# Patient Record
Sex: Male | Born: 1950 | Race: White | Hispanic: No | Marital: Married | Smoking: Never smoker
Health system: Southern US, Community
[De-identification: ages and names within clinical notes are randomized; demographics above are authoritative.]

## PROBLEM LIST (undated history)

## (undated) DIAGNOSIS — C439 Malignant melanoma of skin, unspecified: Secondary | ICD-10-CM

## (undated) DIAGNOSIS — Z9889 Other specified postprocedural states: Secondary | ICD-10-CM

## (undated) DIAGNOSIS — I219 Acute myocardial infarction, unspecified: Secondary | ICD-10-CM

## (undated) DIAGNOSIS — T4145XA Adverse effect of unspecified anesthetic, initial encounter: Secondary | ICD-10-CM

## (undated) DIAGNOSIS — K5730 Diverticulosis of large intestine without perforation or abscess without bleeding: Secondary | ICD-10-CM

## (undated) DIAGNOSIS — E785 Hyperlipidemia, unspecified: Secondary | ICD-10-CM

## (undated) DIAGNOSIS — I2510 Atherosclerotic heart disease of native coronary artery without angina pectoris: Secondary | ICD-10-CM

## (undated) DIAGNOSIS — K219 Gastro-esophageal reflux disease without esophagitis: Secondary | ICD-10-CM

## (undated) DIAGNOSIS — I10 Essential (primary) hypertension: Secondary | ICD-10-CM

## (undated) DIAGNOSIS — R112 Nausea with vomiting, unspecified: Secondary | ICD-10-CM

## (undated) DIAGNOSIS — T8859XA Other complications of anesthesia, initial encounter: Secondary | ICD-10-CM

## (undated) DIAGNOSIS — IMO0001 Reserved for inherently not codable concepts without codable children: Secondary | ICD-10-CM

## (undated) HISTORY — PX: MELANOMA EXCISION: SHX5266

## (undated) HISTORY — DX: Essential (primary) hypertension: I10

## (undated) HISTORY — DX: Atherosclerotic heart disease of native coronary artery without angina pectoris: I25.10

## (undated) HISTORY — DX: Hyperlipidemia, unspecified: E78.5

## (undated) HISTORY — DX: Malignant melanoma of skin, unspecified: C43.9

## (undated) HISTORY — PX: OTHER SURGICAL HISTORY: SHX169

## (undated) HISTORY — DX: Diverticulosis of large intestine without perforation or abscess without bleeding: K57.30

## (undated) HISTORY — DX: Gastro-esophageal reflux disease without esophagitis: K21.9

## (undated) HISTORY — DX: Acute myocardial infarction, unspecified: I21.9

## (undated) HISTORY — PX: COLONOSCOPY: SHX174

---

## 2000-11-22 ENCOUNTER — Emergency Department (HOSPITAL_COMMUNITY): Admission: EM | Admit: 2000-11-22 | Discharge: 2000-11-22 | Payer: Self-pay

## 2002-12-03 DIAGNOSIS — I219 Acute myocardial infarction, unspecified: Secondary | ICD-10-CM

## 2002-12-03 HISTORY — PX: PTCA: SHX146

## 2002-12-03 HISTORY — DX: Acute myocardial infarction, unspecified: I21.9

## 2003-06-21 ENCOUNTER — Encounter (HOSPITAL_COMMUNITY): Admission: RE | Admit: 2003-06-21 | Discharge: 2003-09-19 | Payer: Self-pay | Admitting: Cardiology

## 2004-10-20 ENCOUNTER — Ambulatory Visit: Payer: Self-pay

## 2004-10-25 ENCOUNTER — Ambulatory Visit: Payer: Self-pay | Admitting: Cardiology

## 2005-04-17 ENCOUNTER — Ambulatory Visit: Payer: Self-pay | Admitting: Cardiology

## 2005-04-23 ENCOUNTER — Ambulatory Visit: Payer: Self-pay | Admitting: Cardiology

## 2005-07-18 ENCOUNTER — Ambulatory Visit: Payer: Self-pay

## 2005-07-23 ENCOUNTER — Ambulatory Visit: Payer: Self-pay | Admitting: Cardiology

## 2005-08-03 ENCOUNTER — Ambulatory Visit: Payer: Self-pay | Admitting: Cardiology

## 2006-01-23 ENCOUNTER — Ambulatory Visit: Payer: Self-pay

## 2006-02-14 ENCOUNTER — Ambulatory Visit: Payer: Self-pay | Admitting: Cardiology

## 2006-04-25 ENCOUNTER — Ambulatory Visit: Payer: Self-pay | Admitting: Pulmonary Disease

## 2006-05-14 ENCOUNTER — Ambulatory Visit: Payer: Self-pay | Admitting: Gastroenterology

## 2006-08-22 ENCOUNTER — Ambulatory Visit: Payer: Self-pay | Admitting: Cardiology

## 2006-08-28 ENCOUNTER — Ambulatory Visit: Payer: Self-pay | Admitting: Cardiology

## 2007-08-08 ENCOUNTER — Ambulatory Visit: Payer: Self-pay | Admitting: Cardiology

## 2007-08-08 LAB — CONVERTED CEMR LAB
AST: 21 units/L (ref 0–37)
Bilirubin, Direct: 0.1 mg/dL (ref 0.0–0.3)
Chloride: 104 meq/L (ref 96–112)
Cholesterol: 113 mg/dL (ref 0–200)
Creatinine, Ser: 1 mg/dL (ref 0.4–1.5)
GFR calc non Af Amer: 82 mL/min
Glucose, Bld: 98 mg/dL (ref 70–99)
HDL: 40.5 mg/dL (ref >39.0)
LDL Cholesterol: 61 mg/dL (ref 0–99)
Sodium: 139 meq/L (ref 135–145)
Total Bilirubin: 1 mg/dL (ref 0.3–1.2)
Total Protein: 6.9 g/dL (ref 6.0–8.3)

## 2007-08-12 ENCOUNTER — Ambulatory Visit: Payer: Self-pay | Admitting: Cardiology

## 2007-10-15 ENCOUNTER — Ambulatory Visit: Payer: Self-pay | Admitting: Pulmonary Disease

## 2007-10-15 LAB — CONVERTED CEMR LAB
AST: 25 units/L (ref 0–37)
Basophils Relative: 0.6 % (ref 0.0–1.0)
Bilirubin, Direct: 0.1 mg/dL (ref 0.0–0.3)
CO2: 32 meq/L (ref 19–32)
Chloride: 106 meq/L (ref 96–112)
Cholesterol: 190 mg/dL (ref 0–200)
Eosinophils Absolute: 0.2 10*3/uL (ref 0.0–0.6)
Eosinophils Relative: 3.2 % (ref 0.0–5.0)
GFR calc Af Amer: 111 mL/min
GFR calc non Af Amer: 92 mL/min
Glucose, Bld: 107 mg/dL — ABNORMAL HIGH (ref 70–99)
HCT: 46.3 % (ref 39.0–52.0)
Leukocytes, UA: NEGATIVE
Lymphocytes Relative: 27.1 % (ref 12.0–46.0)
MCV: 85.6 fL (ref 78.0–100.0)
Neutro Abs: 4.2 10*3/uL (ref 1.4–7.7)
Neutrophils Relative %: 60.4 % (ref 43.0–77.0)
Nitrite: NEGATIVE
PSA: 1.28 ng/mL (ref 0.10–4.00)
Sodium: 143 meq/L (ref 135–145)
Total Protein: 7.5 g/dL (ref 6.0–8.3)
Urobilinogen, UA: 0.2 (ref 0.0–1.0)
WBC: 6.8 10*3/uL (ref 4.5–10.5)

## 2008-04-12 ENCOUNTER — Telehealth: Payer: Self-pay | Admitting: Pulmonary Disease

## 2008-04-29 ENCOUNTER — Ambulatory Visit: Payer: Self-pay | Admitting: Cardiology

## 2008-04-29 LAB — CONVERTED CEMR LAB
ALT: 18 units/L (ref 0–53)
AST: 22 units/L (ref 0–37)
Alkaline Phosphatase: 60 units/L (ref 39–117)
Bilirubin, Direct: 0.1 mg/dL (ref 0.0–0.3)
CO2: 30 meq/L (ref 19–32)
Chloride: 104 meq/L (ref 96–112)
Creatinine, Ser: 1.1 mg/dL (ref 0.4–1.5)
GFR calc non Af Amer: 73 mL/min
LDL Cholesterol: 67 mg/dL (ref 0–99)
Potassium: 4.2 meq/L (ref 3.5–5.1)
Sodium: 141 meq/L (ref 135–145)
Total Bilirubin: 1 mg/dL (ref 0.3–1.2)
Total CHOL/HDL Ratio: 3.1
Triglycerides: 50 mg/dL (ref 0–149)

## 2008-05-04 ENCOUNTER — Ambulatory Visit: Payer: Self-pay | Admitting: Cardiology

## 2008-07-13 ENCOUNTER — Ambulatory Visit: Payer: Self-pay | Admitting: Cardiology

## 2008-07-13 LAB — CONVERTED CEMR LAB
ALT: 21 units/L (ref 0–53)
Bilirubin, Direct: 0.1 mg/dL (ref 0.0–0.3)
HDL: 45.6 mg/dL (ref >39.0)
LDL Cholesterol: 68 mg/dL (ref 0–99)
Total Bilirubin: 0.9 mg/dL (ref 0.3–1.2)
Total CHOL/HDL Ratio: 2.7
VLDL: 9 mg/dL (ref 0–40)

## 2009-05-05 ENCOUNTER — Ambulatory Visit: Payer: Self-pay | Admitting: Cardiology

## 2009-05-05 LAB — CONVERTED CEMR LAB
ALT: 18 units/L (ref 0–53)
Alkaline Phosphatase: 66 units/L (ref 39–117)
Bilirubin, Direct: 0.2 mg/dL (ref 0.0–0.3)
LDL Cholesterol: 47 mg/dL (ref 0–99)
Total Bilirubin: 1.2 mg/dL (ref 0.3–1.2)
Total CHOL/HDL Ratio: 2
Total Protein: 7.3 g/dL (ref 6.0–8.3)
Triglycerides: 50 mg/dL (ref 0.0–149.0)

## 2009-05-07 DIAGNOSIS — Z85820 Personal history of malignant melanoma of skin: Secondary | ICD-10-CM | POA: Insufficient documentation

## 2009-05-07 DIAGNOSIS — I10 Essential (primary) hypertension: Secondary | ICD-10-CM | POA: Insufficient documentation

## 2009-05-07 DIAGNOSIS — I2510 Atherosclerotic heart disease of native coronary artery without angina pectoris: Secondary | ICD-10-CM | POA: Insufficient documentation

## 2009-05-09 ENCOUNTER — Ambulatory Visit: Payer: Self-pay | Admitting: Cardiology

## 2010-07-26 ENCOUNTER — Ambulatory Visit: Payer: Self-pay | Admitting: Cardiology

## 2010-07-27 LAB — CONVERTED CEMR LAB
AST: 27 units/L (ref 0–37)
Albumin: 3.7 g/dL (ref 3.5–5.2)
Alkaline Phosphatase: 67 units/L (ref 39–117)
CO2: 29 meq/L (ref 19–32)
Calcium: 9.2 mg/dL (ref 8.4–10.5)
Chloride: 107 meq/L (ref 96–112)
Cholesterol: 144 mg/dL (ref 0–200)
Creatinine, Ser: 0.9 mg/dL (ref 0.4–1.5)
HDL: 48.2 mg/dL (ref >39.00)
LDL Cholesterol: 86 mg/dL (ref 0–99)
Sodium: 143 meq/L (ref 135–145)
Total CHOL/HDL Ratio: 3
Total Protein: 6.8 g/dL (ref 6.0–8.3)
Triglycerides: 47 mg/dL (ref 0.0–149.0)

## 2010-08-01 LAB — CONVERTED CEMR LAB: PSA: 1.43 ng/mL (ref 0.10–4.00)

## 2010-08-02 ENCOUNTER — Ambulatory Visit: Payer: Self-pay | Admitting: Cardiology

## 2010-08-21 ENCOUNTER — Telehealth (INDEPENDENT_AMBULATORY_CARE_PROVIDER_SITE_OTHER): Payer: Self-pay | Admitting: *Deleted

## 2010-08-21 DIAGNOSIS — Z87448 Personal history of other diseases of urinary system: Secondary | ICD-10-CM | POA: Insufficient documentation

## 2010-08-22 ENCOUNTER — Telehealth (INDEPENDENT_AMBULATORY_CARE_PROVIDER_SITE_OTHER): Payer: Self-pay | Admitting: *Deleted

## 2010-08-23 ENCOUNTER — Encounter: Payer: Self-pay | Admitting: Pulmonary Disease

## 2010-08-31 ENCOUNTER — Ambulatory Visit: Payer: Self-pay | Admitting: Cardiology

## 2010-08-31 ENCOUNTER — Ambulatory Visit: Payer: Self-pay

## 2010-10-31 ENCOUNTER — Encounter: Payer: Self-pay | Admitting: Gastroenterology

## 2011-01-02 NOTE — Assessment & Plan Note (Signed)
Summary: f1y per pt call/lg      Allergies Added: NKDA  Visit Type:  Follow-up Primary Provider:  Dr. Kriste Basque  CC:  CAD.  History of Present Illness: The patient presents for one year follow up.  Since I last saw him he has had no new cardiovascular complaints.  He is not exercising as much as I would like.  However, with his usual activity he denies chest pain, neck or arm discomfort.  He has no SOB, PND or orthopnea.  He has had no swelling or weight gain.    Current Medications (verified): 1)  Lipitor 20 Mg Tabs (Atorvastatin Calcium) .... 1/2 By Mouth Daily 2)  Aspirin 325 Mg Tabs (Aspirin) .... Take One Tablet  By Mouth Once Daily 3)  Ramipril 2.5 Mg Caps (Ramipril) .... Take One Tablet By Mouth Once Daily 4)  Niaspan 1000 Mg Cr-Tabs (Niacin (Antihyperlipidemic)) .Marland Kitchen.. 1 1/2 Daily  Allergies (verified): No Known Drug Allergies  Past History:  Past Medical History: Reviewed history from 05/07/2009 and no changes required. DIVERTICULOSIS, COLON, HX OF (ICD-V12.79) MYOCARDIAL INFARCTION, HX OF (ICD-412) HYPERTENSION (ICD-401.9) MELANOMA, HX OF (ICD-V10.82) DYSLIPIDEMIA (ICD-272.4) CORONARY ARTERY DISEASE (ICD-414.00) (occluded right coronary artery at     [facility] in 2004 treated with stenting of the right     coronary artery with a downstream 75% stenosis as well.  He had two     drug-eluting stents placed.  Circumflex had 25% stenosis).     Past Surgical History: Reviewed history from 05/07/2009 and no changes required. PERCUTANEOUS TRANSLUMINAL CORONARY ANGIOPLASTY, HX OF (ICD-V45.82)  Review of Systems       As stated in the HPI and negative for all other systems.   Vital Signs:  Patient profile:   60 year old male Height:      71 inches Weight:      190 pounds BMI:     26.60 Pulse rate:   69 / minute Resp:     16 per minute BP sitting:   100 / 66  (right arm)  Vitals Entered By: Marrion Coy, CNA (August 02, 2010 12:12 PM)  Physical  Exam  General:  Well developed, well nourished, in no acute distress. Head:  normocephalic and atraumatic Mouth:  Teeth, gums and palate normal. Oral mucosa normal. Neck:  Neck supple, no JVD. No masses, thyromegaly or abnormal cervical nodes. Chest Wall:  no deformities or breast masses noted Lungs:  Clear bilaterally to auscultation and percussion. Abdomen:  Bowel sounds positive; abdomen soft and non-tender without masses, organomegaly, or hernias noted. No hepatosplenomegaly. Msk:  Back normal, normal gait. Muscle strength and tone normal. Extremities:  No clubbing or cyanosis. Neurologic:  Alert and oriented x 3. Skin:  Intact without lesions or rashes. Cervical Nodes:  no significant adenopathy Axillary Nodes:  no significant adenopathy Inguinal Nodes:  no significant adenopathy Psych:  Normal affect.   Detailed Cardiovascular Exam  Neck    Carotids: Carotids full and equal bilaterally without bruits.      Neck Veins: Normal, no JVD.  Normal, no JVD.    Heart    Inspection: no deformities or lifts noted.  no deformities or lifts noted.      Palpation: normal PMI with no thrills palpable.  normal PMI with no thrills palpable.      Auscultation: regular rate and rhythm, S1, S2 without murmurs, rubs, gallops, or clicks.  regular rate and rhythm, S1, S2 without murmurs, rubs, gallops, or clicks.    Vascular  Abdominal Aorta: no palpable masses, pulsations, or audible bruits.  no palpable masses, pulsations, or audible bruits.      Femoral Pulses: normal femoral pulses bilaterally.  normal femoral pulses bilaterally.      Pedal Pulses: normal pedal pulses bilaterally.  normal pedal pulses bilaterally.      Radial Pulses: normal radial pulses bilaterally.  normal radial pulses bilaterally.      Peripheral Circulation: no clubbing, cyanosis, or edema noted with normal capillary refill.  no clubbing, cyanosis, or edema noted with normal capillary refill.     EKG  Procedure  date:  08/02/2010  Findings:      Normal sinus rhythm, No acute ST T wave changes.  Impression & Recommendations:  Problem # 1:  MYOCARDIAL INFARCTION, HX OF (ICD-412) It has been several years since his MI.  He is not exercising as  much as I would like.  I would like to screen him with an exercise treadmill test.  He will continue with risk reduction. Orders: EKG w/ Interpretation (93000) Treadmill (Treadmill)  Problem # 2:  HYPERTENSION (ICD-401.9) His blood pressure is controlled.  He will continue with the meds as listed.  Problem # 3:  DYSLIPIDEMIA (ICD-272.4) He had an excellent lipid profile.  He will continue with the meds as listed.  Patient Instructions: 1)  Your physician recommends that you schedule a follow-up appointment at the time of your treadmill 2)  Your physician recommends that you continue on your current medications as directed. Please refer to the Current Medication list given to you today. 3)  Your physician has requested that you have an exercise tolerance test.  For further information please visit [email or web address]  Please also follow instruction sheet, as given.

## 2011-01-02 NOTE — Letter (Signed)
Summary: Colonoscopy Letter  [facility]  [address]   [place], [place] [address]   Phone: [phone]  Fax: [phone]      October 31, 2010 MRN: [id number]   Juan [name] [address] [place], [place]  [address]   Dear [name],   According to your medical record, it is time for you to schedule a Colonoscopy. The American Cancer Society recommends this procedure as a method to detect early colon cancer. Patients with a family history of colon cancer, or a personal history of colon polyps or inflammatory bowel disease are at increased risk.  This letter has been generated based on the recommendations made at the time of your procedure. If you feel that in your particular situation this may no longer apply, please contact our office.  Please call our office at [phone] to schedule this appointment or to update your records at your earliest convenience.  Thank you for cooperating with [place] to provide you with the very best care possible.   Sincerely,   Barbette Hair. Arlyce Dice, M.D.  [facility] [phone]

## 2011-01-02 NOTE — Miscellaneous (Signed)
Summary: Orders Update  Clinical Lists Changes  Problems: Added new problem of SCREENING OTHER&UNSPEC GENITOURINARY CONDITION (ICD-V81.6) Orders: Added new Test order of TLB-PSA (Prostate Specific Antigen) (84153-PSA) - Signed

## 2011-01-02 NOTE — Letter (Signed)
Summary: [facility] - Lip[facility] Main [facility]  [address]   [place], [place] [address]   Phone: [phone]  Fax: [phone]         August 02, 2010 MRN: [id number]     Juan [name] [address] [place], [place]  [address]     Dear Juan [name],  We have reviewed your cholesterol results.  They are as follows:     Total Cholesterol:    144 (Desirable: less than 200)       HDL  Cholesterol:     48.20  (Desirable: greater than 40 for men and 50 for women)       LDL Cholesterol:       86  (Desirable: less than 100 for low risk and less than 70 for moderate to high risk)       Triglycerides:       47.0  (Desirable: less than 150)  Our recommendations include: No changes, copntinue current medications   Call our office at the number listed above if you have any questions.  Lowering your LDL cholesterol is important, but it is only one of a large number of "risk factors" that may indicate that you are at risk for heart disease, stroke or other complications of hardening of the arteries.  Other risk factors include:   A.  Cigarette Smoking* B.  High Blood Pressure* C.  Obesity* D.   Low HDL Cholesterol (see yours above)* E.   Diabetes Mellitus (higher risk if your is uncontrolled) F.  Family history of premature heart disease G.  Previous history of stroke or cardiovascular disease          *These are risk factors YOU HAVE CONTROL OVER.  For more information, visit .  There is now evidence that lowering the TOTAL CHOLESTEROL AND LDL CHOLESTEROL can reduce the risk of heart disease.  The American Heart Association recommends the following guidelines for the treatment of elevated cholesterol:  1.  If there is now current heart disease and less than two risk factors, TOTAL CHOLESTEROL should be less than 200 and LDL CHOLESTEROL should be less than 100. 2.  If there is current heart disease or two or more risk factors, TOTAL CHOLESTEROL should be  less than 200 and LDL CHOLESTEROL should be less than 70.  A diet low in cholesterol, saturated fat, and calories is the cornerstone of treatment for elevated cholesterol.  Cessation of smoking and exercise are also important in the management of elevated cholesterol and preventing vascular disease.  Studies have shown that 30 to 60 minutes of physical activity most days can help lower blood pressure, lower cholesterol, and keep your weight at a healthy level.  Drug therapy is used when cholesterol levels do not respond to therapeutic lifestyle changes (smoking cessation, diet, and exercise) and remains unacceptably high.  If medication is started, it is important to have you levels checked periodically to evaluate the need for further treatment options.      Thank you,     Sander Nephew, RN for Dr Rollene Rotunda [facility] Team

## 2011-01-02 NOTE — Letter (Signed)
Summary: [facility]  [facility]   Imported By: Lennie Odor 08/30/2010 14:28:19  _____________________________________________________________________  External Attachment:    Type:   Image     Comment:   External Document

## 2011-01-02 NOTE — Progress Notes (Signed)
Summary: urolgy referral/ change request  Phone Note Call from Patient   Caller: Patient Call For: Juan [name] Summary of Call: pt request to change dr's re: recent referral to dr Vonita Moss at [facility] urology. since he hasn't been seen yet (appt is this fri at 11:00am) he would like to see dr humphries since his wife has seen him. pt # [id number] Initial call taken by: Tivis Ringer, CNA,  August 22, 2010 9:33 AM  Follow-up for Phone Call        called and spoke with pt.  order was sent yesterday to [facility] for Urology referral.  Pt requesting a different physician than the one he currently has an appt to see (Dr. Vonita Moss) .  Will forward message to [facility] to see if pt can be scheduled with Dr. Wanda Plump instead.  Aundra Millet Reynolds LPN  August 22, 2010 9:40 AM   Additional Follow-up for Phone Call Additional follow up Details #1::        dr humphries no longer in practice so pt chose to stay with his appt to see dr Vonita Moss 08/23/10@11 :00am  Additional Follow-up by: Oneita Jolly,  August 22, 2010 9:59 AM

## 2011-01-02 NOTE — Progress Notes (Signed)
Summary: urology referral  Phone Note Call from Patient Call back at [phone]   Caller: Patient Call For: nadel Summary of Call: pt would like referral to see urologist Initial call taken by: Rickard Patience,  August 21, 2010 9:54 AM  Follow-up for Phone Call        Pt last seen SN 11.2008  He is requesting urology referral d/t having blood in urine over the weekend.  States since Sunday morning he has not had anymore blood.  Blood was accompanied with slight burning but no pain, f/c/s.  Dr. Kriste Basque, pls advise.  Thanks!  Follow-up by: Gweneth Dimitri RN,  August 21, 2010 10:38 AM  Additional Follow-up for Phone Call Additional follow up Details #1::        per SN---ok to refer to urology....order has been placed in emr for this. Randell Loop CMA  August 21, 2010 3:11 PM   New Problems: HEMATURIA, HX OF (ICD-V13.09)   Additional Follow-up for Phone Call Additional follow up Details #2::    called and spoke with pt.  pt aware SN sent order to [facility] for Urology referall and that someone from our office will be contacting him about an appt date and time with a Urologist. Arman Filter LPN  August 21, 2010 3:20 PM   New Problems: HEMATURIA, HX OF (ICD-V13.09)

## 2011-01-15 ENCOUNTER — Telehealth: Payer: Self-pay | Admitting: Cardiology

## 2011-01-16 ENCOUNTER — Encounter: Payer: Self-pay | Admitting: Physician Assistant

## 2011-01-16 ENCOUNTER — Ambulatory Visit (INDEPENDENT_AMBULATORY_CARE_PROVIDER_SITE_OTHER): Payer: BC Managed Care – PPO | Admitting: Physician Assistant

## 2011-01-16 ENCOUNTER — Observation Stay (HOSPITAL_COMMUNITY)
Admission: AD | Admit: 2011-01-16 | Discharge: 2011-01-17 | DRG: 854 | Disposition: A | Discharge status: Home / Self Care | Payer: BC Managed Care – PPO | Source: Ambulatory Visit | Attending: Cardiovascular Disease | Admitting: Cardiovascular Disease

## 2011-01-16 DIAGNOSIS — I252 Old myocardial infarction: Secondary | ICD-10-CM

## 2011-01-16 DIAGNOSIS — I2510 Atherosclerotic heart disease of native coronary artery without angina pectoris: Secondary | ICD-10-CM

## 2011-01-16 DIAGNOSIS — I200 Unstable angina: Secondary | ICD-10-CM | POA: Diagnosis present

## 2011-01-16 DIAGNOSIS — K5730 Diverticulosis of large intestine without perforation or abscess without bleeding: Secondary | ICD-10-CM | POA: Diagnosis present

## 2011-01-16 DIAGNOSIS — I10 Essential (primary) hypertension: Secondary | ICD-10-CM | POA: Diagnosis present

## 2011-01-16 DIAGNOSIS — Z9861 Coronary angioplasty status: Secondary | ICD-10-CM

## 2011-01-16 DIAGNOSIS — Z01810 Encounter for preprocedural cardiovascular examination: Secondary | ICD-10-CM | POA: Insufficient documentation

## 2011-01-16 DIAGNOSIS — M79609 Pain in unspecified limb: Secondary | ICD-10-CM

## 2011-01-16 DIAGNOSIS — E785 Hyperlipidemia, unspecified: Secondary | ICD-10-CM | POA: Diagnosis present

## 2011-01-16 DIAGNOSIS — Z01812 Encounter for preprocedural laboratory examination: Secondary | ICD-10-CM | POA: Insufficient documentation

## 2011-01-16 DIAGNOSIS — Z7982 Long term (current) use of aspirin: Secondary | ICD-10-CM

## 2011-01-16 LAB — COMPREHENSIVE METABOLIC PANEL
ALT: 19 U/L (ref 0–53)
BUN: 11 mg/dL (ref 6–23)
BUN: 10 mg/dL (ref 6–23)
CO2: 26 mEq/L (ref 19–32)
CO2: 28 mEq/L (ref 19–32)
Calcium: 8.1 mg/dL — ABNORMAL LOW (ref 8.4–10.5)
Chloride: 106 mEq/L (ref 96–112)
Creatinine, Ser: 0.99 mg/dL (ref 0.4–1.5)
GFR calc non Af Amer: >60 mL/min (ref >60)
GFR calc non Af Amer: >60 mL/min (ref >60)
Glucose, Bld: 98 mg/dL (ref 70–99)
Sodium: 135 mEq/L (ref 135–145)
Total Bilirubin: 0.6 mg/dL (ref 0.3–1.2)

## 2011-01-16 LAB — PROTIME-INR
INR: 7.07 (ref 0.00–1.49)
INR: 1.23 (ref 0.00–1.49)
Prothrombin Time: 60.3 seconds — ABNORMAL HIGH (ref 11.6–15.2)

## 2011-01-16 LAB — CBC
HCT: 38.9 % — ABNORMAL LOW (ref 39.0–52.0)
HCT: 40.1 % (ref 39.0–52.0)
Hemoglobin: 12.9 g/dL — ABNORMAL LOW (ref 13.0–17.0)
MCHC: 33.2 g/dL (ref 30.0–36.0)
Platelets: 245 10*3/uL (ref 150–400)
RBC: 4.76 MIL/uL (ref 4.22–5.81)
RDW: 13.6 % (ref 11.5–15.5)
RDW: 13.6 % (ref 11.5–15.5)
WBC: 8.5 10*3/uL (ref 4.0–10.5)
WBC: 10.1 10*3/uL (ref 4.0–10.5)

## 2011-01-16 LAB — CARDIAC PANEL(CRET KIN+CKTOT+MB+TROPI)
CK, MB: 1.3 ng/mL (ref 0.3–4.0)
Relative Index: INVALID (ref 0.0–2.5)
Troponin I: 0.01 ng/mL (ref 0.00–0.06)
Troponin I: 0.01 ng/mL (ref 0.00–0.06)

## 2011-01-16 LAB — POCT ACTIVATED CLOTTING TIME: Activated Clotting Time: 476 seconds

## 2011-01-16 LAB — MRSA PCR SCREENING: MRSA by PCR: NEGATIVE

## 2011-01-17 ENCOUNTER — Inpatient Hospital Stay (HOSPITAL_COMMUNITY): Payer: BC Managed Care – PPO

## 2011-01-17 DIAGNOSIS — I200 Unstable angina: Secondary | ICD-10-CM

## 2011-01-17 LAB — BASIC METABOLIC PANEL
BUN: 11 mg/dL (ref 6–23)
GFR calc Af Amer: >60 mL/min (ref >60)
GFR calc non Af Amer: >60 mL/min (ref >60)
Potassium: 3.9 mEq/L (ref 3.5–5.1)
Sodium: 143 mEq/L (ref 135–145)

## 2011-01-17 LAB — CARDIAC PANEL(CRET KIN+CKTOT+MB+TROPI)
CK, MB: 1.6 ng/mL (ref 0.3–4.0)
Total CK: 87 U/L (ref 7–232)
Troponin I: 0.05 ng/mL (ref 0.00–0.06)

## 2011-01-17 LAB — CBC
Platelets: 230 10*3/uL (ref 150–400)
RDW: 13.7 % (ref 11.5–15.5)
WBC: 9.3 10*3/uL (ref 4.0–10.5)

## 2011-01-17 LAB — LIPID PANEL: HDL: 34 mg/dL — ABNORMAL LOW (ref >39)

## 2011-01-18 NOTE — Procedures (Signed)
NAMEXZAVIER, SWINGER                 ACCOUNT NO.:  [id number]  MEDICAL RECORD NO.:  [id number]           PATIENT TYPE:  I  LOCATION:  2920                         FACILITY:  [facility]  PHYSICIAN:  Verne Carrow, MDDATE OF BIRTH:  04-12-1951  DATE OF PROCEDURE:  01/16/2011 DATE OF DISCHARGE:                           CARDIAC CATHETERIZATION   PRIMARY CARDIOLOGIST:  Rollene Rotunda, MD, [facility].  PRIMARY CARE PHYSICIAN:  Lonzo Cloud. Kriste Basque, MD  PROCEDURE PERFORMED: 1. Left heart catheterization. 2. Selective coronary angiography. 3. Left ventricular angiogram. 4. PTCA with placement of a single drug-eluting stent in the mid right     coronary artery.  OPERATOR:  Verne Carrow, MD.  ARTERIAL ACCESS SITE:  Right radial artery.  INDICATIONS:  This is a 60 year old Caucasian male with a history of coronary artery disease, who underwent placement of overlapping drug- eluting stents in the proximal and mid right coronary artery in 2004 at the time of an inferior myocardial infarction.  The patient also has a history of hypertension and dyslipidemia.  He presented to our office today with complaints of burning chest discomfort.  His EKG showed subtle ST-segment elevation in the inferior leads.  Based on the abnormal EKG, he was brought to the cardiac catheterization laboratory for an urgent cardiac catheterization.  DETAILS OF PROCEDURE:  The patient was brought to the main cardiac catheterization laboratory after signing informed consent for the procedure.  An Freida Busman test was performed on the right wrist and was positive.  The right wrist was prepped and draped in sterile fashion.  A 1% lidocaine was used for local anesthesia.  A 5-French sheath was inserted into the right radial artery without difficulty.  A 3 mg of verapamil was given through the sheath, 4000 units of intravenous heparin was given after sheath insertion.  Standard diagnostic catheters were used to perform  selective coronary angiography.  A pigtail catheter was used across the aortic valve into the left ventricle.  A left ventricular angiogram was performed in the RAO projection.  The patient tolerated the diagnostic portion of the procedure well.  We elected to proceed intervention of the severe stenosis in the mid right coronary artery.  The sheath was upsized to a 6-French sheath in the right radial artery.  The patient was given a bolus of Angiomax and a drip was started.  The patient was given 60 mg of Effient x1.  When the ACT was greater than 200, we engage the native right coronary artery with a 6- [place] JR-4 guiding catheter.  A Cougar intracoronary wire was passed down the length of the vessel into the distal vessel.  A 2.5 x 12-mm balloon was inflated in the area of tightest stenosis.  A 3.0 x 15-mm Endeavor drug-eluting stent was carefully positioned in the mid vessel and was deployed without difficulty.  A 3.25 x 12-mm noncompliant balloon was positioned inside the stent and was inflated to 14 atmospheres.  The stenosis was taken from 90% down to 0%.  There was excellent flow into the distal vessel at the conclusion of the case. There was no evidence of dissection of the vessel.  There were no immediate complications.  The patient had no chest pain at the conclusion of the case.  He was taken to the step-down ICU in stable condition.  Of note, the sheath was removed from the right radial artery and a Terumo hemostasis band was applied over the arteriotomy site.  HEMODYNAMIC FINDINGS:  Central aortic pressure, 105/60.  Left ventricular pressure, 102/7/12.  ANGIOGRAPHIC FINDINGS: 1. The left main coronary artery had no evidence of disease. 2. The left anterior descending was a large-caliber vessel that     coursed to the apex and gave off two moderate-sized diagonal     branches.  There were minor luminal irregularities in this vessel,     but no obstructive lesions. 3. The  circumflex artery gave off an early obtuse marginal branch that     had no disease.  There was a moderate-sized second obtuse marginal     branch with a tubular 50% stenosis. 4. The right coronary artery is a large dominant vessel with stents     present in the proximal mid vessel that are patent with no evidence     of restenosis.  Just beyond the stented segment and the mid vessel,     there is a discrete 90% hazy stenosis.  The distal vessel has no     obstructive disease. 5. Left ventricular angiogram was performed in the RAO projection     showed normal left ventricular systolic function with ejection     fraction of 60% to 65%.  IMPRESSION: 1. Single-vessel coronary artery disease. 2. Successful percutaneous transluminal coronary angioplasty with     placement of a drug-eluting stent in the mid right coronary artery. 3. Normal left ventricular systolic function.  RECOMMENDATIONS:  The patient should be continued on aspirin 81 mg once daily, Effient 10 mg once daily, beta-blocker, and a statin.  He will be watched closely in the step-down ICU tonight.  If he is stable, then we could consider discharge to home tomorrow.     Verne Carrow, MD     CM/MEDQ  D:  01/16/2011  T:  01/17/2011  Job:  [id number]  cc:   Rollene Rotunda, MD, [facility]. Kriste Basque, MD  Electronically Signed by Verne Carrow MD on 01/18/2011 11:08:35 AM

## 2011-01-22 ENCOUNTER — Telehealth: Payer: Self-pay | Admitting: Cardiology

## 2011-01-23 ENCOUNTER — Encounter: Payer: Self-pay | Admitting: Cardiology

## 2011-01-24 ENCOUNTER — Encounter: Payer: Self-pay | Admitting: Internal Medicine

## 2011-01-24 ENCOUNTER — Encounter: Payer: Self-pay | Admitting: Cardiology

## 2011-01-24 ENCOUNTER — Encounter (INDEPENDENT_AMBULATORY_CARE_PROVIDER_SITE_OTHER): Payer: BC Managed Care – PPO | Admitting: Cardiology

## 2011-01-24 DIAGNOSIS — I2510 Atherosclerotic heart disease of native coronary artery without angina pectoris: Secondary | ICD-10-CM

## 2011-01-24 DIAGNOSIS — E785 Hyperlipidemia, unspecified: Secondary | ICD-10-CM

## 2011-01-24 DIAGNOSIS — I10 Essential (primary) hypertension: Secondary | ICD-10-CM

## 2011-01-24 NOTE — Assessment & Plan Note (Signed)
Summary: per triage note/lwb      Allergies Added: NKDA  Visit Type:  Follow-up Primary Provider:  Dr. Kriste Basque  CC:  shoulder pain/ numbness in left arm.  History of Present Illness: Primary Cardiologist:  Dr. Rollene Rotunda  Juan [name] is a 60 yo male with a h/o CAD, s/p MI in 2004.  He had DEX x 2 to the RCA.  Residual stenosis included a 75% dRCA and 25% CFX.  His last ETT was 08/31/2010 and his EKGs demonstrated no ischemic changes.  He called in with arm pain and was added on to my schedule today.  Last week, he started to note some burning in his throat.  This would come on at any time.  It is often associated with meals.  He also had a strange sensation in his left arm.  He cannot really describe it.  He states it just felt like he was more aware that he had a left arm.  He woke in the middle of the night recently with some left arm numbness.  He felt like he had slept on his arm.  With changes in positioning this got better.  However, given his history, he was concerned and stayed awake for a couple of hours.  He denies exertional chest pain or shortness of breath.  He denies exertional throat symptoms.  He is actually having some throat burning in the office right now.  When he had his heart attack in 2004, he had throat burning-type symptoms.  They were much worse.  He denies syncope.  He does have significant dysphagia.  He has to induce vomiting at times.  He was told at one point that he should have an endoscopy.  However, he deferred  Preventive Screening-Counseling & Management  Alcohol-Tobacco     Smoking Status: never  Current Medications (verified): 1)  Lipitor 20 Mg Tabs (Atorvastatin Calcium) .... 1/2 By Mouth Daily 2)  Aspirin 325 Mg Tabs (Aspirin) .... Take One Tablet  By Mouth Once Daily 3)  Ramipril 2.5 Mg Caps (Ramipril) .... Take One Tablet By Mouth Once Daily 4)  Niaspan 1000 Mg Cr-Tabs (Niacin (Antihyperlipidemic)) .Marland Kitchen.. 1 1/2 Daily  Allergies (verified): No  Known Drug Allergies  Past History:  Past Medical History: Last updated: 05/07/2009 DIVERTICULOSIS, COLON, HX OF (ICD-V12.79) MYOCARDIAL INFARCTION, HX OF (ICD-412) HYPERTENSION (ICD-401.9) MELANOMA, HX OF (ICD-V10.82) DYSLIPIDEMIA (ICD-272.4) CORONARY ARTERY DISEASE (ICD-414.00) (occluded right coronary artery at     [facility] in 2004 treated with stenting of the right     coronary artery with a downstream 75% stenosis as well.  He had two     drug-eluting stents placed.  Circumflex had 25% stenosis).     Family History: Reviewed history from 05/07/2009 and no changes required. Father:Died at age 10 of MI Mother:mother hix of cva, hypertension Siblings: 1 brother has hypertension  Social History: Full Time Married  1 daughter Tobacco Use - No.  Smoking Status:  never  Review of Systems       As per  the HPI.  All other systems reviewed and negative.   Vital Signs:  Patient profile:   60 year old male Height:      71 inches Weight:      195 pounds BMI:     27.30 Pulse rate:   75 / minute BP sitting:   104 / 60  (left arm) Cuff size:   regular  Vitals Entered By: Burnett Kanaris, CNA (January 16, 2011 3:31 PM)  Physical Exam  General:  Well nourished, well developed in no acute distress HEENT: Normal Neck: No JVD Cardiac:  Normal S1, S2; RRR; no murmur Lungs:  Clear to auscultation bilaterally, no wheezing, rhonchi or rales Abd: Soft, nontender, no hepatomegaly Ext: No  edema Vascular: No carotid  bruits Skin: Warm and dry MSK:  No deformities Lymph: No adenopathy Endocrine:  No thyromegaly Neuro: CNs 2-12 intact; nonfocal    EKG  Procedure date:  01/16/2011  Findings:      normal sinus rhythm Heart rate 75 Less than or equal to 1 mm of ST elevation in leads 2, 3, aVF  Impression & Recommendations:  Problem # 1:  CORONARY ARTERY DISEASE (ICD-414.00) He is having atypical symptoms with throat burning and arm discomfort.  But,  his EKG is abnormal.  He has some slight ST elevation in his inferior leads (less than a millimeter).  I discussed his case with Dr. Excell Seltzer who also saw the patient.  His symptoms are more consistent with a GI etiology.  However given his prior intervention to his RCA and where his EKG changes are, we plan to admit him for further evaluation.  We will plan on sending him to the Cath Lab today for cardiac catheterization to further evaluate.  If his anatomy looks stable, we will likely need to refer him to gastroenterology.  Problem # 2:  UNSTABLE ANGINA (ICD-411.1)  As above, he will be admitted today for cardiac catheterization.  His updated medication list for this problem includes:    Aspirin 325 Mg Tabs (Aspirin) .Marland Kitchen... Take one tablet  by mouth once daily    Ramipril 2.5 Mg Caps (Ramipril) .Marland Kitchen... Take one tablet by mouth once daily  Problem # 3:  HYPERTENSION (ICD-401.9)  His updated medication list for this problem includes:    Aspirin 325 Mg Tabs (Aspirin) .Marland Kitchen... Take one tablet  by mouth once daily    Ramipril 2.5 Mg Caps (Ramipril) .Marland Kitchen... Take one tablet by mouth once daily  His updated medication list for this problem includes:    Aspirin 325 Mg Tabs (Aspirin) .Marland Kitchen... Take one tablet  by mouth once daily    Ramipril 2.5 Mg Caps (Ramipril) .Marland Kitchen... Take one tablet by mouth once daily  Problem # 4:  DYSLIPIDEMIA (ICD-272.4)  His updated medication list for this problem includes:    Lipitor 20 Mg Tabs (Atorvastatin calcium) .Marland Kitchen... 1/2 by mouth daily    Niaspan 1000 Mg Cr-tabs (Niacin (antihyperlipidemic)) .Marland Kitchen... 1 1/2 daily  His updated medication list for this problem includes:    Lipitor 20 Mg Tabs (Atorvastatin calcium) .Marland Kitchen... 1/2 by mouth daily    Niaspan 1000 Mg Cr-tabs (Niacin (antihyperlipidemic)) .Marland Kitchen... 1 1/2 daily

## 2011-01-24 NOTE — Discharge Summary (Addendum)
NAMEDONALDSON, RICHTER                 ACCOUNT NO.:  [id number]  MEDICAL RECORD NO.:  [id number]           PATIENT TYPE:  I  LOCATION:  2920                         FACILITY:  [facility]  PHYSICIAN:  Veverly Fells. Excell Seltzer, MD  DATE OF BIRTH:  June 05, 1951  DATE OF ADMISSION:  01/16/2011 DATE OF DISCHARGE:  01/17/2011                              DISCHARGE SUMMARY   DISCHARGE DIAGNOSES: 1. Unstable angina in the form of arm discomfort/throat burning with     cardiac catheterization this admission, January 16, 2011,     demonstrating patent prior right coronary artery stent with 90%     stenosis just beyond the mid vessel, status post successful     percutaneous transluminal coronary angioplasty/drug-eluting stent     placement to the stenosis. 2. Prior history of coronary artery disease status post myocardial     infarction in 2004 with stenting x2 to the right coronary artery     with drug-eluting stents at that time. 3. Hypertension, controlled. 4. Dyslipidemia.  HOSPITAL COURSE:  Mr. Saenz is a 60 year old gentleman with prior known history of coronary artery disease who presented to the office as an add- on for complaints of left arm discomfort as well as throat burning.  He had had this approximately 2 days prior to admission and just overall felt that something was not right.  He denied any exertional chest pain or shortness of breath.  However, when he had his heart attack in 2004, he had throat burning type symptoms as well.  EKG showed slight ST elevation in his inferior leads less than a millimeter.  Dr. Excell Seltzer presented the case to Tereso Newcomer, PA-C and it was felt that the patient should undergo cardiac catheterization to define his coronary anatomy.  Risks, benefits, and alternatives were discussed with the patient who agreed to proceed.  Cardiac enzymes that were cycled on admission to the hospital were negative and remained negative throughout the course of his hospital stay.   He went directly to the cath lab where he was found to have the above findings with 90% stenosis of the RCA just beyond the stented segment in mid vessel.  His prior RCA stents were patent.  Otherwise, he had 50% tubular stenosis in the OM-2, but otherwise no obstructive disease.  His LV function was normal with an EF of 60-65%.  The patient did quite well and ambulated with Cardiac Rehab post procedurally.  Dr. Excell Seltzer was seen and examined him today and feels he is stable for discharge.  DISCHARGE LABORATORY DATA:  WBC 9.3, hemoglobin 12.4, hematocrit 38, and platelet count 230.  Sodium 143, potassium 3.9, chloride 109, CO2 31, glucose 100, BUN 11, and creatinine 0.94.  LFTs were within normal limits on January 16, 2011 with the exception of slightly decreased albumin of 3.2.  Cardiac enzymes were negative x3.  Total cholesterol 115, triglycerides 184, HDL 34, and LDL 44.  Please note, the patient had abnormal coags done last night with an INR of 7.07 and PT of 60.3. This was felt to be a lab error and was promptly redrawn showing a PT of  15.7 and an INR appropriately at 1.23.  DISCHARGE STUDIES: 1. Chest x-ray, January 17, 2011 showed no active lung disease. 2. Cardiac catheterization, January 16, 2011, please see above for     summary as well as full report for details.  DISCHARGE MEDICATIONS: 1. Metoprolol tartrate 25 mg 1/2 tablet b.i.d. 2. Nitroglycerin sublingual 0.4 mg every 5 minutes as needed up to 3     doses. 3. Effient 10 mg daily. 4. Aspirin 81 mg daily. 5. Lipitor 10 mg at bedtime. 6. Niaspan 1500 mg at bedtime. 7. Ramipril 2.5 mg at bedtime.  DISPOSITION:  Mr. Cross will be discharged in stable condition to home. He is not to return to work until Monday, January 22, 2011.  He is not to lift anything for 1 week, drive for 2 days, and not to participate in sexual activity for 1 week.  He is to follow a heart-healthy diet.  If he notices any pain, swelling,  bleeding or pus at the cath site, he is to call or return.  He will follow up with Dr. Antoine Poche on February 06, 2011 at 2:15 p.m. and was also given a referral for outpatient cardiac rehab.  DURATION OF DISCHARGE ENCOUNTER:  Greater than 30 minutes including physician and PA time.     Ronie Spies, P.A.C.   ______________________________ Veverly Fells. Excell Seltzer, MD    DD/MEDQ  D:  01/17/2011  T:  01/18/2011  Job:  [id number]  cc:   Rollene Rotunda, MD, [facility] Scott M. Kriste Basque, MD  Electronically Signed by Ronie Spies  on 01/24/2011 09:12:35 AM Electronically Signed by Tonny Bollman MD on 02/06/2011 08:55:40 PM

## 2011-01-24 NOTE — Progress Notes (Signed)
Summary:  left arm   Phone Note Call from Patient Call back at [phone]   Caller: Patient Reason for Call: Talk to Nurse Summary of Call: pt having a weird feeling in his left arm. pt wants to talk to a nurse. Initial call taken by: Roe Coombs,  January 15, 2011 9:05 AM  Follow-up for Phone Call        I spoke with the pt and for the past week he has had indigestion and a burning in throat every evening.  Yesterday the pt noticed a  "funny feeling" in his left arm which he cannot explain what arm feels like.  The pt denies CP and SOB.  The pt would like to see Dr Antoine Poche but he is out of the office this week.  I arranged an appt for the pt to see Scott PA-C on 01/16/11 at 3:00.  The pt was instructed to call the office if he had any change or worsening in his symptoms.  Pt agreed with plan.  Pt had normal GXT in September 2011.  Follow-up by: Julieta Gutting, RN, BSN,  January 15, 2011 11:01 AM

## 2011-01-26 ENCOUNTER — Encounter: Payer: Self-pay | Admitting: Cardiology

## 2011-01-30 NOTE — Miscellaneous (Signed)
Clinical Lists Changes  Observations: Added new observation of CARDCATHFIND: ANGIOGRAPHIC FINDINGS: 1. The left main coronary artery had no evidence of disease. 2. The left anterior descending was a large-caliber vessel that     coursed to the apex and gave off two moderate-sized diagonal     branches.  There were minor luminal irregularities in this vessel,     but no obstructive lesions. 3. The circumflex artery gave off an early obtuse marginal branch that     had no disease.  There was a moderate-sized second obtuse marginal     branch with a tubular 50% stenosis. 4. The right coronary artery is a large dominant vessel with stents     present in the proximal mid vessel that are patent with no evidence     of restenosis.  Just beyond the stented segment and the mid vessel,     there is a discrete 90% hazy stenosis.  The distal vessel has no     obstructive disease. 5. Left ventricular angiogram was performed in the RAO projection     showed normal left ventricular systolic function with ejection     fraction of 60% to 65%.   IMPRESSION: 1. Single-vessel coronary artery disease. 2. Successful percutaneous transluminal coronary angioplasty with     placement of a drug-eluting stent in the mid right coronary artery. 3. Normal left ventricular systolic function.   RECOMMENDATIONS:  The patient should be continued on aspirin 81 mg once daily, Effient 10 mg once daily, beta-blocker, and a statin.  He will be watched closely in the step-down ICU tonight.  If he is stable, then we could consider discharge to home tomorrow.       (01/17/2011 13:51)      Cardiac Cath  Procedure date:  01/17/2011  Findings:      ANGIOGRAPHIC FINDINGS: 1. The left main coronary artery had no evidence of disease. 2. The left anterior descending was a large-caliber vessel that     coursed to the apex and gave off two moderate-sized diagonal     branches.  There were minor luminal irregularities  in this vessel,     but no obstructive lesions. 3. The circumflex artery gave off an early obtuse marginal branch that     had no disease.  There was a moderate-sized second obtuse marginal     branch with a tubular 50% stenosis. 4. The right coronary artery is a large dominant vessel with stents     present in the proximal mid vessel that are patent with no evidence     of restenosis.  Just beyond the stented segment and the mid vessel,     there is a discrete 90% hazy stenosis.  The distal vessel has no     obstructive disease. 5. Left ventricular angiogram was performed in the RAO projection     showed normal left ventricular systolic function with ejection     fraction of 60% to 65%.   IMPRESSION: 1. Single-vessel coronary artery disease. 2. Successful percutaneous transluminal coronary angioplasty with     placement of a drug-eluting stent in the mid right coronary artery. 3. Normal left ventricular systolic function.   RECOMMENDATIONS:  The patient should be continued on aspirin 81 mg once daily, Effient 10 mg once daily, beta-blocker, and a statin.  He will be watched closely in the step-down ICU tonight.  If he is stable, then we could consider discharge to home tomorrow.

## 2011-01-30 NOTE — Progress Notes (Signed)
Summary: question re procedure done last week     recurrent S/S   Phone Note Call from Patient Call back at [phone]   Caller: Patient Reason for Call: Talk to Nurse Summary of Call: pt had cardiac catheterization done last week. pt has some question re procedure. Initial call taken by: Roe Coombs,  January 22, 2011 2:33 PM  Follow-up for Phone Call        per pt call - states he is still feeling some of the same things that he was before having stent placed 01/17/2011.  States some burning in his throat that started back last night and a slight "funny feeling" in his left arm that comes and goes.  Pt states the s/s are nothing like the intensity he was having but concerning because he does still have them somewhat.  He also c/o alot of fatigue and states he was taking his Metoprolol 25 mg two times a day instead of 1/2 tablet two times a day.  He has decreased this now and will hopefully feel an improvement in his fatigue.  Pt was given an appointment for f/u on Wed with Dr Antoine Poche.  He is instructed to call back if his s/s change or increase in intesity and/or frequency.  He states understanding Follow-up by: Charolotte Capuchin, RN,  January 22, 2011 3:05 PM

## 2011-01-30 NOTE — Assessment & Plan Note (Signed)
Summary: having some of the same s/s as before stent:  coming at 10am ...  Medications Added ASPIRIN 81 MG  TABS (ASPIRIN) 1 by mouth daily EFFIENT 10 MG TABS (PRASUGREL HCL) 1 by mouth daily METOPROLOL TARTRATE 25 MG TABS (METOPROLOL TARTRATE) 1/2 by mouth two times a day      Allergies Added: NKDA  Visit Type:  Follow-up Primary Provider:  Dr. Kriste [name]  CC:  CAD.  History of Present Illness: The patient presents for followup after recent hospitalization. He developed new onset unstable angina and was taken from this office urgently for catheterization. He was found to have a 90% RCA stenosis with patent prior stents in the same vessel. He underwent additional drug-eluting stent placement to the right coronary. He did not rule in for myocardial infarction.  Following discharge he was somewhat fatigued although in the last day he has felt better. He has had however a none of the throat discomfort or chest discomfort that was his recent angina. He denies any shortness of breath, PND or orthopnea. He has had no weight gain or edema. He has done a little activity around his yard.  Current Medications (verified): 1)  Lipitor 20 Mg Tabs (Atorvastatin Calcium) .... 1/2 By Mouth Daily 2)  Aspirin 81 Mg  Tabs (Aspirin) .Marland Kitchen.. 1 By Mouth Daily 3)  Ramipril 2.5 Mg Caps (Ramipril) .... Take One Tablet By Mouth Once Daily 4)  Niaspan 1000 Mg Cr-Tabs (Niacin (Antihyperlipidemic)) .Marland Kitchen.. 1 1/2 Daily 5)  Effient 10 Mg Tabs (Prasugrel Hcl) .Marland Kitchen.. 1 By Mouth Daily 6)  Metoprolol Tartrate 25 Mg Tabs (Metoprolol Tartrate) .... 1/2 By Mouth Two Times A Day  Allergies (verified): No Known Drug Allergies  Past History:  Past Medical History: DIVERTICULOSIS, COLON, HX OF (ICD-V12.79) MYOCARDIAL INFARCTION, HX OF (ICD-412) HYPERTENSION (ICD-401.9) MELANOMA, HX OF (ICD-V10.82) DYSLIPIDEMIA (ICD-272.4) CORONARY ARTERY DISEASE (ICD-414.00) (occluded right coronary artery at     [facility] in 2004  treated with stenting of the right     coronary artery with a downstream 75% stenosis as well.  He had two     drug-eluting stents placed.  Circumflex had 25% stenosis. Catheterization      January 16, 2011, demonstrating patent prior right coronary artery stent with 90%     stenosis just beyond the mid vessel, status post successful     percutaneous transluminal coronary angioplasty/drug-eluting stent     placement to the stenosis.).     Past Surgical History: Reviewed history from 05/07/2009 and no changes required. PERCUTANEOUS TRANSLUMINAL CORONARY ANGIOPLASTY, HX OF (ICD-V45.82)  Review of Systems       As stated in the HPI and negative for all other systems.   Vital Signs:  Patient profile:   60 year old male Height:      71 inches Weight:      195 pounds BMI:     27.30 Pulse rate:   74 / minute Resp:     16 per minute BP sitting:   108 / 70  (right arm)  Vitals Entered By: Juan Coy, CNA (January 24, 2011 9:53 AM)  Physical Exam  General:  Well developed, well nourished, in no acute distress. Head:  normocephalic and atraumatic Eyes:  PERRLA/EOM intact; conjunctiva and lids normal. Mouth:  Teeth, gums and palate normal. Oral mucosa normal. Neck:  Neck supple, no JVD. No masses, thyromegaly or abnormal cervical nodes. Chest Wall:  no deformities or breast masses noted Lungs:  Clear bilaterally to auscultation and percussion.  Heart:  Non-displaced PMI, chest non-tender; regular rate and rhythm, S1, S2 without murmurs, rubs or gallops. Carotid upstroke normal, no bruit. Normal abdominal aortic size, no bruits. Femorals normal pulses, no bruits. Pedals normal pulses. No edema, no varicosities. Abdomen:  Bowel sounds positive; abdomen soft and non-tender without masses, organomegaly, or hernias noted. No hepatosplenomegaly. Msk:  Back normal, normal gait. Muscle strength and tone normal. Extremities:  Right radial catheterization site well healed Neurologic:  Alert  and oriented x 3.   EKG  Procedure date:  01/24/2011  Findings:      Sinus rhythm, rate 72, axis within normal limits, intervals within normal limits, no acute ST-T wave changes.  Impression & Recommendations:  Problem # 1:  PERCUTANEOUS TRANSLUMINAL CORONARY ANGIOPLASTY, HX OF (ICD-V45.82) The patient is doing well and for now we will continue the meds as listed. No change in therapy is indicated. He understands not to discontinue Effient unless he consults with [place].  Problem # 2:  HYPERTENSION (ICD-401.9) His blood pressure is  controlled and he will continue the meds as listed.  Problem # 3:  DYSLIPIDEMIA (ICD-272.4) His HDL was 34 and LDL 44 in the hospital.  He will continue the meds as listed although we might switch for cost reasons (Niaspan) in the future.  Other Orders: EKG w/ Interpretation (93000)  Patient Instructions: 1)  Your physician recommends that you schedule a follow-up appointment in: 2 MONTHS WITH DR [facility] 2)  Your physician recommends that you continue on your current medications as directed. Please refer to the Current Medication list given to you today.

## 2011-02-06 ENCOUNTER — Encounter: Payer: BC Managed Care – PPO | Admitting: Cardiology

## 2011-02-08 NOTE — Cardiovascular Report (Signed)
Summary: [facility]   [facility]   Imported By: Erle Crocker 01/31/2011 16:24:57  _____________________________________________________________________  External Attachment:    Type:   Image     Comment:   External Document

## 2011-02-19 ENCOUNTER — Encounter (HOSPITAL_COMMUNITY): Payer: BC Managed Care – PPO | Attending: Cardiology

## 2011-02-19 ENCOUNTER — Encounter (HOSPITAL_COMMUNITY): Payer: BC Managed Care – PPO

## 2011-02-19 DIAGNOSIS — Z9861 Coronary angioplasty status: Secondary | ICD-10-CM | POA: Insufficient documentation

## 2011-02-19 DIAGNOSIS — I200 Unstable angina: Secondary | ICD-10-CM | POA: Insufficient documentation

## 2011-02-19 DIAGNOSIS — E785 Hyperlipidemia, unspecified: Secondary | ICD-10-CM | POA: Insufficient documentation

## 2011-02-19 DIAGNOSIS — I252 Old myocardial infarction: Secondary | ICD-10-CM | POA: Insufficient documentation

## 2011-02-19 DIAGNOSIS — I2510 Atherosclerotic heart disease of native coronary artery without angina pectoris: Secondary | ICD-10-CM | POA: Insufficient documentation

## 2011-02-19 DIAGNOSIS — Z7982 Long term (current) use of aspirin: Secondary | ICD-10-CM | POA: Insufficient documentation

## 2011-02-19 DIAGNOSIS — Z5189 Encounter for other specified aftercare: Secondary | ICD-10-CM | POA: Insufficient documentation

## 2011-02-19 DIAGNOSIS — I10 Essential (primary) hypertension: Secondary | ICD-10-CM | POA: Insufficient documentation

## 2011-02-21 ENCOUNTER — Encounter (HOSPITAL_COMMUNITY): Payer: BC Managed Care – PPO

## 2011-02-23 ENCOUNTER — Encounter (HOSPITAL_COMMUNITY): Payer: BC Managed Care – PPO

## 2011-02-26 ENCOUNTER — Encounter (HOSPITAL_COMMUNITY): Payer: BC Managed Care – PPO

## 2011-02-28 ENCOUNTER — Encounter (HOSPITAL_COMMUNITY): Payer: BC Managed Care – PPO

## 2011-03-02 ENCOUNTER — Encounter (HOSPITAL_COMMUNITY): Payer: BC Managed Care – PPO

## 2011-03-05 ENCOUNTER — Encounter (HOSPITAL_COMMUNITY): Payer: BC Managed Care – PPO | Attending: Cardiology

## 2011-03-05 ENCOUNTER — Encounter (HOSPITAL_COMMUNITY): Payer: BC Managed Care – PPO

## 2011-03-05 DIAGNOSIS — I200 Unstable angina: Secondary | ICD-10-CM | POA: Insufficient documentation

## 2011-03-05 DIAGNOSIS — Z7982 Long term (current) use of aspirin: Secondary | ICD-10-CM | POA: Insufficient documentation

## 2011-03-05 DIAGNOSIS — Z9861 Coronary angioplasty status: Secondary | ICD-10-CM | POA: Insufficient documentation

## 2011-03-05 DIAGNOSIS — E785 Hyperlipidemia, unspecified: Secondary | ICD-10-CM | POA: Insufficient documentation

## 2011-03-05 DIAGNOSIS — I10 Essential (primary) hypertension: Secondary | ICD-10-CM | POA: Insufficient documentation

## 2011-03-05 DIAGNOSIS — I252 Old myocardial infarction: Secondary | ICD-10-CM | POA: Insufficient documentation

## 2011-03-05 DIAGNOSIS — Z5189 Encounter for other specified aftercare: Secondary | ICD-10-CM | POA: Insufficient documentation

## 2011-03-05 DIAGNOSIS — I2510 Atherosclerotic heart disease of native coronary artery without angina pectoris: Secondary | ICD-10-CM | POA: Insufficient documentation

## 2011-03-07 ENCOUNTER — Encounter (HOSPITAL_COMMUNITY): Payer: BC Managed Care – PPO

## 2011-03-07 ENCOUNTER — Encounter: Payer: Self-pay | Admitting: Cardiology

## 2011-03-09 ENCOUNTER — Encounter (HOSPITAL_COMMUNITY): Payer: BC Managed Care – PPO

## 2011-03-12 ENCOUNTER — Encounter (HOSPITAL_COMMUNITY): Payer: BC Managed Care – PPO

## 2011-03-14 ENCOUNTER — Encounter (HOSPITAL_COMMUNITY): Payer: BC Managed Care – PPO

## 2011-03-16 ENCOUNTER — Encounter (HOSPITAL_COMMUNITY): Payer: BC Managed Care – PPO

## 2011-03-19 ENCOUNTER — Encounter (HOSPITAL_COMMUNITY): Payer: BC Managed Care – PPO

## 2011-03-21 ENCOUNTER — Encounter (HOSPITAL_COMMUNITY): Payer: BC Managed Care – PPO

## 2011-03-22 ENCOUNTER — Other Ambulatory Visit (INDEPENDENT_AMBULATORY_CARE_PROVIDER_SITE_OTHER): Payer: BC Managed Care – PPO | Admitting: *Deleted

## 2011-03-22 DIAGNOSIS — E785 Hyperlipidemia, unspecified: Secondary | ICD-10-CM

## 2011-03-22 DIAGNOSIS — Z79899 Other long term (current) drug therapy: Secondary | ICD-10-CM

## 2011-03-22 LAB — HEPATIC FUNCTION PANEL
Albumin: 3.8 g/dL (ref 3.5–5.2)
Alkaline Phosphatase: 70 U/L (ref 39–117)
Total Protein: 6.8 g/dL (ref 6.0–8.3)

## 2011-03-22 LAB — LIPID PANEL
Cholesterol: 119 mg/dL (ref 0–200)
LDL Cholesterol: 58 mg/dL (ref 0–99)
Triglycerides: 72 mg/dL (ref 0.0–149.0)

## 2011-03-23 ENCOUNTER — Encounter (HOSPITAL_COMMUNITY): Payer: BC Managed Care – PPO

## 2011-03-24 ENCOUNTER — Encounter: Payer: Self-pay | Admitting: Cardiology

## 2011-03-26 ENCOUNTER — Encounter (HOSPITAL_COMMUNITY): Payer: BC Managed Care – PPO

## 2011-03-27 ENCOUNTER — Ambulatory Visit (INDEPENDENT_AMBULATORY_CARE_PROVIDER_SITE_OTHER): Payer: BC Managed Care – PPO | Admitting: Cardiology

## 2011-03-27 ENCOUNTER — Encounter: Payer: Self-pay | Admitting: Cardiology

## 2011-03-27 VITALS — BP 122/64 | HR 89 | Ht 71.0 in | Wt 197.0 lb

## 2011-03-27 DIAGNOSIS — I2510 Atherosclerotic heart disease of native coronary artery without angina pectoris: Secondary | ICD-10-CM

## 2011-03-27 DIAGNOSIS — I10 Essential (primary) hypertension: Secondary | ICD-10-CM

## 2011-03-27 DIAGNOSIS — E785 Hyperlipidemia, unspecified: Secondary | ICD-10-CM

## 2011-03-27 MED ORDER — METOPROLOL SUCCINATE ER 25 MG PO TB24: 25.0000 mg | ORAL_TABLET | Freq: Every day | ORAL | Status: DC

## 2011-03-27 NOTE — Assessment & Plan Note (Signed)
The blood pressure is at target. No change in medications is indicated. We will continue with therapeutic lifestyle changes (TLC).  

## 2011-03-27 NOTE — Progress Notes (Signed)
HPI The patient presents for followup of his known coronary disease. Since I last saw him he has had no new cardiovascular complaints. He has participated in cardiac rehabilitation. He denies any chest pressure, neck or arm discomfort. He has had no palpitations, presyncope or syncope. He has had no weight gain or edema. He denies any shortness of breath, PND or orthopnea.  No Known Allergies  Current Outpatient Prescriptions  Medication Sig Dispense Refill  . aspirin 81 MG tablet Take 81 mg by mouth daily.        Marland Kitchen atorvastatin (LIPITOR) 20 MG tablet Take 10 mg by mouth daily.        . metoprolol tartrate (LOPRESSOR) 25 MG tablet Take 1/2 tablet twice a day       . niacin (NIASPAN) 1000 MG CR tablet Take 1 1/2 tablets daily       . prasugrel (EFFIENT) 10 MG TABS Take by mouth.        . ramipril (ALTACE) 2.5 MG capsule Take 2.5 mg by mouth daily.          Past Medical History  Diagnosis Date  . Diverticulosis of colon   . Myocardial infarction     RCA occlusion with 2 drug-eluting stents 2004, 90% stenosis distal to the stents in the same artery treated with drug-eluting stenting February 2012  . Melanoma   . Dyslipidemia   . Coronary artery disease     No past surgical history on file.  ROS:  As stated in the HPI and negative for all other systems.  PHYSICAL EXAM BP 122/64  Pulse 89  Ht 5\' 11"  (1.803 m)  Wt 197 lb (89.359 kg)  BMI 27.48 kg/m2 GENERAL:  Well appearing HEENT:  Pupils equal round and reactive, fundi not visualized, oral mucosa unremarkable NECK:  No jugular venous distention, waveform within normal limits, carotid upstroke brisk and symmetric, no bruits, no thyromegaly LYMPHATICS:  No cervical, inguinal adenopathy LUNGS:  Clear to auscultation bilaterally BACK:  No CVA tenderness CHEST:  Unremarkable HEART:  PMI not displaced or sustained,S1 and S2 within normal limits, no S3, no S4, no clicks, no rubs, no murmurs ABD:  Flat, positive bowel sounds normal in  frequency in pitch, no bruits, no rebound, no guarding, no midline pulsatile mass, no hepatomegaly, no splenomegaly EXT:  2 plus pulses throughout, no edema, no cyanosis no clubbing SKIN:  No rashes no nodules NEURO:  Cranial nerves II through XII grossly intact, motor grossly intact throughout PSYCH:  Cognitively intact, oriented to person place and time   ASSESSMENT AND PLAN

## 2011-03-27 NOTE — Assessment & Plan Note (Signed)
I reviewed his lipids. He has an excellent profile and he will continue meds as listed.

## 2011-03-27 NOTE — Patient Instructions (Signed)
Change Metoprolol tartrate to Metoprolol succinate 25 mg daily Follow up in 6 months with Dr Lyn Hollingshead

## 2011-03-27 NOTE — Assessment & Plan Note (Signed)
He has no new symptoms. He will participate in risk reduction. I will see him in 6 months. After that provided he does well he will be 12 months until his next evaluation at which point I would probably screen him with a treadmill test.

## 2011-03-28 ENCOUNTER — Encounter: Payer: Self-pay | Admitting: *Deleted

## 2011-03-28 ENCOUNTER — Encounter (HOSPITAL_COMMUNITY): Payer: BC Managed Care – PPO

## 2011-03-28 NOTE — Progress Notes (Signed)
Letter mailed of results and to continue medications as listed

## 2011-03-30 ENCOUNTER — Encounter (HOSPITAL_COMMUNITY): Payer: BC Managed Care – PPO

## 2011-04-02 ENCOUNTER — Encounter (HOSPITAL_COMMUNITY): Payer: BC Managed Care – PPO

## 2011-04-04 ENCOUNTER — Encounter (HOSPITAL_COMMUNITY): Payer: BC Managed Care – PPO | Attending: Cardiology

## 2011-04-04 ENCOUNTER — Encounter (HOSPITAL_COMMUNITY): Payer: BC Managed Care – PPO

## 2011-04-04 DIAGNOSIS — Z5189 Encounter for other specified aftercare: Secondary | ICD-10-CM | POA: Insufficient documentation

## 2011-04-04 DIAGNOSIS — I200 Unstable angina: Secondary | ICD-10-CM | POA: Insufficient documentation

## 2011-04-04 DIAGNOSIS — I10 Essential (primary) hypertension: Secondary | ICD-10-CM | POA: Insufficient documentation

## 2011-04-04 DIAGNOSIS — I252 Old myocardial infarction: Secondary | ICD-10-CM | POA: Insufficient documentation

## 2011-04-04 DIAGNOSIS — E785 Hyperlipidemia, unspecified: Secondary | ICD-10-CM | POA: Insufficient documentation

## 2011-04-04 DIAGNOSIS — Z9861 Coronary angioplasty status: Secondary | ICD-10-CM | POA: Insufficient documentation

## 2011-04-04 DIAGNOSIS — Z7982 Long term (current) use of aspirin: Secondary | ICD-10-CM | POA: Insufficient documentation

## 2011-04-04 DIAGNOSIS — I2510 Atherosclerotic heart disease of native coronary artery without angina pectoris: Secondary | ICD-10-CM | POA: Insufficient documentation

## 2011-04-06 ENCOUNTER — Encounter (HOSPITAL_COMMUNITY): Payer: BC Managed Care – PPO

## 2011-04-09 ENCOUNTER — Encounter (HOSPITAL_COMMUNITY): Payer: BC Managed Care – PPO

## 2011-04-11 ENCOUNTER — Encounter (HOSPITAL_COMMUNITY): Payer: BC Managed Care – PPO

## 2011-04-13 ENCOUNTER — Encounter (HOSPITAL_COMMUNITY): Payer: BC Managed Care – PPO

## 2011-04-16 ENCOUNTER — Encounter (HOSPITAL_COMMUNITY): Payer: BC Managed Care – PPO

## 2011-04-17 NOTE — Assessment & Plan Note (Signed)
[facility]                            CARDIOLOGY OFFICE NOTE   NAME:Juan [name], Juan [name]                        MRN:          [id number]  DATE:08/12/2007                            DOB:          1951/10/07    PRIMARY CARE PHYSICIAN:  Lonzo Cloud. Kriste Basque, MD   REASON FOR PRESENTATION:  Evaluate patient with coronary artery disease.   HISTORY OF PRESENT ILLNESS:  The patient is a 60 year old gentleman who  returns for yearly followup.  He has had some complaints of soreness in  his muscles when he exercises.  He says this was mild, but it has  somewhat limited him in what he wants to do.  He has not been exercising  for about 3 weeks.  He was prior to that jogging and walking about 3  miles a day.  With this, he was not getting any chest pressure, neck or  arm discomfort.  He is not getting any palpitations, presyncope, or  syncope.  He has not been having any PND or orthopnea.  He has been  denying any shortness of breath.   PAST MEDICAL HISTORY:  1. Coronary artery disease (occluded right coronary artery at the      [employer], treated with stenting of the right      coronary lesion with a downstream 75% stenosis as well.  He had 2      drug-eluting stents placed.  He had a 25% circumflex stenosis.)  2. Melanoma, resected.  3. Dyslipidemia.   ALLERGIES:  None.   MEDICATIONS:  1. Aspirin 162 mg daily.  2. Lipitor 10 mg daily.  3. Niaspan 100 mg daily.  4. Altace 2.5 mg daily.   REVIEW OF SYSTEMS:  As stated in the HPI, otherwise negative for other  systems.   PHYSICAL EXAMINATION:  GENERAL:  The patient is in no distress.  VITAL SIGNS:  Blood pressure 104/62, heart rate 85 and regular.  Weight  184 pounds.  HEENT:  Eyelids unremarkable. Pupils equal, round, and reactive to  light.  Fundi not visualized.  Oral mucosa unremarkable.  NECK:  No jugular venous distention a 45 degrees.  Carotid upstrokes  brisk and symmetric.  No bruits, no  thyromegaly.  LYMPHATICS:  No cervical, axillary, or inguinal adenopathy.  LUNGS:  Clear to auscultation bilaterally.  BACK:  No costovertebral angle tenderness.  CHEST:  Unremarkable.  CARDIAC:  PMI not displaced or sustained.  S1 and S2 within normal  limits.  No S3, S4, click, rubs, murmurs.  ABDOMEN:  Flat. Positive bowel sounds, normal in frequency and pitch. No  bruits, rebound, guarding.  No midline pulsatile mass.  No organomegaly.  SKIN:  No rashes or nodules.  EXTREMITIES:  2+ pulses, no edema.   EKG:  Sinus rhythm, rate 82, axis within normal limits, intervals within  normal limits.  No acute ST-T wave changes.   ASSESSMENT AND PLAN:  1. Coronary disease:  The patient is having no further symptoms      consistent with coronary disease.  No further cardiovascular      testing  is suggested.  Will continue aggressive risk reduction.  2. Muscle aches:  The patient has some muscle aches with exercise.  At      this point, I am going to have him stop Lipitor for about 4 weeks.      He will let me know if these symptoms go away.  Most likely I would      rechallenge him with the Lipitor versus picking another statin.      If his symptoms do not get better in that time frame, he will go      back on the Lipitor, and we will consider other causes of the      muscle aches.  3. Dyslipidemia:  As above.  He had an excellent lipid profile.  I      would like to continue combination therapy with a statin  and      Niaspan.  4. Hypertension:  Blood pressure is well controlled.  He will      continued medications as listed.  5. Followup:  Will see him back in 6 months or sooner if needed.     Rollene Rotunda, MD, [facility]  Electronically Signed    JH/MedQ  DD: 08/12/2007  DT: 08/13/2007  Job #: [id number]   cc:   Lonzo Cloud. Kriste Basque, MD

## 2011-04-17 NOTE — Assessment & Plan Note (Signed)
[facility]                            CARDIOLOGY OFFICE NOTE   NAME:Stonerock, ELIN SEATS                        MRN:          [id number]  DATE:05/04/2008                            DOB:          [birth year]    PRIMARY CARE PHYSICIAN:  Lonzo Cloud. Kriste Basque, MD.   REASON FOR PRESENTATION:  Evaluate patient with coronary disease.   HISTORY OF PRESENT ILLNESS:  The patient presents for 45-month followup.  I wanted to see him back a little sooner than usual because at the last  visit he was complaining of muscle aches.  We tried him off Lipitor.  His muscle aches actually went away, but when he restarted the Lipitor,  they did not come back.  Therefore, he has been back on that drug.  His  last lipid profile done recently demonstrated his cholesterol to be 114  total, triglycerides 50, HDL 37.1 and LDL 67.  He has been active.  He  exercises routinely.  He denies any chest pressure, neck or arm  discomfort.  He has had no palpitations, presyncope, or syncope. He has  had no PND or orthopnea.   PAST MEDICAL HISTORY:  1. Coronary artery disease (occluded right coronary artery at      [facility] in 2004 treated with stenting of the right      coronary artery with a downstream 75% stenosis as well.  He had two      drug-eluting stents placed.  Circumflex had 25% stenosis).  2. Melanoma, resected.  3. Dyslipidemia.   ALLERGIES:  None.   MEDICATIONS:  1. Lipitor 10 mg daily.  2. Niaspan 1000 mg daily.  3. Aspirin 325 mg daily.  4. Ramipril 2.5 mg daily.   REVIEW OF SYSTEMS:  As stated in HPI, otherwise negative for other  systems.   PHYSICAL EXAMINATION:  GENERAL:  The patient is in no distress.  VITAL SIGNS:  Blood pressure 122/88, heart rate 82 and regular.  Weight  186 pounds.  HEENT:  Eyelids unremarkable.  Pupils equal, round, and reactive to  light.  Fundi not visualized.  NECK:  No jugular venous distention at 45 degrees. Carotid upstroke  brisk and symmetrical.  No bruits, no thyromegaly.  LYMPHATICS:  No adenopathy.  LUNGS:  Clear to auscultation bilaterally.  BACK:  No costovertebral angle tenderness.  CHEST:  Unremarkable.  HEART:  PMI not displaced or sustained.  S1-S2 within normal limits.  No  S3, no S4, no clicks, rubs, murmurs.  ABDOMEN:  Flat, positive bowel sounds normal in frequency and pitch. No  bruits, rebound, guarding or midline pulsatile mass.  No organomegaly.  SKIN:  No rash, no nodules.  EXTREMITIES:  2+ pulses, no edema.   EKG:  Sinus rhythm, rate 84, axis within normal limits, intervals within  normal limits, no acute ST wave changes.   ASSESSMENT AND PLAN:  1. Coronary disease.  The patient is having no ongoing symptoms.  No      further cardiovascular testing is suggested.  He will continue with      secondary risk reduction.  2. Dyslipidemia.  I will go ahead and increase his Niaspan to 1500 mg      daily, trying to raise his HDL slightly.  He will come back and get      a fasting lipid profile and liver enzymes in 10 weeks.  3. Followup.  We will see the patient again in 1 year or sooner if      needed.     Rollene Rotunda, MD, [facility]  Electronically Signed    JH/MedQ  DD: 05/04/2008  DT: 05/04/2008  Job #: [id number]   cc:   Lonzo Cloud. Kriste Basque, MD

## 2011-04-18 ENCOUNTER — Telehealth: Payer: Self-pay | Admitting: Pulmonary Disease

## 2011-04-18 ENCOUNTER — Encounter (HOSPITAL_COMMUNITY): Payer: BC Managed Care – PPO

## 2011-04-20 ENCOUNTER — Encounter (HOSPITAL_COMMUNITY): Payer: BC Managed Care – PPO

## 2011-04-20 NOTE — Assessment & Plan Note (Signed)
[facility]                              CARDIOLOGY OFFICE NOTE   NAME:[name], [name]                        MRN:          [id number]  DATE:08/28/2006                            DOB:          1950/12/25    PRIMARY:  Dr. Alroy Dust   REASON FOR PRESENTATION:  Evaluate patient with coronary disease.   HISTORY OF PRESENT ILLNESS:  The patient returns for yearly followup.  He  has done quite well.  He has been active, though he is not exercising as  much as I would like.  With his levels of activity, including yard work, he  denies any chest discomfort, neck discomfort, arm discomfort, activity-  induced nausea or vomiting, or excessive diaphoresis.  He has got no  palpitation, presyncope or syncope.  He denies any PND or orthopnea.  He has  had an excellent lipid profile (HDL 42.9, LDL 55, triglycerides 60, total  110).   PAST MEDICAL HISTORY:  Coronary artery disease (Occluded right coronary  artery at the [facility] treated with stenting of the right  coronary lesion with a down-stream 75% stenosis.  He had two drug-eluting  stents placed.  He had 25% circumflex noticed.), melanoma resected,  dyslipidemia.   ALLERGIES:  None.   MEDICATIONS:  1. Aspirin 162 mg a day.  2. Lipitor 10 mg a day.  3. Niaspan 1000 mg a day.  4. Altace 2.5 mg daily.   REVIEW OF SYSTEMS:  As stated in the HPI and otherwise negative for other  systems.   PHYSICAL EXAMINATION:  GENERAL:  The patient is in no distress.  VITAL SIGNS:  Blood pressure 110/72, heart rate 66 and regular, weight 184  pounds.  NECK:  No jugular venous distention, waveform within normal limits, carotid  upstroke brisk and symmetric.  No bruits, no thyromegaly.  LYMPHATICS:  No adenopathy.  LUNGS:  Clear to auscultation bilaterally.  BACK:  No costovertebral angle tenderness.  CHEST:  Unremarkable.  HEART:  PMI not displaced or sustained.  S1 and S2 within normal limits.   No  S3, no S4, no murmurs.  ABDOMEN:  Flat, positive bowel sounds normal in frequency and pitch.  No  bruits, rebound, guarding.  No midline pulsatile mass, no organomegaly.  SKIN:  No rashes, no nodules.  EXTREMITIES:  Show 2+ pulses, no edema.   ASSESSMENT AND PLAN:  1. Coronary disease.  The patient is having no symptoms.  He is      participating in risk reduction to some degree, though I want him to      exercise more.  No further cardiovascular testing is suggested.  2. Dyslipidemia.  This is an excellent lipid profile with normal liver      enzymes.  He will continue on this regimen.  He knows I would like him      to get his lipids and liver checked twice a year on this combination.  3. Followup.  I will see him back in 12-18 months.            ______________________________  Rollene Rotunda, MD, [facility]     JH/MedQ  DD:  08/28/2006  DT:  08/30/2006  Job #:  [id number]   cc:   Lonzo Cloud. Kriste Basque, MD

## 2011-04-23 ENCOUNTER — Encounter (HOSPITAL_COMMUNITY): Payer: BC Managed Care – PPO

## 2011-04-25 ENCOUNTER — Encounter (HOSPITAL_COMMUNITY): Payer: BC Managed Care – PPO

## 2011-04-27 ENCOUNTER — Encounter (HOSPITAL_COMMUNITY): Payer: BC Managed Care – PPO

## 2011-04-30 ENCOUNTER — Encounter (HOSPITAL_COMMUNITY): Payer: BC Managed Care – PPO

## 2011-05-01 NOTE — Telephone Encounter (Signed)
Pt calling back

## 2011-05-02 ENCOUNTER — Encounter (HOSPITAL_COMMUNITY): Payer: BC Managed Care – PPO

## 2011-05-04 ENCOUNTER — Encounter (HOSPITAL_COMMUNITY): Payer: BC Managed Care – PPO | Attending: Cardiology

## 2011-05-04 ENCOUNTER — Encounter (HOSPITAL_COMMUNITY): Payer: BC Managed Care – PPO

## 2011-05-04 DIAGNOSIS — I2510 Atherosclerotic heart disease of native coronary artery without angina pectoris: Secondary | ICD-10-CM | POA: Insufficient documentation

## 2011-05-04 DIAGNOSIS — Z9861 Coronary angioplasty status: Secondary | ICD-10-CM | POA: Insufficient documentation

## 2011-05-04 DIAGNOSIS — Z5189 Encounter for other specified aftercare: Secondary | ICD-10-CM | POA: Insufficient documentation

## 2011-05-04 DIAGNOSIS — Z7982 Long term (current) use of aspirin: Secondary | ICD-10-CM | POA: Insufficient documentation

## 2011-05-04 DIAGNOSIS — I200 Unstable angina: Secondary | ICD-10-CM | POA: Insufficient documentation

## 2011-05-04 DIAGNOSIS — I10 Essential (primary) hypertension: Secondary | ICD-10-CM | POA: Insufficient documentation

## 2011-05-04 DIAGNOSIS — I252 Old myocardial infarction: Secondary | ICD-10-CM | POA: Insufficient documentation

## 2011-05-04 DIAGNOSIS — E785 Hyperlipidemia, unspecified: Secondary | ICD-10-CM | POA: Insufficient documentation

## 2011-05-04 NOTE — Telephone Encounter (Signed)
lmomtcb x 1. Just needs to schedule for July CPX first available.

## 2011-05-04 NOTE — Telephone Encounter (Signed)
Ok to schedule appt for pt for next aval in July.  thanks

## 2011-05-07 ENCOUNTER — Encounter (HOSPITAL_COMMUNITY): Payer: BC Managed Care – PPO

## 2011-05-07 NOTE — Telephone Encounter (Signed)
Called spoke with patient.  cpx appt scheduled w/ SN 7.3.12 @ 1400.  Pt to call the week before for fasting labs.  Will sign off on msg.

## 2011-05-09 ENCOUNTER — Encounter (HOSPITAL_COMMUNITY): Payer: BC Managed Care – PPO

## 2011-05-11 ENCOUNTER — Encounter (HOSPITAL_COMMUNITY): Payer: BC Managed Care – PPO

## 2011-05-14 ENCOUNTER — Encounter (HOSPITAL_COMMUNITY): Payer: BC Managed Care – PPO

## 2011-05-16 ENCOUNTER — Encounter (HOSPITAL_COMMUNITY): Payer: BC Managed Care – PPO

## 2011-05-18 ENCOUNTER — Encounter (HOSPITAL_COMMUNITY): Payer: BC Managed Care – PPO

## 2011-05-21 ENCOUNTER — Encounter (HOSPITAL_COMMUNITY): Payer: BC Managed Care – PPO

## 2011-05-23 ENCOUNTER — Encounter (HOSPITAL_COMMUNITY): Payer: BC Managed Care – PPO

## 2011-05-25 ENCOUNTER — Encounter (HOSPITAL_COMMUNITY): Payer: BC Managed Care – PPO

## 2011-05-25 ENCOUNTER — Telehealth: Payer: Self-pay | Admitting: Cardiology

## 2011-05-25 NOTE — Telephone Encounter (Signed)
Pt at cardiac rehab now. Pt states that toprol suppose to be changed to new dosage. When he went to pick up at pharmacy pt still on the same medication. Pt would like new medication called in.

## 2011-05-25 NOTE — Telephone Encounter (Signed)
Per Byrd Hesselbach - pt is supposed to be taking Toprol XL 25 mg once a day.  Pt states it is still being filled for the BID dosing.  RX was sent into pharmacy 03/27/2011 for Toprol XL 25 mg daily.  Asked Byrd Hesselbach to double check with pt if he is asking for refills on old RX.  She will call the pt.

## 2011-05-28 ENCOUNTER — Telehealth: Payer: Self-pay | Admitting: Pulmonary Disease

## 2011-05-28 DIAGNOSIS — E785 Hyperlipidemia, unspecified: Secondary | ICD-10-CM

## 2011-05-28 DIAGNOSIS — Z8719 Personal history of other diseases of the digestive system: Secondary | ICD-10-CM

## 2011-05-28 DIAGNOSIS — Z0000 Encounter for general adult medical examination without abnormal findings: Secondary | ICD-10-CM

## 2011-05-28 DIAGNOSIS — I10 Essential (primary) hypertension: Secondary | ICD-10-CM

## 2011-05-28 NOTE — Telephone Encounter (Signed)
Please ask sn to mark labs for 7/3 appt

## 2011-05-28 NOTE — Telephone Encounter (Signed)
Pt last seen 2008, was okay'd to reestablish.  Per TP: okay for lipid, bmet, psa,cbcd, tsh, ua, hepatic.    Orders placed in epic and patient is aware.

## 2011-05-31 ENCOUNTER — Other Ambulatory Visit (INDEPENDENT_AMBULATORY_CARE_PROVIDER_SITE_OTHER): Payer: BC Managed Care – PPO

## 2011-05-31 DIAGNOSIS — E785 Hyperlipidemia, unspecified: Secondary | ICD-10-CM

## 2011-05-31 DIAGNOSIS — Z0000 Encounter for general adult medical examination without abnormal findings: Secondary | ICD-10-CM

## 2011-05-31 DIAGNOSIS — Z8719 Personal history of other diseases of the digestive system: Secondary | ICD-10-CM

## 2011-05-31 DIAGNOSIS — I10 Essential (primary) hypertension: Secondary | ICD-10-CM

## 2011-05-31 LAB — CBC WITH DIFFERENTIAL/PLATELET
Basophils Relative: 0.5 % (ref 0.0–3.0)
Eosinophils Absolute: 0.2 10*3/uL (ref 0.0–0.7)
HCT: 42.8 % (ref 39.0–52.0)
Hemoglobin: 14 g/dL (ref 13.0–17.0)
Lymphs Abs: 2.2 10*3/uL (ref 0.7–4.0)
MCHC: 32.8 g/dL (ref 30.0–36.0)
MCV: 85.3 fl (ref 78.0–100.0)
Monocytes Absolute: 0.7 10*3/uL (ref 0.1–1.0)
Neutro Abs: 4 10*3/uL (ref 1.4–7.7)
RBC: 5.02 Mil/uL (ref 4.22–5.81)

## 2011-05-31 LAB — URINALYSIS
Ketones, ur: NEGATIVE
Leukocytes, UA: NEGATIVE
Nitrite: NEGATIVE
Specific Gravity, Urine: 1.025 (ref 1.000–1.030)
Urobilinogen, UA: 0.2 (ref 0.0–1.0)
pH: 5.5 (ref 5.0–8.0)

## 2011-05-31 LAB — BASIC METABOLIC PANEL
CO2: 30 mEq/L (ref 19–32)
Calcium: 9 mg/dL (ref 8.4–10.5)
Glucose, Bld: 96 mg/dL (ref 70–99)
Potassium: 4.2 mEq/L (ref 3.5–5.1)
Sodium: 138 mEq/L (ref 135–145)

## 2011-05-31 LAB — LIPID PANEL: HDL: 51.6 mg/dL (ref >39.00)

## 2011-05-31 LAB — TSH: TSH: 3.1 u[IU]/mL (ref 0.35–5.50)

## 2011-06-05 ENCOUNTER — Encounter: Payer: Self-pay | Admitting: Gastroenterology

## 2011-06-05 ENCOUNTER — Encounter: Payer: Self-pay | Admitting: Pulmonary Disease

## 2011-06-05 ENCOUNTER — Ambulatory Visit (INDEPENDENT_AMBULATORY_CARE_PROVIDER_SITE_OTHER): Payer: BC Managed Care – PPO | Admitting: Pulmonary Disease

## 2011-06-05 DIAGNOSIS — Z23 Encounter for immunization: Secondary | ICD-10-CM

## 2011-06-05 DIAGNOSIS — Z0000 Encounter for general adult medical examination without abnormal findings: Secondary | ICD-10-CM | POA: Insufficient documentation

## 2011-06-05 DIAGNOSIS — M1990 Unspecified osteoarthritis, unspecified site: Secondary | ICD-10-CM

## 2011-06-05 DIAGNOSIS — M159 Polyosteoarthritis, unspecified: Secondary | ICD-10-CM | POA: Insufficient documentation

## 2011-06-05 DIAGNOSIS — N400 Enlarged prostate without lower urinary tract symptoms: Secondary | ICD-10-CM

## 2011-06-05 DIAGNOSIS — M79641 Pain in right hand: Secondary | ICD-10-CM

## 2011-06-05 DIAGNOSIS — I252 Old myocardial infarction: Secondary | ICD-10-CM

## 2011-06-05 DIAGNOSIS — I10 Essential (primary) hypertension: Secondary | ICD-10-CM

## 2011-06-05 DIAGNOSIS — E785 Hyperlipidemia, unspecified: Secondary | ICD-10-CM

## 2011-06-05 DIAGNOSIS — K5730 Diverticulosis of large intestine without perforation or abscess without bleeding: Secondary | ICD-10-CM | POA: Insufficient documentation

## 2011-06-05 DIAGNOSIS — I2510 Atherosclerotic heart disease of native coronary artery without angina pectoris: Secondary | ICD-10-CM

## 2011-06-05 MED ORDER — TETANUS-DIPHTH-ACELL PERTUSSIS 5-2.5-18.5 LF-MCG/0.5 IM SUSP
0.5000 mL | Freq: Once | INTRAMUSCULAR | Status: AC
  Administered 2011-06-05: 0.5 mL via INTRAMUSCULAR

## 2011-06-05 NOTE — Patient Instructions (Signed)
Today we updated your med list in EPIC>    Continue your current meds the same...  We will arrange for a follow up colonoscopy w/ DrKaplan (they should call you soone to sched at your convenience).  We will also set up an Orthopedic eval w/ a Hand specialist regarding your right hand symptoms...  Call for any questions...  Let's plan a routine follow up eval in 1 year, sooner if needed for problems.Marland KitchenMarland Kitchen

## 2011-06-05 NOTE — Progress Notes (Signed)
Subjective:    Patient ID: Juan [name], male    DOB: [birth year], 60 y.o.   MRN: [id number]  HPI 60 y/o WM (goes by Juan [name]") here to re-establish after a 71yr hiatus>  He has HBP, CAD, s/pMI, & Hypercholesterolemia followed regularly by DrHohrein; and Hx Melanomas x3 in the past w/ regular f/u by DrHTurner [facility]...  ~  June 05, 2011:  Approx 46yr ROV & re-establish> SEE UPDATED PROB LIST BELOW>>  His chief complaint is a one month hx of right hand weakness & discomfort in the hand and wrist that started when he pulled himself up onto a truck (?etiology- ?CTS, ?tendonitis, ?other) & we discussed need to refer to Hand specialist...   Problem List:  HBP> Controlled on TOPROL XL 25mg /d, and ALTACE 2.5mg /d...  ~  7/12: BP=120/70, tolerating meds well> denies HA, visual symptoms, CP,palpit, dizziness, syncope, dyspnea, edema, etc...  CAD, s/p MI & PTCA/ stents> On above meds + EFFIENT 10mg /d, and ASA 81mg /d... ~  2004:  Hx IWMI w/ cath & RCA occlusion; s/p PTCA & 2 stents placed; this occurred [place]... ~  9/11:  Standard Treadmill Stress Test by DrHochrein was neg> no EKG changes noted, good exercise tolerance... ~  2/12:  Hosp w/ unstable angina; cath showed 90% midRCA stenosis; s/p PTCA/ DES & placed on EFFIENT; also had 50% OM-2 lesion, otherw neg w/ EF=60-65% ~  4/12:  F/u by DrHochrein> stable, no new complaints, no changes made...  HYPERCHOLESTEROLEMIA> Controlled on LIPITOR 20mg - taking 1/2 tab daily x yrs, and NIASPAN 1000mg - taking 1.5 tabs Qhs... ~  FLP 2/12 on Lip10+Niasp1500 showed TChol 115, TG 184, HDL 34, LDL 44 ~  FLP 6/12 on Lip10+Niasp1500 showed TChol 124, TG 44, HDL 52, LDL 64  DIVERTICULOSIS> I reviewed old paper chart & last colonoscopy was 1/01 by Dorris Singh revealing only diverticulosis & rec for f/u colon in 10 years... ~  7/12:  We will refer back to GI to set up f/u colonoscopy at his convenience...  BPH & Hx HEMATURIA> He was evaluated by DrPeterson 9/11  after an episode of hematuria> note reviewed, neg eval... 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 Hx of Left Shoulder Pain> He notes prev hx left shoulder discomfort w/ shot given by Alinda Sierras New onset Right Wrist/Hand Pain>  C/o some right hand weakness & discomfort x 56mo ever since he reached & pulled himself up on a truck; we discussed referral to [facility]Gramig at [facility] for further eval...  Hx MELANOMAS> He reports hx 3 separate Melanomas: 1st on left upper chest wall at age 12;  2nd on mid back in early 2000s;  3rd on medial left thigh above the knee w/ wide excision 12/11 & he reports that "they got it all... We don't have records (nothing in [employer], or [employer])... He continues to follow up w/ [facility] DrHTurner Antony Haste...  HEALTH MAINTENANCE: ~  GI:  Followed by DrKaplan> last colon 2001 & f/u due now, we will set up referral... ~  GU:  Followed by DrPeterson; neg hematuria eval 2011 & PSAs are normal... ~  Immunizations:  He refuses Flu vaccines ever since a shots yrs ago gave him the Flu;  Pneumovax will be due at 63;  TDAP given 7/12;  Discussed Shingles vaccine & he will check his insurance coverage...   Past Medical History  Diagnosis Date  . Diverticulosis of colon   .  Myocardial infarction     RCA occlusion with 2 drug-eluting stents 2004, 90% stenosis distal to the stents in the same artery treated with drug-eluting stenting February 2012  . Melanoma   . Dyslipidemia   . Coronary artery disease   . Hypertension     Past Surgical History  Procedure Date  . Ptca     Outpatient Encounter Prescriptions as of 06/05/2011  Medication Sig Dispense Refill  . aspirin 81 MG tablet Take 81 mg by mouth daily.        Marland Kitchen atorvastatin (LIPITOR) 20 MG tablet Take 10 mg by mouth daily.        . metoprolol succinate (TOPROL XL) 25 MG 24 hr tablet Take 1 tablet (25 mg total) by mouth daily.  30 tablet  11    . niacin (NIASPAN) 1000 MG CR tablet Take 1 1/2 tablets daily       . prasugrel (EFFIENT) 10 MG TABS Take by mouth.        . ramipril (ALTACE) 2.5 MG capsule Take 2.5 mg by mouth daily.         Facility-Administered Encounter Medications as of 06/05/2011  Medication Dose Route Frequency Provider Last Rate Last Dose  . TDaP (BOOSTRIX) injection 0.5 mL  0.5 mL Intramuscular Once Michele Mcalpine, MD   0.5 mL at 06/05/11 1503    No Known Allergies   Current Medications, Allergies, Past Medical History, Past Surgical History, Family History, and Social History were reviewed in [employer] electronic medical record.   Review of Systems    Constitutional:  Denies F/C/S, anorexia, unexpected weight change. HEENT:  No HA, visual changes, earache, nasal symptoms, sore throat, hoarseness. Resp:  No cough, sputum, hemoptysis; no SOB, tightness, wheezing. Cardio:  No CP, palpit, DOE, orthopnea, edema. GI:  Denies N/V/D/C or blood in stool; no reflux, abd pain, distention, or gas. GU:  No dysuria, freq, urgency, hematuria, or flank pain. MS:  He notes some discomfort involving his right wrist/ hand; no neck pain, back pain, etc. Neuro:  No tremors, seizures, dizziness, syncope, weakness, numbness, gait abn. Skin:  No suspicious lesions or skin rash. Heme:  No adenopathy, bruising, bleeding. Psyche: Denies confusion, sleep disturbance, hallucinations, anxiety, depression.   Objective:   Physical Exam    WD, WN, 60 y/o WM in NAD... Vital Signs:  Reviewed...  General:  Alert & oriented; pleasant & cooperative... HEENT:  [place]/AT, EOM-wnl, PERRLA, Fundi-benign, EACs-clear, TMs-wnl, NOSE-clear, THROAT-clear & wnl. Neck:  Supple w/ full ROM; no JVD; normal carotid impulses w/o bruits; no thyromegaly or nodules palpated; no lymphadenopathy. Chest:  Clear to P & A; without wheezes/ rales/ or rhonchi heard... Heart:  Regular Rhythm; norm S1 & S2 without murmurs/ rubs/ or gallops detected... Abdomen:   Soft & nontender; normal bowel sounds; no organomegaly or masses palpated... Ext:  Normal ROM; without deformities or arthritic changes; no varicose veins, venous insuffic, or edema;  Pulses intact w/o bruits... Neuro:  CNs II-XII intact; motor testing normal; sensory testing normal; gait normal & balance OK... Derm:  No lesions noted; no rash etc... Lymph:  No cervical, supraclavicular, axillary, or inguinal adenopathy palpated...    Assessment & Plan:   HBP>  Controlled on meds; continue same...  CAD>  Followed by DrHochrein & doing well on current regimen; he has participated in cardiac rehab...  CHOL>  Well controlled on his Lip10 & Niaspan (+diet & exercise)...  Divertics>  He is due for his screening colon & we will  refer...  DJD>  He is c/o some right hand & wrist discomfort; the etiology is not apparent on exam; we will [facility]...  Hx Melanomas>  He is followed regularly by Derm as he has had 3 separate melanomas.Marland KitchenMarland Kitchen

## 2011-07-04 ENCOUNTER — Other Ambulatory Visit: Payer: BC Managed Care – PPO | Admitting: Gastroenterology

## 2011-07-09 ENCOUNTER — Telehealth: Payer: Self-pay | Admitting: Cardiology

## 2011-07-09 ENCOUNTER — Telehealth: Payer: Self-pay | Admitting: *Deleted

## 2011-07-09 ENCOUNTER — Ambulatory Visit (AMBULATORY_SURGERY_CENTER): Payer: BC Managed Care – PPO | Admitting: *Deleted

## 2011-07-09 VITALS — Ht 71.0 in | Wt 188.0 lb

## 2011-07-09 DIAGNOSIS — Z1211 Encounter for screening for malignant neoplasm of colon: Secondary | ICD-10-CM

## 2011-07-09 MED ORDER — PEG-KCL-NACL-NASULF-NA ASC-C 100 G PO SOLR: ORAL | Status: DC

## 2011-07-09 NOTE — Telephone Encounter (Signed)
Pt will having colonoscopy done on Aug 16th and needs to come off his blood thinner please contact Selinda Michaels with instructions

## 2011-07-09 NOTE — Telephone Encounter (Signed)
Letter sent to Dr Antoine Poche for pt to come off Effient  Called and left message for [employer] nurse

## 2011-07-09 NOTE — Progress Notes (Signed)
Juan [name] had a stent put in his heart in February 2012 and is on Effient.  I told him we had to clear it with his cardiologist for him to come off it for his colon.  He said the cardiologist wants him on it for a year.  I went ahead and gave him his prep instructions in case he can come off the effient.  Wyona Almas

## 2011-07-12 ENCOUNTER — Telehealth: Payer: Self-pay | Admitting: *Deleted

## 2011-07-12 ENCOUNTER — Telehealth: Payer: Self-pay

## 2011-07-12 NOTE — Telephone Encounter (Signed)
Debbie RN called back from [facility]. Per Dr. Excell Seltzer the pt must stay on the effient. Pt just had drug eluding stent placed in Feb. If questions Dr. Excell Seltzer stated he would be happy to discuss this with Dr. Arlyce Dice.

## 2011-07-12 NOTE — Telephone Encounter (Signed)
Spoke with Juan [name] at [facility] cardiology to see if pt may hold effient. Dr. Kirtland Bouchard is out of town so she will check with the doc of the day and call us back.

## 2011-07-12 NOTE — Telephone Encounter (Signed)
DOD Dr. Excell Seltzer reviewed and Bonita Quin is aware pt needs to be on Effient x1 year.  Dr. Excell Seltzer is available to talk with Dr. Arlyce Dice if need be. Mylo Red RN

## 2011-07-12 NOTE — Telephone Encounter (Signed)
Message copied by Michele Mcalpine on Thu Jul 12, 2011  8:37 AM ------      Message from: Marlowe Kays      Created: Mon Jul 09, 2011  3:45 PM      Regarding: Effient blood thinner       Bonita Quin, I may be out of the office when they call back. Just in case I want you to be aware, that this pts procedure is next Thursday, Previsit sent me a message that we need to find out if he can hold medication. Just In case I am not here the man needs to know by Friday.Cherrie Gauze sent Dr Antoine Poche a letter and called there office

## 2011-07-12 NOTE — Telephone Encounter (Signed)
Linda from Dr. Marzetta Board office calls today b/c pt is scheduled for a  Colonoscopy on 07/19/11.  Pt had [employer] drug-eluding stent placed in February 2012.  I will forward to DOD for review and return call. Mylo Red RN

## 2011-07-14 NOTE — Telephone Encounter (Signed)
Ok to do on [employer]

## 2011-07-16 NOTE — Telephone Encounter (Signed)
Spoke with pt. He stated he does not want to continue with his procedure at this time. He does not feel comfortable staying on Effient and continuing with procedure. Will call back and reschedule at a later date

## 2011-07-16 NOTE — Telephone Encounter (Signed)
PT CANCELLED PROCEDURE, DOES NOT WANT TO CONTINUE WITH PROCEDURE WHILE ON HIS BLOOD THINNER

## 2011-07-16 NOTE — Telephone Encounter (Signed)
Tell pt to call us when he is off effient

## 2011-07-16 NOTE — Telephone Encounter (Signed)
PT KNOWS TO CONTACT THE OFFICE AND RESCHEDULE WHEN HE IS OFF HIS MEDICATION

## 2011-07-17 ENCOUNTER — Telehealth: Payer: Self-pay | Admitting: Pulmonary Disease

## 2011-07-17 NOTE — Telephone Encounter (Signed)
Received copies from [facility] on 07/12/11. Forwarded  6pages to Dr. Kriste Basque for review.

## 2011-07-19 ENCOUNTER — Other Ambulatory Visit: Payer: BC Managed Care – PPO | Admitting: Gastroenterology

## 2011-07-23 ENCOUNTER — Other Ambulatory Visit: Payer: Self-pay | Admitting: *Deleted

## 2011-07-23 MED ORDER — PRASUGREL HCL 10 MG PO TABS: 10.0000 mg | ORAL_TABLET | Freq: Every day | ORAL | Status: DC

## 2011-08-29 NOTE — Progress Notes (Signed)
Addended by: Maple Hudson on: 08/29/2011 02:52 PM   Modules accepted: Level of Service

## 2011-09-13 ENCOUNTER — Telehealth: Payer: Self-pay | Admitting: Cardiology

## 2011-09-13 NOTE — Telephone Encounter (Signed)
Based on pts cardiac history - he does not need pre-medication.

## 2011-09-13 NOTE — Telephone Encounter (Signed)
Does he need pre meds before cleaning/  Please call her back

## 2011-09-20 ENCOUNTER — Telehealth: Payer: Self-pay | Admitting: Cardiology

## 2011-09-20 NOTE — Telephone Encounter (Signed)
Last lab work was 6/29 and included lipid/liver/BMP/CBC/PSA.  Everything was normal and lipid were excellent.  Will review with Dr Antoine Poche to see if there are any labs he would like.

## 2011-09-20 NOTE — Telephone Encounter (Signed)
Pt wants to know if he should get blood work before appt on 1023

## 2011-09-21 NOTE — Telephone Encounter (Signed)
No other labs needed at this time.

## 2011-09-24 ENCOUNTER — Other Ambulatory Visit: Payer: Self-pay | Admitting: Cardiology

## 2011-09-25 ENCOUNTER — Encounter: Payer: Self-pay | Admitting: Cardiology

## 2011-09-25 ENCOUNTER — Ambulatory Visit (INDEPENDENT_AMBULATORY_CARE_PROVIDER_SITE_OTHER): Payer: BC Managed Care – PPO | Admitting: Cardiology

## 2011-09-25 VITALS — BP 108/67 | HR 69 | Resp 18 | Ht 71.0 in | Wt 188.4 lb

## 2011-09-25 DIAGNOSIS — I10 Essential (primary) hypertension: Secondary | ICD-10-CM

## 2011-09-25 DIAGNOSIS — I2510 Atherosclerotic heart disease of native coronary artery without angina pectoris: Secondary | ICD-10-CM

## 2011-09-25 DIAGNOSIS — E785 Hyperlipidemia, unspecified: Secondary | ICD-10-CM

## 2011-09-25 NOTE — Assessment & Plan Note (Signed)
The blood pressure is at target. No change in medications is indicated. We will continue with therapeutic lifestyle changes (TLC).  

## 2011-09-25 NOTE — Assessment & Plan Note (Signed)
The patient has no new sypmtoms.  No further cardiovascular testing is indicated.  We will continue with aggressive risk reduction and meds as listed.  

## 2011-09-25 NOTE — Assessment & Plan Note (Signed)
His HDL was 51.6 and LDL 64 recently.  No change in therapy is indicated.  Lab Results  Component Value Date   CHOL 124 05/31/2011   HDL 51.60 05/31/2011   LDLCALC 64 05/31/2011   TRIG 44.0 05/31/2011   CHOLHDL 2 05/31/2011

## 2011-09-25 NOTE — Patient Instructions (Signed)
The current medical regimen is effective;  continue present plan and medications.  Follow up in 1 year with Dr Hochrein.  You will receive a letter in the mail 2 months before you are due.  Please call us when you receive this letter to schedule your follow up appointment.  

## 2011-09-25 NOTE — Progress Notes (Signed)
HPI The patient presents for follow up s/p stenting earlier this year.  Since I last saw him he has done well. The patient denies any new symptoms such as chest discomfort, neck or arm discomfort. There has been no new shortness of breath, PND or orthopnea. There have been no reported palpitations, presyncope or syncope. He isn't walking as much as he should.  However, he is having no symptoms with the activity that he does.  No Known Allergies  Current Outpatient Prescriptions  Medication Sig Dispense Refill  . aspirin 81 MG tablet Take 81 mg by mouth daily.        Marland Kitchen atorvastatin (LIPITOR) 20 MG tablet Take 10 mg by mouth daily.        . metoprolol succinate (TOPROL XL) 25 MG 24 hr tablet Take 1 tablet (25 mg total) by mouth daily.  30 tablet  11  . NIASPAN 1000 MG CR tablet TAKE ONE AND A HALF (1 & 1/2) TABLET(S) ONCE DAILY  45 tablet  0  . prasugrel (EFFIENT) 10 MG TABS Take 1 tablet (10 mg total) by mouth daily.  30 tablet  6  . ramipril (ALTACE) 2.5 MG capsule Take 2.5 mg by mouth daily.          Past Medical History  Diagnosis Date  . Diverticulosis of colon   . Myocardial infarction     RCA occlusion with 2 drug-eluting stents 2004, 90% stenosis distal to the stents in the same artery treated with drug-eluting stenting February 2012  . Melanoma   . Dyslipidemia   . Coronary artery disease   . Hypertension   . Hyperlipidemia     Past Surgical History  Procedure Date  . Ptca   . Colonoscopy     ROS:  As stated in the HPI and negative for all other systems.  PHYSICAL EXAM BP 108/67  Pulse 69  Resp 18  Ht 5\' 11"  (1.803 m)  Wt 188 lb 6.4 oz (85.458 kg)  BMI 26.28 kg/m2 GENERAL:  Well appearing HEENT:  Pupils equal round and reactive, fundi not visualized, oral mucosa unremarkable NECK:  No jugular venous distention, waveform within normal limits, carotid upstroke brisk and symmetric, no bruits, no thyromegaly LYMPHATICS:  No cervical, inguinal adenopathy LUNGS:  Clear  to auscultation bilaterally BACK:  No CVA tenderness CHEST:  Unremarkable HEART:  PMI not displaced or sustained,S1 and S2 within normal limits, no S3, no S4, no clicks, no rubs, no murmurs ABD:  Flat, positive bowel sounds normal in frequency in pitch, no bruits, no rebound, no guarding, no midline pulsatile mass, no hepatomegaly, no splenomegaly EXT:  2 plus pulses throughout, no edema, no cyanosis no clubbing SKIN:  No rashes no nodules NEURO:  Cranial nerves II through XII grossly intact, motor grossly intact throughout PSYCH:  Cognitively intact, oriented to person place and time  EKG:  Sinus rhythm, rate 70, axis within normal limits, intervals within normal limits, no acute ST-T wave changes.   ASSESSMENT AND PLAN

## 2011-09-25 NOTE — Telephone Encounter (Signed)
Pt aware.

## 2011-10-29 ENCOUNTER — Other Ambulatory Visit: Payer: Self-pay | Admitting: Cardiology

## 2012-01-07 ENCOUNTER — Telehealth: Payer: Self-pay | Admitting: Cardiology

## 2012-01-07 ENCOUNTER — Ambulatory Visit (INDEPENDENT_AMBULATORY_CARE_PROVIDER_SITE_OTHER): Payer: BC Managed Care – PPO | Admitting: Physician Assistant

## 2012-01-07 ENCOUNTER — Encounter: Payer: Self-pay | Admitting: Physician Assistant

## 2012-01-07 VITALS — BP 116/60 | HR 78 | Ht 71.0 in | Wt 192.0 lb

## 2012-01-07 DIAGNOSIS — R209 Unspecified disturbances of skin sensation: Secondary | ICD-10-CM

## 2012-01-07 DIAGNOSIS — I2510 Atherosclerotic heart disease of native coronary artery without angina pectoris: Secondary | ICD-10-CM

## 2012-01-07 DIAGNOSIS — R202 Paresthesia of skin: Secondary | ICD-10-CM | POA: Insufficient documentation

## 2012-01-07 DIAGNOSIS — M79609 Pain in unspecified limb: Secondary | ICD-10-CM

## 2012-01-07 DIAGNOSIS — I10 Essential (primary) hypertension: Secondary | ICD-10-CM

## 2012-01-07 DIAGNOSIS — M79606 Pain in leg, unspecified: Secondary | ICD-10-CM | POA: Insufficient documentation

## 2012-01-07 MED ORDER — NITROGLYCERIN 0.4 MG SL SUBL: 0.4000 mg | SUBLINGUAL_TABLET | SUBLINGUAL | Status: DC | PRN

## 2012-01-07 NOTE — Assessment & Plan Note (Signed)
Etiology not clear at this time.  We'll check labs: Basic metabolic panel, TSH, CBC, B12, folate.  Since he has coronary disease, he is at risk for peripheral tear of disease.  His dorsalis pedis is difficult to palpate on the left  These would certainly be atypical symptoms.  In any event, I will set him up for ABIs.

## 2012-01-07 NOTE — Assessment & Plan Note (Signed)
Controlled.  

## 2012-01-07 NOTE — Assessment & Plan Note (Signed)
Check ABIs as noted.

## 2012-01-07 NOTE — Progress Notes (Signed)
[address] [place], [place]  [address] Phone: [phone] Fax:  [phone]  Date:  01/07/2012   Name:  Juan [name]       DOB:  [birth year] MRN:  [id number]  PCP:  Dr. Kriste Basque  Primary Cardiologist:  Dr. Rollene Rotunda  Primary Electrophysiologist:  None    History of Present Illness: Juan [name] is a 61 y.o. male who presents for  Ora Mcnatt is a 61 yo male with a h/o CAD, s/p MI in 2004. He had DES x 2 to the RCA. Residual stenosis included a 75% dRCA and 25% CFX. His last ETT was 08/31/2010 and his EKGs demonstrated no ischemic changes.  Presented to the office with arm pain and a concerning EKG in 2/12.  LHC done 2/12: OM2 50%, RCA stents patent, mid RCA 90%, EF 60-65%.  PCI: Endeavor DES to the mid RCA.    Over the last 2 weeks, he has noted some tingling in his bilateral great toes.  This seems to occur at night mainly.  He also noted some discoloration in both feet along the dorsal aspect.  He denies any recent injury.  He denies any calf, thigh or buttock pain with exertion.  He denies any pain in his toes.  He denies any back problems.  He denies chest pain, shortness of breath or syncope.  He denies any new medications.  He denies any injury to his feet other than bumping the top of his left foot along some furniture recently.  Past Medical History  Diagnosis Date  . Diverticulosis of colon   . Myocardial infarction     RCA occlusion with 2 drug-eluting stents 2004, 90% stenosis distal to the stents in the same artery treated with drug-eluting stenting February 2012  . Melanoma   . Dyslipidemia   . Coronary artery disease   . Hypertension   . Hyperlipidemia     Current Outpatient Prescriptions  Medication Sig Dispense Refill  . aspirin 81 MG tablet Take 81 mg by mouth daily.        Marland Kitchen atorvastatin (LIPITOR) 20 MG tablet Take 10 mg by mouth daily.        . metoprolol succinate (TOPROL XL) 25 MG 24 hr tablet Take 1 tablet (25 mg total) by  mouth daily.  30 tablet  11  . NIASPAN 1000 MG CR tablet TAKE ONE AND A HALF (1 & 1/2) TABLET(S) ONCE DAILY  45 tablet  6  . nitroGLYCERIN (NITROSTAT) 0.4 MG SL tablet Place 1 tablet (0.4 mg total) under the tongue every 5 (five) minutes as needed.  25 tablet  6  . prasugrel (EFFIENT) 10 MG TABS Take 1 tablet (10 mg total) by mouth daily.  30 tablet  6  . ramipril (ALTACE) 2.5 MG capsule Take 2.5 mg by mouth daily.        Marland Kitchen DISCONTD: nitroGLYCERIN (NITROSTAT) 0.4 MG SL tablet Place 0.4 mg under the tongue every 5 (five) minutes as needed.        Allergies: No Known Allergies  History  Substance Use Topics  . Smoking status: Never Smoker   . Smokeless tobacco: Never Used  . Alcohol Use: No     ROS:  Please see the history of present illness.   Denies fevers, chills, melena, hematochezia, vomiting, diarrhea.  All other systems reviewed and negative.   PHYSICAL EXAM: VS:  BP 116/60  Pulse 78  Ht 5\' 11"  (1.803 m)  Wt 192 lb (  87.091 kg)  BMI 26.78 kg/m2 Well nourished, well developed, in no acute distress HEENT: normal Neck: no JVD Cardiac:  normal S1, S2; RRR; no murmur Lungs:  clear to auscultation bilaterally, no wheezing, rhonchi or rales Abd: soft, nontender, no hepatomegaly Ext: no edema Skin: warm and dry; There is slight darkening of skin tone over the dorsum of the left foot and the medial malleolus region on the right foot Vascular: Posterior tibialis pulses are 2+ bilaterally, dorsalis pedis is 2+ on the right and I cannot appreciate on the left; no embolic phenomenon Neuro:  CNs 2-12 intact, no focal abnormalities noted; Patellar deep tendon reflexes 2+ bilaterally  EKG:  Sinus rhythm, rate 78, normal axis, no acute changes  ASSESSMENT AND PLAN:

## 2012-01-07 NOTE — Telephone Encounter (Signed)
Patient states has numbness and tingling on toes for the last 2 weeks. Now he has some discoloration in right ankle and on top of left  foot. Patient had a LE stent placement last year. Patient has an appointment to see scott Alben Spittle PA at 2:20 PM. Patient aware.

## 2012-01-07 NOTE — Patient Instructions (Addendum)
Your physician recommends that you schedule a follow-up appointment in: AS SCHEDULED ALREADY  Your physician has requested that you have a lower extremity arterial exercise duplex WITH ABI'S FOR LEG PAIN . During this test, exercise and ultrasound are used to evaluate arterial blood flow in the legs. Allow one hour for this exam. There are no restrictions or special instructions.  Your physician recommends that you return for lab work in: TODAY BMET, CBC W/DIFF, TSH, B-12 FOLATE

## 2012-01-07 NOTE — Assessment & Plan Note (Signed)
Stable.  No chest pain.  Continue dual antiplatelet therapy.  Followup with Dr. Antoine Poche as directed.

## 2012-01-07 NOTE — Telephone Encounter (Signed)
New Msg: Pt calling wanting to speak with nurse/MD with a couple of questions. Pt c/o numbness in toes and discoloration in feet. Pt would like to discuss this with nurse/MD. Please return pt call to discuss further.

## 2012-01-08 LAB — BASIC METABOLIC PANEL
BUN: 13 mg/dL (ref 6–23)
CO2: 31 mEq/L (ref 19–32)
Chloride: 104 mEq/L (ref 96–112)
Creatinine, Ser: 1 mg/dL (ref 0.4–1.5)
Potassium: 4.5 mEq/L (ref 3.5–5.1)

## 2012-01-08 LAB — CBC WITH DIFFERENTIAL/PLATELET
Basophils Relative: 0.4 % (ref 0.0–3.0)
Eosinophils Absolute: 0 10*3/uL (ref 0.0–0.7)
Eosinophils Relative: 0.3 % (ref 0.0–5.0)
HCT: 43 % (ref 39.0–52.0)
Lymphs Abs: 2.1 10*3/uL (ref 0.7–4.0)
MCHC: 32.9 g/dL (ref 30.0–36.0)
MCV: 86.6 fl (ref 78.0–100.0)
Monocytes Absolute: 0.7 10*3/uL (ref 0.1–1.0)
Platelets: 239 10*3/uL (ref 150.0–400.0)
WBC: 9.9 10*3/uL (ref 4.5–10.5)

## 2012-01-08 LAB — VITAMIN B12: Vitamin B-12: 453 pg/mL (ref 211–911)

## 2012-01-25 ENCOUNTER — Encounter (INDEPENDENT_AMBULATORY_CARE_PROVIDER_SITE_OTHER): Payer: BC Managed Care – PPO

## 2012-01-25 DIAGNOSIS — I2510 Atherosclerotic heart disease of native coronary artery without angina pectoris: Secondary | ICD-10-CM

## 2012-01-25 DIAGNOSIS — R202 Paresthesia of skin: Secondary | ICD-10-CM

## 2012-01-25 DIAGNOSIS — I10 Essential (primary) hypertension: Secondary | ICD-10-CM

## 2012-01-25 DIAGNOSIS — M79606 Pain in leg, unspecified: Secondary | ICD-10-CM

## 2012-01-25 DIAGNOSIS — I739 Peripheral vascular disease, unspecified: Secondary | ICD-10-CM

## 2012-02-25 ENCOUNTER — Other Ambulatory Visit: Payer: Self-pay

## 2012-02-25 ENCOUNTER — Other Ambulatory Visit: Payer: Self-pay | Admitting: Cardiology

## 2012-02-25 MED ORDER — ATORVASTATIN CALCIUM 20 MG PO TABS: ORAL_TABLET | ORAL | Status: DC

## 2012-02-25 NOTE — Telephone Encounter (Signed)
..   Requested Prescriptions   Signed Prescriptions Disp Refills  . atorvastatin (LIPITOR) 20 MG tablet 15 tablet 6    Sig: Take 1/2 tablet once daily    Authorizing Provider: Rollene Rotunda    Ordering User: Rachyl Wuebker M  sent a new rx the first one did not have any refills

## 2012-02-25 NOTE — Telephone Encounter (Signed)
...   Requested Prescriptions   Signed Prescriptions Disp Refills  . ramipril (ALTACE) 2.5 MG capsule 30 capsule 6    Sig: TAKE ONE (1) CAPSULE(S) ONCE DAILY    Authorizing Provider: Rollene Rotunda    Ordering User: Kristianna Saperstein M  . atorvastatin (LIPITOR) 20 MG tablet 15 tablet 0    Sig: TAKE 1/2 TABLET(S) ONCE DAILY    Authorizing Provider: Rollene Rotunda    Ordering User: Christella Hartigan, Breniya Goertzen Judie Petit

## 2012-03-05 ENCOUNTER — Other Ambulatory Visit: Payer: Self-pay | Admitting: Cardiology

## 2012-04-08 ENCOUNTER — Other Ambulatory Visit: Payer: Self-pay | Admitting: *Deleted

## 2012-04-08 DIAGNOSIS — I10 Essential (primary) hypertension: Secondary | ICD-10-CM

## 2012-04-08 DIAGNOSIS — I2510 Atherosclerotic heart disease of native coronary artery without angina pectoris: Secondary | ICD-10-CM

## 2012-04-08 MED ORDER — METOPROLOL SUCCINATE ER 25 MG PO TB24: 25.0000 mg | ORAL_TABLET | Freq: Every day | ORAL | Status: DC

## 2012-05-27 ENCOUNTER — Telehealth: Payer: Self-pay | Admitting: Cardiology

## 2012-05-27 NOTE — Telephone Encounter (Signed)
Please return call to patient at [phone] regarding medication discussion.

## 2012-05-27 NOTE — Telephone Encounter (Signed)
Spoke with pt who is being scheduled for an injection into shoulder for chronic pain and needs to know when he should hold his Effient - will forward to MD for review.

## 2012-05-27 NOTE — Telephone Encounter (Signed)
He can hold his Effient for 7 days prior to the procedure.

## 2012-05-28 NOTE — Telephone Encounter (Signed)
Pt aware and will call back with questions or concerns

## 2012-06-09 ENCOUNTER — Ambulatory Visit: Payer: BC Managed Care – PPO | Admitting: Pulmonary Disease

## 2012-07-07 ENCOUNTER — Telehealth: Payer: Self-pay | Admitting: Pulmonary Disease

## 2012-07-07 NOTE — Telephone Encounter (Signed)
Please advise what labs pt will need for 1 year ROV. thanks

## 2012-07-08 ENCOUNTER — Other Ambulatory Visit: Payer: Self-pay | Admitting: Pulmonary Disease

## 2012-07-08 DIAGNOSIS — Z0000 Encounter for general adult medical examination without abnormal findings: Secondary | ICD-10-CM

## 2012-07-08 NOTE — Telephone Encounter (Signed)
Called and lmom for pt to call back.  Order for labs have been placed in the computer for the pt and will wait for his call back to make him aware.

## 2012-07-08 NOTE — Telephone Encounter (Signed)
Pt returned my call and he is aware of labs in the computer.

## 2012-07-11 ENCOUNTER — Other Ambulatory Visit (INDEPENDENT_AMBULATORY_CARE_PROVIDER_SITE_OTHER): Payer: BC Managed Care – PPO

## 2012-07-11 DIAGNOSIS — Z0000 Encounter for general adult medical examination without abnormal findings: Secondary | ICD-10-CM

## 2012-07-11 LAB — BASIC METABOLIC PANEL
CO2: 29 mEq/L (ref 19–32)
Chloride: 106 mEq/L (ref 96–112)
Creatinine, Ser: 1 mg/dL (ref 0.4–1.5)
Glucose, Bld: 94 mg/dL (ref 70–99)
Sodium: 141 mEq/L (ref 135–145)

## 2012-07-11 LAB — HEPATIC FUNCTION PANEL
ALT: 19 U/L (ref 0–53)
Albumin: 3.6 g/dL (ref 3.5–5.2)
Alkaline Phosphatase: 65 U/L (ref 39–117)
Bilirubin, Direct: 0.1 mg/dL (ref 0.0–0.3)
Total Protein: 6.6 g/dL (ref 6.0–8.3)

## 2012-07-11 LAB — PSA: PSA: 1.67 ng/mL (ref 0.10–4.00)

## 2012-07-11 LAB — CBC WITH DIFFERENTIAL/PLATELET
Basophils Absolute: 0 10*3/uL (ref 0.0–0.1)
Eosinophils Absolute: 0.1 10*3/uL (ref 0.0–0.7)
Lymphocytes Relative: 25.2 % (ref 12.0–46.0)
MCHC: 32.4 g/dL (ref 30.0–36.0)
Neutrophils Relative %: 63.2 % (ref 43.0–77.0)
Platelets: 228 10*3/uL (ref 150.0–400.0)
RDW: 14.6 % (ref 11.5–14.6)

## 2012-07-11 LAB — LIPID PANEL
Total CHOL/HDL Ratio: 3
Triglycerides: 117 mg/dL (ref 0.0–149.0)

## 2012-07-15 ENCOUNTER — Encounter: Payer: Self-pay | Admitting: Pulmonary Disease

## 2012-07-15 ENCOUNTER — Ambulatory Visit (INDEPENDENT_AMBULATORY_CARE_PROVIDER_SITE_OTHER): Payer: BC Managed Care – PPO | Admitting: Pulmonary Disease

## 2012-07-15 ENCOUNTER — Ambulatory Visit (INDEPENDENT_AMBULATORY_CARE_PROVIDER_SITE_OTHER)
Admission: RE | Admit: 2012-07-15 | Discharge: 2012-07-15 | Disposition: A | Discharge status: Home / Self Care | Payer: BC Managed Care – PPO | Source: Ambulatory Visit | Attending: Pulmonary Disease | Admitting: Pulmonary Disease

## 2012-07-15 VITALS — BP 110/68 | HR 72 | Temp 97.2°F | Ht 71.0 in | Wt 194.0 lb

## 2012-07-15 DIAGNOSIS — E785 Hyperlipidemia, unspecified: Secondary | ICD-10-CM

## 2012-07-15 DIAGNOSIS — Z85820 Personal history of malignant melanoma of skin: Secondary | ICD-10-CM

## 2012-07-15 DIAGNOSIS — K5730 Diverticulosis of large intestine without perforation or abscess without bleeding: Secondary | ICD-10-CM

## 2012-07-15 DIAGNOSIS — M1990 Unspecified osteoarthritis, unspecified site: Secondary | ICD-10-CM

## 2012-07-15 DIAGNOSIS — Z0000 Encounter for general adult medical examination without abnormal findings: Secondary | ICD-10-CM

## 2012-07-15 DIAGNOSIS — N400 Enlarged prostate without lower urinary tract symptoms: Secondary | ICD-10-CM

## 2012-07-15 DIAGNOSIS — I10 Essential (primary) hypertension: Secondary | ICD-10-CM

## 2012-07-15 DIAGNOSIS — I2510 Atherosclerotic heart disease of native coronary artery without angina pectoris: Secondary | ICD-10-CM

## 2012-07-15 NOTE — Progress Notes (Signed)
Subjective:    Patient ID: Juan [name], male    DOB: May 28, 1951, 61 y.o.   MRN: [id number]  HPI 60 y/o WM (goes by "Juan [name]"- a good friend of Juan [name])) re-established in 2012 after a 69yr hiatus>  He has HBP, CAD- s/pMI, & Hypercholesterolemia followed regularly by DrHohrein; and Hx Melanomas x3 in the past w/ regular f/u by DrHTurner (now LLomax) [facility]...  ~  June 05, 2011:  Approx 29yr ROV & re-establish> SEE UPDATED PROB LIST BELOW>>  His chief complaint is a one month hx of right hand weakness & discomfort in the hand and wrist that started when he pulled himself up onto a truck (?etiology- ?CTS, ?tendonitis, ?other) & we discussed need to refer to Hand specialist...  ~  July 15, 2012:  64mo ROV & CPX> Juan [name] is now 49 & doing well overall- his CC is shoulder pain for which he is seeing Ortho & currently doing PT; he had a good yr w/ one prob in Feb when he presented to [facility] c/o paresthesias in his toes> neg exam & pulses seemed ok; ABIs checked w/ normal flows & normal toe pressures etc (& he was reassured)...    HBP> on MetopER25 & Ramipril2.5; BP= 110/68 & he denies CP, palpit, SOB, edema, etc...    CAD> on ASA81, Effient10; doing satis w/o CP 7 hasn't needed NTG etc; usually plays golf, walks, etc but curtailed due to shoulder pain at present...    CHOL> on Lip10 & Niasp1500mg /d; FLP looks good w/ TChol 145, TG 117, HDL 50, LDL 71    GI> Divertics> he is overdue for colonoscopy but DrHochrein didn't want him to stop the Effient for the 5-7d prior last yr; he has f/u w/ Cards 10/13 & if cleared to stop the Effient he will call DrKaplan for the needed 10+yr f/u colonoscopy...    HxMelanomas> he's had 3 & followed very closely by Ignacia Felling now q60mo & no recurrent lesions identified...    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 LABS 8/13:  FLP- at goals on Lip10&Niasp;   Cghems- wnl;  CBC- wnl;  TSH=3.16;  TSH=1.67    Problem List:  HBP> Controlled on TOPROL XL 25mg /d, and ALTACE 2.5mg /d...  ~  7/12: BP=120/70, tolerating meds well> denies HA, visual symptoms, CP,palpit, dizziness, syncope, dyspnea, edema, etc... ~  8/13:  BP= 110/68 & he denies CP, palpit, SOB, edema, etc... 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  CAD, s/p MI & PTCA/ stents> On above meds + EFFIENT 10mg /d, and ASA 81mg /d... ~  2004:  Hx IWMI w/ cath & RCA occlusion; s/p PTCA & 2 stents placed; this occurred in [place] ... ~  9/11:  Standard Treadmill Stress Test by DrHochrein was neg> no EKG changes noted, good exercise tolerance... ~  2/12:  Hosp w/ unstable angina; cath showed 90% midRCA stenosis; s/p PTCA/ DES & placed on EFFIENT; also had 50% OM-2 lesion, otherw neg w/ EF=60-65% ~  4/12:  F/u by DrHochrein> stable, no new complaints, no changes made...  HYPERCHOLESTEROLEMIA> Controlled on LIPITOR 20mg - taking 1/2 tab daily x yrs, and NIASPAN 1000mg - taking 1.5 tabs Qhs... ~  FLP 2/12 on Lip10+Niasp1500 showed TChol 115, TG 184, HDL 34, LDL 44 ~  FLP 6/12 on Lip10+Niasp1500 showed TChol 124, TG 44, HDL 52,  LDL 64 ~  FLP 8/13 on Lip10+Niasp1500 showed TChol 145, TG 117, HDL 50, LDL 71  DIVERTICULOSIS> I reviewed old paper chart & last colonoscopy was 1/01 by Juan [name] revealing only diverticulosis & rec for f/u colon in 10 years... ~  7/12:  We will refer back to GI to set up f/u colonoscopy at his convenience==> DrHochrein did not want pt to stop Effient for the 5-7d required to do the colon. ~  8/13:  He has a follow up w/ DrHochrein for Cards 10/13 & will inquire about stopping the effient at this point to safely get his colonoscopy...  BPH & Hx HEMATURIA> He was evaluated by DrPeterson 9/11 after an episode of hematuria> note reviewed, neg eval... 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 Hx of Left Shoulder Pain> He notes prev hx left shoulder discomfort w/ shot given by [facility]==> he will f/u w/ them... New onset Right Wrist/Hand Pain>  C/o some right hand weakness & discomfort x 532mo ever since he reached & pulled himself up on a truck; we discussed referral to [facility]Gramig at [facility] for further eval...  Hx MELANOMAS> He reports hx 3 separate Melanomas: 1st on left upper chest wall at age 10;  2nd on mid back in early 2000s;  3rd on medial left thigh above the knee w/ wide excision 12/11 & he reports that "they got it all... We don't have records (nothing in [place], or [employer])... He continues to follow up w/ [facility] & now sees DrLLomax every 32mo...  HEALTH MAINTENANCE: ~  GI:  Followed by DrKaplan> last colon 2001 & f/u due now, we will set up referral... ~  GU:  Followed by DrPeterson; neg hematuria eval 2011 & PSAs are normal... ~  Immunizations:  He refuses Flu vaccines ever since a shots yrs ago gave him the Flu;  Pneumovax will be due at 63;  TDAP given 7/12;  Discussed Shingles vaccine & he will check his insurance coverage...   Past Medical History  Diagnosis Date  . Diverticulosis of colon   . Myocardial infarction     RCA occlusion with 2 drug-eluting stents 2004, 90% stenosis distal to the stents in the same artery treated with drug-eluting stenting February 2012  . Melanoma   . Dyslipidemia   . Coronary artery disease   . Hypertension   . Hyperlipidemia     Past Surgical History  Procedure Date  . Ptca   . Colonoscopy     Outpatient Encounter Prescriptions as of 07/15/2012  Medication Sig Dispense Refill  . aspirin 81 MG tablet Take 81 mg by mouth daily.        Marland Kitchen atorvastatin (LIPITOR) 20 MG tablet Take 1/2 tablet once daily  15 tablet  6  . EFFIENT 10 MG TABS TAKE ONE TABLET BY MOUTH ONE TIME DAILY  30 tablet  6  . metoprolol succinate (TOPROL XL) 25 MG 24 hr tablet Take 1  tablet (25 mg total) by mouth daily.  30 tablet  6  . NIASPAN 1000 MG CR tablet TAKE ONE AND A HALF (1 & 1/2) TABLET(S) ONCE DAILY  45 tablet  6  . nitroGLYCERIN (NITROSTAT) 0.4 MG SL tablet Place 1 tablet (0.4 mg total) under the tongue every 5 (five) minutes as needed.  25 tablet  6  . ramipril (ALTACE) 2.5 MG capsule TAKE ONE (  1) CAPSULE(S) ONCE DAILY  30 capsule  6    No Known Allergies   Current Medications, Allergies, Past Medical History, Past Surgical History, Family History, and Social History were reviewed in [employer] record.   Review of Systems    Constitutional:  Denies F/C/S, anorexia, unexpected weight change. HEENT:  No HA, visual changes, earache, nasal symptoms, sore throat, hoarseness. Resp:  No cough, sputum, hemoptysis; no SOB, tightness, wheezing. Cardio:  No CP, palpit, DOE, orthopnea, edema. GI:  Denies N/V/D/C or blood in stool; no reflux, abd pain, distention, or gas. GU:  No dysuria, freq, urgency, hematuria, or flank pain. MS:  He notes some discomfort involving his right wrist/ hand; no neck pain, back pain, etc. Neuro:  No tremors, seizures, dizziness, syncope, weakness, numbness, gait abn. Skin:  No suspicious lesions or skin rash. Heme:  No adenopathy, bruising, bleeding. Psyche: Denies confusion, sleep disturbance, hallucinations, anxiety, depression.   Objective:   Physical Exam    WD, WN, 61 y/o WM in NAD... Vital Signs:  Reviewed...  General:  Alert & oriented; pleasant & cooperative... HEENT:  [place]/AT, EOM-wnl, PERRLA, Fundi-benign, EACs-clear, TMs-wnl, NOSE-clear, THROAT-clear & wnl. Neck:  Supple w/ full ROM; no JVD; normal carotid impulses w/o bruits; no thyromegaly or nodules palpated; no lymphadenopathy. Chest:  Clear to P & A; without wheezes/ rales/ or rhonchi heard... Heart:  Regular Rhythm; norm S1 & S2 without murmurs/ rubs/ or gallops detected... Abdomen:  Soft & nontender; normal bowel sounds; no organomegaly  or masses palpated... Ext:  Normal ROM; without deformities or arthritic changes; no varicose veins, venous insuffic, or edema;  Pulses intact w/o bruits... Neuro:  CNs II-XII intact; motor testing normal; sensory testing normal; gait normal & balance OK... Derm:  No lesions noted; no rash, +actinic changes, fair skined & red haired... Lymph:  No cervical, supraclavicular, axillary, or inguinal adenopathy palpated...   RADIOLOGY DATA:  Reviewed in the EPIC EMR & discussed w/ the patient...  LABORATORY DATA:  Reviewed in the EPIC EMR & discussed w/ the patient...   Assessment & Plan:   CPX>>   HBP>  Controlled on meds; continue same...  CAD>  Followed by DrHochrein & doing well on current regimen; he has participated in cardiac rehab...  CHOL>  Well controlled on his Lip10 & Niaspan (+diet & exercise)...  Divertics>  He is due for his screening colon & we will refer...  DJD>  His CC is left shoulder pain, it is limiting his golf etc; he is getting PT from [facility]...  Hx Melanomas>  He is followed regularly by Derm as he has had 3 separate melanomas...   Patient's Medications  New Prescriptions   No medications on file  Previous Medications   ASPIRIN 81 MG TABLET    Take 81 mg by mouth daily.     ATORVASTATIN (LIPITOR) 20 MG TABLET    Take 1/2 tablet once daily   EFFIENT 10 MG TABS    TAKE ONE TABLET BY MOUTH ONE TIME DAILY   METOPROLOL SUCCINATE (TOPROL XL) 25 MG 24 HR TABLET    Take 1 tablet (25 mg total) by mouth daily.   NIASPAN 1000 MG CR TABLET    TAKE ONE AND A HALF (1 & 1/2) TABLET(S) ONCE DAILY   NITROGLYCERIN (NITROSTAT) 0.4 MG SL TABLET    Place 1 tablet (0.4 mg total) under the tongue every 5 (five) minutes as needed.   RAMIPRIL (ALTACE) 2.5 MG CAPSULE    TAKE  ONE (1) CAPSULE(S) ONCE DAILY  Modified Medications   No medications on file  Discontinued Medications   No medications on file

## 2012-07-15 NOTE — Patient Instructions (Addendum)
Today we updated your med list in our EPIC system...    Continue your current medications the same...  Today we reviewed your recent blood work 7 gave you a copy for your records...  We also did a follow up CXR & we will call you w/ this result...  Keep up the good work w/ your diet & try to increase your exercise program...  Call for any questions...  Let's plan a follow up physical in 1 yr.Marland KitchenMarland Kitchen

## 2012-08-05 ENCOUNTER — Encounter: Payer: Self-pay | Admitting: Gastroenterology

## 2012-08-13 ENCOUNTER — Ambulatory Visit: Payer: BC Managed Care – PPO | Admitting: Critical Care Medicine

## 2012-09-25 ENCOUNTER — Ambulatory Visit (INDEPENDENT_AMBULATORY_CARE_PROVIDER_SITE_OTHER): Payer: BC Managed Care – PPO | Admitting: Cardiology

## 2012-09-25 ENCOUNTER — Encounter: Payer: Self-pay | Admitting: Cardiology

## 2012-09-25 VITALS — BP 114/67 | HR 66 | Ht 71.0 in | Wt 194.4 lb

## 2012-09-25 DIAGNOSIS — I2510 Atherosclerotic heart disease of native coronary artery without angina pectoris: Secondary | ICD-10-CM

## 2012-09-25 DIAGNOSIS — I10 Essential (primary) hypertension: Secondary | ICD-10-CM

## 2012-09-25 DIAGNOSIS — E785 Hyperlipidemia, unspecified: Secondary | ICD-10-CM

## 2012-09-25 NOTE — Patient Instructions (Addendum)
Please stop your Effient. Continue all other medications as listed  Follow up in 1 year with Dr Hochrein.  You will receive a letter in the mail 2 months before you are due.  Please call us when you receive this letter to schedule your follow up appointment.  

## 2012-09-25 NOTE — Progress Notes (Signed)
   HPI The patient presents for follow up s/p stenting last year.  Since I last saw him he has done well. The patient denies any new symptoms such as chest discomfort, neck or arm discomfort. There has been no new shortness of breath, PND or orthopnea. There have been no reported palpitations, presyncope or syncope. 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  He is not exercising routinely although he is active.    No Known Allergies  Current Outpatient Prescriptions  Medication Sig Dispense Refill  . aspirin 81 MG tablet Take 81 mg by mouth daily.        Marland Kitchen atorvastatin (LIPITOR) 20 MG tablet Take 1/2 tablet once daily  15 tablet  6  . EFFIENT 10 MG TABS TAKE ONE TABLET BY MOUTH ONE TIME DAILY  30 tablet  6  . metoprolol succinate (TOPROL XL) 25 MG 24 hr tablet Take 1 tablet (25 mg total) by mouth daily.  30 tablet  6  . NIASPAN 1000 MG CR tablet TAKE ONE AND A HALF (1 & 1/2) TABLET(S) ONCE DAILY  45 tablet  6  . nitroGLYCERIN (NITROSTAT) 0.4 MG SL tablet Place 1 tablet (0.4 mg total) under the tongue every 5 (five) minutes as needed.  25 tablet  6  . ramipril (ALTACE) 2.5 MG capsule TAKE ONE (1) CAPSULE(S) ONCE DAILY  30 capsule  6    Past Medical History  Diagnosis Date  . Diverticulosis of colon   . Myocardial infarction     RCA occlusion with 2 drug-eluting stents 2004, 90% stenosis distal to the stents in the same artery treated with drug-eluting stenting February 2012  . Melanoma   . Dyslipidemia   . Coronary artery disease   . Hypertension   . Hyperlipidemia     Past Surgical History  Procedure Date  . Ptca   . Colonoscopy     ROS:  As stated in the HPI and negative for all other systems.  PHYSICAL EXAM BP 114/67  Pulse 66  Ht 5\' 11"  (1.803 m)  Wt 194 lb 6.4 oz (88.179 kg)  BMI 27.11 kg/m2 GENERAL:  Well appearing NECK:  No jugular venous distention, waveform within normal limits, carotid upstroke brisk and symmetric, no bruits, no thyromegaly LUNGS:  Clear to  auscultation bilaterally BACK:  No CVA tenderness CHEST:  Unremarkable HEART:  PMI not displaced or sustained,S1 and S2 within normal limits, no S3, no S4, no clicks, no rubs, no murmurs ABD:  Flat, positive bowel sounds normal in frequency in pitch, no bruits, no rebound, no guarding, no midline pulsatile mass, no hepatomegaly, no splenomegaly EXT:  2 plus pulses throughout, no edema, no cyanosis no clubbing  EKG:  Sinus rhythm, rate 70, axis within normal limits, intervals within normal limits, no acute ST-T wave changes.   ASSESSMENT AND PLAN  CORONARY ARTERY DISEASE -  The patient has no new sypmtoms. No further cardiovascular testing is indicated. We will continue with aggressive risk reduction and meds as listed. He can stop the Effient.  HYPERTENSION - The blood pressure is at target. No change in medications is indicated. We will continue with therapeutic lifestyle changes (TLC).   DYSLIPIDEMIA -  His HDL was 50.3 and LDL 71recently. No change in therapy is indicated.

## 2012-11-04 ENCOUNTER — Other Ambulatory Visit: Payer: Self-pay | Admitting: Cardiology

## 2012-11-25 ENCOUNTER — Telehealth: Payer: Self-pay | Admitting: Cardiology

## 2012-11-25 NOTE — Telephone Encounter (Signed)
Patient denied any chest pains, radiating pain, but stated he was having a lot of belching.  Patient has no way of checking his blood pressure at home.  Advised to contact PCP or go to Urgent Care to be evaluated. Will forward to Dr Antoine Poche and Orlie Dakin RN

## 2012-11-25 NOTE — Telephone Encounter (Signed)
New problem:    C/O heart burn last night , lightheadedness,  not feeling well. PCP was not contacted. Would like to be seen today.

## 2012-12-09 ENCOUNTER — Other Ambulatory Visit: Payer: Self-pay

## 2012-12-09 MED ORDER — NIACIN ER (ANTIHYPERLIPIDEMIC) 1000 MG PO TBCR: 1000.0000 mg | EXTENDED_RELEASE_TABLET | Freq: Every day | ORAL | Status: DC

## 2012-12-09 MED ORDER — ATORVASTATIN CALCIUM 20 MG PO TABS: ORAL_TABLET | ORAL | Status: DC

## 2012-12-09 MED ORDER — RAMIPRIL 2.5 MG PO CAPS: 2.5000 mg | ORAL_CAPSULE | Freq: Every day | ORAL | Status: DC

## 2012-12-09 NOTE — Telephone Encounter (Signed)
..   Requested Prescriptions   Signed Prescriptions Disp Refills  . atorvastatin (LIPITOR) 20 MG tablet 15 tablet 6    Sig: Take 1/2 tablet once daily    Authorizing Provider: Rollene Rotunda    Ordering User: Caitrin Pendergraph M  . niacin (NIASPAN) 1000 MG CR tablet 90 tablet 3    Sig: Take 1 tablet (1,000 mg total) by mouth at bedtime.    Authorizing Provider: Rollene Rotunda    Ordering User: Christella Hartigan, Ahmaya Ostermiller Judie Petit

## 2012-12-09 NOTE — Telephone Encounter (Signed)
..   Requested Prescriptions   Signed Prescriptions Disp Refills  . ramipril (ALTACE) 2.5 MG capsule 90 capsule 3    Sig: Take 1 capsule (2.5 mg total) by mouth daily.    Authorizing Provider: Rollene Rotunda    Ordering User: Christella Hartigan, Jessalyn Hinojosa Judie Petit

## 2013-02-26 ENCOUNTER — Telehealth: Payer: Self-pay | Admitting: Pulmonary Disease

## 2013-02-26 NOTE — Telephone Encounter (Signed)
Per SN---   Not to SN knowledge.  90% of the time it caused from mouth breathing at night.   Not much effective therapy for this.  Use water bottle on bedside table, try humidifier, avoid antihistamines. thanks

## 2013-02-26 NOTE — Telephone Encounter (Signed)
I spoke with pt and is aware of SN recs. He voiced his understanding and needed nothing further 

## 2013-02-26 NOTE — Telephone Encounter (Signed)
Pt c/o excessive dry mouth at night x 1-2 months. Pt states that anytime he falls asleep, even for naps her wakes up with the dry mouth.  Pt states this is something that has been new for the past 1-2 months Patient states that he has not started any new medications recently but wasn't sure if this was something that could be along term side effect to something that he is taking currently.   Dr Kriste Basque please advise thanks.

## 2013-07-16 ENCOUNTER — Encounter: Payer: BC Managed Care – PPO | Admitting: Pulmonary Disease

## 2013-07-29 ENCOUNTER — Telehealth: Payer: Self-pay | Admitting: Pulmonary Disease

## 2013-07-29 DIAGNOSIS — Z0000 Encounter for general adult medical examination without abnormal findings: Secondary | ICD-10-CM

## 2013-07-29 NOTE — Telephone Encounter (Signed)
Last OV 07/2012 Pending 08/06/13 for CPX. Please advise SN what labs pt will need thanks

## 2013-07-29 NOTE — Telephone Encounter (Signed)
Called and spoke with pt and he is aware of lab order placed in the computer for his fasting labs prior to his appt with SN.  Nothing further is needed.

## 2013-08-04 ENCOUNTER — Other Ambulatory Visit (INDEPENDENT_AMBULATORY_CARE_PROVIDER_SITE_OTHER): Payer: BC Managed Care – PPO

## 2013-08-04 DIAGNOSIS — Z0000 Encounter for general adult medical examination without abnormal findings: Secondary | ICD-10-CM

## 2013-08-04 LAB — LIPID PANEL: Total CHOL/HDL Ratio: 3

## 2013-08-04 LAB — CBC WITH DIFFERENTIAL/PLATELET
Basophils Absolute: 0 10*3/uL (ref 0.0–0.1)
Eosinophils Absolute: 0.2 10*3/uL (ref 0.0–0.7)
HCT: 43.5 % (ref 39.0–52.0)
Lymphocytes Relative: 26 % (ref 12.0–46.0)
Lymphs Abs: 1.9 10*3/uL (ref 0.7–4.0)
MCHC: 32.9 g/dL (ref 30.0–36.0)
Monocytes Relative: 9.8 % (ref 3.0–12.0)
Neutro Abs: 4.5 10*3/uL (ref 1.4–7.7)
Platelets: 235 10*3/uL (ref 150.0–400.0)
RDW: 14.6 % (ref 11.5–14.6)

## 2013-08-04 LAB — HEPATIC FUNCTION PANEL
AST: 22 U/L (ref 0–37)
Albumin: 3.8 g/dL (ref 3.5–5.2)
Alkaline Phosphatase: 62 U/L (ref 39–117)
Bilirubin, Direct: 0.1 mg/dL (ref 0.0–0.3)
Total Protein: 6.9 g/dL (ref 6.0–8.3)

## 2013-08-04 LAB — BASIC METABOLIC PANEL
CO2: 30 mEq/L (ref 19–32)
Glucose, Bld: 87 mg/dL (ref 70–99)
Potassium: 4.5 mEq/L (ref 3.5–5.1)
Sodium: 140 mEq/L (ref 135–145)

## 2013-08-04 LAB — TSH: TSH: 3.02 u[IU]/mL (ref 0.35–5.50)

## 2013-08-06 ENCOUNTER — Ambulatory Visit (INDEPENDENT_AMBULATORY_CARE_PROVIDER_SITE_OTHER): Payer: BC Managed Care – PPO | Admitting: Pulmonary Disease

## 2013-08-06 ENCOUNTER — Encounter: Payer: Self-pay | Admitting: Gastroenterology

## 2013-08-06 ENCOUNTER — Ambulatory Visit (INDEPENDENT_AMBULATORY_CARE_PROVIDER_SITE_OTHER)
Admission: RE | Admit: 2013-08-06 | Discharge: 2013-08-06 | Disposition: A | Discharge status: Home / Self Care | Payer: BC Managed Care – PPO | Source: Ambulatory Visit | Attending: Pulmonary Disease | Admitting: Pulmonary Disease

## 2013-08-06 ENCOUNTER — Encounter: Payer: Self-pay | Admitting: Pulmonary Disease

## 2013-08-06 VITALS — BP 136/72 | HR 73 | Temp 97.4°F | Ht 71.0 in | Wt 195.6 lb

## 2013-08-06 DIAGNOSIS — K5730 Diverticulosis of large intestine without perforation or abscess without bleeding: Secondary | ICD-10-CM

## 2013-08-06 DIAGNOSIS — I10 Essential (primary) hypertension: Secondary | ICD-10-CM

## 2013-08-06 DIAGNOSIS — Z0000 Encounter for general adult medical examination without abnormal findings: Secondary | ICD-10-CM

## 2013-08-06 DIAGNOSIS — E785 Hyperlipidemia, unspecified: Secondary | ICD-10-CM

## 2013-08-06 DIAGNOSIS — N400 Enlarged prostate without lower urinary tract symptoms: Secondary | ICD-10-CM

## 2013-08-06 DIAGNOSIS — I2510 Atherosclerotic heart disease of native coronary artery without angina pectoris: Secondary | ICD-10-CM

## 2013-08-06 DIAGNOSIS — Z85820 Personal history of malignant melanoma of skin: Secondary | ICD-10-CM

## 2013-08-06 DIAGNOSIS — M1990 Unspecified osteoarthritis, unspecified site: Secondary | ICD-10-CM

## 2013-08-06 MED ORDER — ATORVASTATIN CALCIUM 20 MG PO TABS: ORAL_TABLET | ORAL | Status: DC

## 2013-08-06 NOTE — Progress Notes (Signed)
Subjective:    Patient ID: Juan [name], male    DOB: 06-Apr-1951, 62 y.o.   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  ~  June 05, 2011:  Approx 10yr ROV & re-establish> SEE UPDATED PROB LIST BELOW>>  His chief complaint is a one month hx of right hand weakness & discomfort in the hand and wrist that started when he pulled himself up onto a truck (?etiology- ?CTS, ?tendonitis, ?other) & we discussed need to refer to Hand specialist...  ~  July 15, 2012:  65mo ROV & CPX> Juan [name] is now 31 & doing well overall- his CC is shoulder pain for which he is seeing Ortho & currently doing PT; he had a good yr w/ one prob in Feb when he presented to [facility] c/o paresthesias in his toes> neg exam & pulses seemed ok; ABIs checked w/ normal flows & normal toe pressures etc (& he was reassured)...    HBP> on MetopER25 & Ramipril2.5; BP= 110/68 & he denies CP, palpit, SOB, edema, etc...    CAD> on ASA81, Effient10; doing satis w/o CP 7 hasn't needed NTG etc; usually plays golf, walks, etc but curtailed due to shoulder pain at present...    CHOL> on Lip10 & Niasp1500mg /d; FLP looks good w/ TChol 145, TG 117, HDL 50, LDL 71    GI> Divertics> he is overdue for colonoscopy but DrHochrein didn't want him to stop the Effient for the 5-7d prior last yr; he has f/u w/ Cards 10/13 & if cleared to stop the Effient he will call DrKaplan for the needed 10+yr f/u colonoscopy...    HxMelanomas> he's had 3 & followed very closely by Ignacia Felling now q83mo & no recurrent lesions identified... 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 LABS 8/13:  FLP- at goals on Lip10&Niasp;   Cghems- wnl;  CBC- wnl;  TSH=3.16;  TSH=1.67   ~  August 06, 2013:  Yearly ROV & CPX>     HBP> on Ramipril2.5, he stopped MetoprololER25 on his own; BP= 136/72 & he denies CP, palpit, SOB, edema, etc; he does not want to restart BBlocker- will discuss w/ DrHochrein he says...    CAD> on ASA81 & off Effient10; doing satis w/o CP & hasn't needed NTG etc; usually plays golf, walks, etc...    CHOL> on Lip10 & Niasp1000mg /d; FLP 9/14 shows TChol 132, TG 73, HDL 45, LDL 72    GI> Divertics> he is overdue for f/u colonoscopy w/ DrKaplan & we will refer chart to GI...     HxMelanomas> he's had 3 & followed very closely by Ignacia Felling now q56mo & no recurrent lesions identified...    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  We reviewed prob list, meds, xrays and labs> see below for updates >> he refuses the FLU vaccine CXR 9/14 showed norm heart size, clear lungs, NAD... LABS 9/14:  FLP- at goals on Lip10+Niasp1000;  Chems- nwnl;  CBC- wnl;  TSH=3.02;  PSA=2.15...           Problem List:  HBP >>  ~  Prev controlled on TOPROL XL 25mg /d,  and ALTACE 2.5mg /d...  ~  7/12: BP=120/70, tolerating meds well> denies HA, visual symptoms, CP,palpit, dizziness, syncope, dyspnea, edema, etc... ~  8/13:  BP= 110/68 & he denies CP, palpit, SOB, edema, etc... 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 ~  9/14: on Ramipril2.5, he stopped MetoprololER25 on his own; BP= 136/72 & he denies CP, palpit, SOB, edema, etc; he does not want to restart BBlocker- will discuss w/ DrHochrein he says  CAD, s/p MI & PTCA/ stents >>  ~  2004:  Hx IWMI w/ cath & RCA occlusion; s/p PTCA & 2 stents placed; this occurred in [place]... ~  9/11:  Standard Treadmill Stress Test by DrHochrein was neg> no EKG changes noted, good exercise tolerance... ~  2/12:  Hosp w/ unstable angina; cath showed 90% midRCA stenosis; s/p PTCA/ DES & placed on EFFIENT; also had 50% OM-2  lesion, otherw neg w/ EF=60-65% ~  4/12:  F/u by DrHochrein> stable, no new complaints, no changes made... ~  10/13: he saw DrHochrein> CAD, s/p stent; stable, active, no new symptoms; Effient stopped, otherw no change...   HYPERCHOLESTEROLEMIA> Controlled on LIPITOR 20mg - taking 1/2 tab daily x yrs, and NIASPAN 1000mg - taking 1.5 tabs Qhs... ~  FLP 2/12 on Lip10+Niasp1500 showed TChol 115, TG 184, HDL 34, LDL 44 ~  FLP 6/12 on Lip10+Niasp1500 showed TChol 124, TG 44, HDL 52, LDL 64 ~  FLP 8/13 on Lip10+Niasp1500 showed TChol 145, TG 117, HDL 50, LDL 71 ~  FLP 9/14 on Lip10+Niasp1000 showed TChol 132, TG 73, HDL 45, LDL 72  DIVERTICULOSIS> I reviewed old paper chart & last colonoscopy was 1/01 by Dorris Singh revealing only diverticulosis & rec for f/u colon in 10 years... ~  7/12:  We will refer back to GI to set up f/u colonoscopy at his convenience==> DrHochrein did not want pt to stop Effient for the 5-7d required to do the colon. ~  8/13:  He has a follow up w/ DrHochrein for Cards 10/13 & will inquire about stopping the effient at this point to safely get his colonoscopy... ~  9/14: he has not yet sched his f/u colonoscopy, off Effient since 10/13, we will refer chart to DrKaplan...  BPH & Hx HEMATURIA> He was evaluated by DrPeterson 9/11 after an episode of hematuria> note reviewed, neg eval... 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 Hx of Left Shoulder Pain> He notes prev hx left shoulder discomfort w/ shot given by [facility]==> he will f/u w/ them... New onset Right Wrist/Hand Pain>  C/o some right hand weakness & discomfort x 35mo ever since he reached & pulled himself up on a truck; we discussed referral to [facility]Gramig at [facility] for further eval...  Hx MELANOMAS> He reports hx 3 separate Melanomas: 1st on left upper chest wall at age 54;  2nd on mid back in  early 2000s;  3rd on medial left thigh above the knee w/ wide excision 12/11 & he reports that "they got it all... We don't have records (nothing in [place], or [employer])... He continues to follow up w/ [facility] & now sees DrLLomax every 90mo...  HEALTH MAINTENANCE: ~  GI:  Followed by DrKaplan> last colon 2001 & f/u due now, we will set up referral... ~  GU:  Followed by DrPeterson; neg hematuria eval 2011 & PSAs are normal... ~  Immunizations:  He refuses Flu vaccines ever since a shots yrs ago gave him the Flu;  Pneumovax will be due in 2015 at age 57;  TDAP given 7/12;  Discussed Shingles vaccine & he will check his insurance coverage...   Past Medical History  Diagnosis Date  . Diverticulosis of colon   . Myocardial infarction     RCA occlusion with 2 drug-eluting stents 2004, 90% stenosis distal to the stents in the same artery treated with drug-eluting stenting February 2012  . Melanoma   . Dyslipidemia   . Coronary artery disease   . Hypertension   . Hyperlipidemia     Past Surgical History  Procedure Laterality Date  . Ptca    . Colonoscopy      Outpatient Encounter Prescriptions as of 08/06/2013  Medication Sig Dispense Refill  . aspirin 81 MG tablet Take 81 mg by mouth daily.        Marland Kitchen atorvastatin (LIPITOR) 20 MG tablet Take 1/2 tablet once daily  15 tablet  6  . metoprolol succinate (TOPROL XL) 25 MG 24 hr tablet Take 1 tablet (25 mg total) by mouth daily.  30 tablet  6  . niacin (NIASPAN) 1000 MG CR tablet Take 1 tablet (1,000 mg total) by mouth at bedtime.  90 tablet  3  . nitroGLYCERIN (NITROSTAT) 0.4 MG SL tablet Place 1 tablet (0.4 mg total) under the tongue every 5 (five) minutes as needed.  25 tablet  6  . ramipril (ALTACE) 2.5 MG capsule Take 1 capsule (2.5 mg total) by mouth daily.  90 capsule  3   No facility-administered encounter medications on file as of 08/06/2013.    No Known Allergies   Current Medications, Allergies, Past Medical  History, Past Surgical History, Family History, and Social History were reviewed in [employer] record.   Review of Systems    Constitutional:  Denies F/C/S, anorexia, unexpected weight change. HEENT:  No HA, visual changes, earache, nasal symptoms, sore throat, hoarseness. Resp:  No cough, sputum, hemoptysis; no SOB, tightness, wheezing. Cardio:  No CP, palpit, DOE, orthopnea, edema. GI:  Denies N/V/D/C or blood in stool; no reflux, abd pain, distention, or gas. GU:  No dysuria, freq, urgency, hematuria, or flank pain. MS:  He notes some discomfort involving his right wrist/ hand; no neck pain, back pain, etc. Neuro:  No tremors, seizures, dizziness, syncope, weakness, numbness, gait abn. Skin:  No suspicious lesions or skin rash. Heme:  No adenopathy, bruising, bleeding. Psyche: Denies confusion, sleep disturbance, hallucinations, anxiety, depression.   Objective:   Physical Exam    WD, WN, 62 y/o WM in NAD... Vital Signs:  Reviewed...  General:  Alert & oriented; pleasant & cooperative... HEENT:  [place]/AT, EOM-wnl, PERRLA, Fundi-benign, EACs-clear, TMs-wnl, NOSE-clear, THROAT-clear & wnl. Neck:  Supple w/ full ROM; no JVD; normal carotid impulses w/o bruits; no thyromegaly or nodules palpated; no lymphadenopathy. Chest:  Clear to P & A; without wheezes/ rales/ or rhonchi heard... Heart:  Regular Rhythm; norm S1 & S2 without murmurs/ rubs/ or gallops detected... Abdomen:  Soft & nontender; normal bowel sounds; no organomegaly or masses palpated... Ext:  Normal ROM; without deformities or arthritic changes; no varicose veins, venous insuffic, or edema;  Pulses intact w/o bruits... Neuro:  CNs II-XII intact; motor testing normal; sensory testing normal; gait normal & balance OK... Derm:  No lesions noted; no rash, +actinic changes, fair skined &  red haired... Lymph:  No cervical, supraclavicular, axillary, or inguinal adenopathy palpated...   RADIOLOGY DATA:   Reviewed in the EPIC EMR & discussed w/ the patient...  LABORATORY DATA:  Reviewed in the EPIC EMR & discussed w/ the patient...   Assessment & Plan:   CPX>>   HBP>  Controlled on Altace alone, he stopped BBlocker; he wants to wait & discuss w/ DrHochrein...  CAD>  Followed by DrHochrein & doing well on current regimen; he has participated in cardiac rehab...  CHOL>  Well controlled on his Lip10 & Niaspan (+diet & exercise)...  Divertics>  He is due for his screening colon & we will refer...  DJD>  His CC was left shoulder pain, it is limiting his golf etc; he is getting PT from [facility]...  Hx Melanomas>  He is followed regularly by Derm as he has had 3 separate melanomas...   Patient's Medications  New Prescriptions   No medications on file  Previous Medications   ASPIRIN 81 MG TABLET    Take 81 mg by mouth daily.     METOPROLOL SUCCINATE (TOPROL XL) 25 MG 24 HR TABLET    Take 1 tablet (25 mg total) by mouth daily.   NITROGLYCERIN (NITROSTAT) 0.4 MG SL TABLET    Place 1 tablet (0.4 mg total) under the tongue every 5 (five) minutes as needed.   RAMIPRIL (ALTACE) 2.5 MG CAPSULE    Take 1 capsule (2.5 mg total) by mouth daily.  Modified Medications   Modified Medication Previous Medication   ATORVASTATIN (LIPITOR) 20 MG TABLET atorvastatin (LIPITOR) 20 MG tablet      Take 1 tablet (20 mg total) by mouth daily.    Take 1/2 tablet once daily  Discontinued Medications   NIACIN (NIASPAN) 1000 MG CR TABLET    Take 1 tablet (1,000 mg total) by mouth at bedtime.

## 2013-08-06 NOTE — Patient Instructions (Addendum)
Today we updated your med list in our EPIC system...    Continue your current medications the same...  Today we reviewed your recent FASTING blood work & gave you a copy for your records... We also did your follow up CXR...    We will contact you w/ the results when available...   We will refer your chart to DrKaplan in our GI Dept for your follow up colonoscopy due now...  Keep up the good work w/ diet & exercise...  Call for any questions...  Let's plan a follow up visit in 55yr, sooner if needed for problems.Marland KitchenMarland Kitchen

## 2013-09-18 ENCOUNTER — Encounter: Payer: Self-pay | Admitting: Cardiology

## 2013-09-18 ENCOUNTER — Encounter (INDEPENDENT_AMBULATORY_CARE_PROVIDER_SITE_OTHER): Payer: Self-pay

## 2013-09-18 ENCOUNTER — Ambulatory Visit (INDEPENDENT_AMBULATORY_CARE_PROVIDER_SITE_OTHER): Payer: BC Managed Care – PPO | Admitting: Cardiology

## 2013-09-18 VITALS — BP 128/74 | HR 77 | Ht 71.0 in | Wt 194.0 lb

## 2013-09-18 DIAGNOSIS — I2510 Atherosclerotic heart disease of native coronary artery without angina pectoris: Secondary | ICD-10-CM

## 2013-09-18 DIAGNOSIS — E7800 Pure hypercholesterolemia, unspecified: Secondary | ICD-10-CM

## 2013-09-18 DIAGNOSIS — Z79899 Other long term (current) drug therapy: Secondary | ICD-10-CM

## 2013-09-18 MED ORDER — ATORVASTATIN CALCIUM 20 MG PO TABS: 20.0000 mg | ORAL_TABLET | Freq: Every day | ORAL | Status: DC

## 2013-09-18 NOTE — Patient Instructions (Signed)
Please stop your Niacin Increase your Lipitor to 20 mg a day Continue all other medications as listed  Please return in 8 weeks for fasting lab work (around 11/13/13)  Follow up in 1 year with Dr Antoine Poche.  You will receive a letter in the mail 2 months before you are due.  Please call us when you receive this letter to schedule your follow up appointment.

## 2013-09-18 NOTE — Progress Notes (Signed)
HPI The patient presents for follow up s/p stenting in 2012.  Since I last saw him he has done well. The patient denies any new symptoms such as chest discomfort, neck or arm discomfort. There has been no new shortness of breath, PND or orthopnea. There have been no reported palpitations, presyncope or syncope. 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  He is not exercising routinely.   He did some months get his metoprolol renewed into his been off of this medication since I last saw him. He's had no problems with this.  No Known Allergies  Current Outpatient Prescriptions  Medication Sig Dispense Refill  . aspirin 81 MG tablet Take 81 mg by mouth daily.        Marland Kitchen atorvastatin (LIPITOR) 20 MG tablet Take 1/2 tablet once daily  15 tablet  6  . metoprolol succinate (TOPROL XL) 25 MG 24 hr tablet Take 1 tablet (25 mg total) by mouth daily.  30 tablet  6  . niacin (NIASPAN) 1000 MG CR tablet Take 1 tablet (1,000 mg total) by mouth at bedtime.  90 tablet  3  . nitroGLYCERIN (NITROSTAT) 0.4 MG SL tablet Place 1 tablet (0.4 mg total) under the tongue every 5 (five) minutes as needed.  25 tablet  6  . ramipril (ALTACE) 2.5 MG capsule Take 1 capsule (2.5 mg total) by mouth daily.  90 capsule  3   No current facility-administered medications for this visit.    Past Medical History  Diagnosis Date  . Diverticulosis of colon   . Myocardial infarction     RCA occlusion with 2 drug-eluting stents 2004, 90% stenosis distal to the stents in the same artery treated with drug-eluting stenting February 2012  . Melanoma   . Dyslipidemia   . Coronary artery disease   . Hypertension   . Hyperlipidemia     Past Surgical History  Procedure Laterality Date  . Ptca    . Colonoscopy      ROS:  As stated in the HPI and negative for all other systems.  PHYSICAL EXAM BP 128/74  Pulse 77  Ht 5\' 11"  (1.803 m)  Wt 194 lb (87.998 kg)  BMI 27.07 kg/m2 GENERAL:  Well appearing NECK:  No jugular venous  distention, waveform within normal limits, carotid upstroke brisk and symmetric, no bruits, no thyromegaly LUNGS:  Clear to auscultation bilaterally BACK:  No CVA tenderness CHEST:  Unremarkable HEART:  PMI not displaced or sustained,S1 and S2 within normal limits, no S3, no S4, no clicks, no rubs, no murmurs ABD:  Flat, positive bowel sounds normal in frequency in pitch, no bruits, no rebound, no guarding, no midline pulsatile mass, no hepatomegaly, no splenomegaly EXT:  2 plus pulses throughout, no edema, no cyanosis no clubbing  EKG:  Sinus rhythm, rate 77, axis within normal limits, intervals within normal limits, no acute ST-T wave changes.  09/18/2013   ASSESSMENT AND PLAN  CORONARY ARTERY DISEASE -  The patient has no new sypmtoms. No further cardiovascular testing is indicated. We will continue with aggressive risk reduction and meds as listed.  We again discussed the benefits of exercise.  He can stay off of the beta blocker.  HYPERTENSION - The blood pressure is at target. No change in medications is indicated. We will continue with therapeutic lifestyle changes (TLC).   DYSLIPIDEMIA -  Given recent data and says he is only on niacin for his low HDL I will stop this medicine.  I would  like for him to be on a higher dose of statin however, and I will increase Lipitor to 20 mg.    I did review the lipid profile and other labs done in September.

## 2013-10-07 ENCOUNTER — Ambulatory Visit (AMBULATORY_SURGERY_CENTER): Payer: Self-pay

## 2013-10-07 ENCOUNTER — Encounter: Payer: Self-pay | Admitting: Gastroenterology

## 2013-10-07 VITALS — Ht 71.0 in | Wt 195.0 lb

## 2013-10-07 DIAGNOSIS — Z1211 Encounter for screening for malignant neoplasm of colon: Secondary | ICD-10-CM

## 2013-10-07 MED ORDER — SUPREP BOWEL PREP KIT 17.5-3.13-1.6 GM/177ML PO SOLN: 1.0000 | Freq: Once | ORAL | Status: DC

## 2013-10-20 ENCOUNTER — Encounter: Payer: Self-pay | Admitting: Gastroenterology

## 2013-10-20 ENCOUNTER — Ambulatory Visit (AMBULATORY_SURGERY_CENTER): Payer: BC Managed Care – PPO | Admitting: Gastroenterology

## 2013-10-20 VITALS — BP 128/77 | HR 71 | Temp 97.2°F | Resp 19 | Ht 71.0 in | Wt 195.0 lb

## 2013-10-20 DIAGNOSIS — K5730 Diverticulosis of large intestine without perforation or abscess without bleeding: Secondary | ICD-10-CM

## 2013-10-20 DIAGNOSIS — Z1211 Encounter for screening for malignant neoplasm of colon: Secondary | ICD-10-CM

## 2013-10-20 DIAGNOSIS — K648 Other hemorrhoids: Secondary | ICD-10-CM

## 2013-10-20 MED ORDER — SODIUM CHLORIDE 0.9 % IV SOLN: 500.0000 mL | INTRAVENOUS | Status: DC

## 2013-10-20 NOTE — Progress Notes (Signed)
Patient did not experience any of the following events: a burn prior to discharge; a fall within the facility; wrong site/side/patient/procedure/implant event; or a hospital transfer or hospital admission upon discharge from the facility. (G8907) Patient did not have preoperative order for IV antibiotic SSI prophylaxis. (G8918)  

## 2013-10-20 NOTE — Progress Notes (Signed)
Report to pacu rn, vss, bbs=clear 

## 2013-10-20 NOTE — Patient Instructions (Signed)
Discharge instructions given with verbal understanding. Handouts on diverticulosis and a high fiber diet. Resume previous medications. YOU HAD AN ENDOSCOPIC PROCEDURE TODAY AT THE  ENDOSCOPY CENTER: Refer to the procedure report that was given to you for any specific questions about what was found during the examination.  If the procedure report does not answer your questions, please call your gastroenterologist to clarify.  If you requested that your care partner not be given the details of your procedure findings, then the procedure report has been included in a sealed envelope for you to review at your convenience later.  YOU SHOULD EXPECT: Some feelings of bloating in the abdomen. Passage of more gas than usual.  Walking can help get rid of the air that was put into your GI tract during the procedure and reduce the bloating. If you had a lower endoscopy (such as a colonoscopy or flexible sigmoidoscopy) you may notice spotting of blood in your stool or on the toilet paper. If you underwent a bowel prep for your procedure, then you may not have a normal bowel movement for a few days.  DIET: Your first meal following the procedure should be a light meal and then it is ok to progress to your normal diet.  A half-sandwich or bowl of soup is an example of a good first meal.  Heavy or fried foods are harder to digest and may make you feel nauseous or bloated.  Likewise meals heavy in dairy and vegetables can cause extra gas to form and this can also increase the bloating.  Drink plenty of fluids but you should avoid alcoholic beverages for 24 hours.  ACTIVITY: Your care partner should take you home directly after the procedure.  You should plan to take it easy, moving slowly for the rest of the day.  You can resume normal activity the day after the procedure however you should NOT DRIVE or use heavy machinery for 24 hours (because of the sedation medicines used during the test).    SYMPTOMS TO REPORT  IMMEDIATELY: A gastroenterologist can be reached at any hour.  During normal business hours, 8:30 AM to 5:00 PM Monday through Friday, call [phone].  After hours and on weekends, please call the GI answering service at [phone] who will take a message and have the physician on call contact you.   Following lower endoscopy (colonoscopy or flexible sigmoidoscopy):  Excessive amounts of blood in the stool  Significant tenderness or worsening of abdominal pains  Swelling of the abdomen that is new, acute  Fever of 100F or higher  Black, tarry-looking stools  FOLLOW UP: If any biopsies were taken you will be contacted by phone or by letter within the next 1-3 weeks.  Call your gastroenterologist if you have not heard about the biopsies in 3 weeks.  Our staff will call the home number listed on your records the next business day following your procedure to check on you and address any questions or concerns that you may have at that time regarding the information given to you following your procedure. This is a courtesy call and so if there is no answer at the home number and we have not heard from you through the emergency physician on call, we will assume that you have returned to your regular daily activities without incident.  SIGNATURES/CONFIDENTIALITY: You and/or your care partner have signed paperwork which will be entered into your electronic medical record.  These signatures attest to the fact that that the  information above on your After Visit Summary has been reviewed and is understood.  Full responsibility of the confidentiality of this discharge information lies with you and/or your care-partner.

## 2013-10-20 NOTE — Op Note (Signed)
[facility] [address] [place], [address]   COLONOSCOPY PROCEDURE REPORT  PATIENT: [name], [name]  MR#: [id number] BIRTHDATE: 02-Oct-1951 , 62  yrs. old GENDER: Male ENDOSCOPIST: Louis Meckel, MD REFERRED BY: PROCEDURE DATE:  10/20/2013 PROCEDURE:   Colonoscopy, diagnostic First Screening Colonoscopy - Avg.  risk and is 50 yrs.  old or older - No.  Prior Negative Screening - Now for repeat screening. 10 or more years since last screening  History of Adenoma - Now for follow-up colonoscopy & has been > or = to 3 yrs.  N/A  Polyps Removed Today? No.  Recommend repeat exam, <10 yrs? No. ASA CLASS:   Class III INDICATIONS:Average risk patient for colon cancer. MEDICATIONS: MAC sedation, administered by CRNA and Propofol (Diprivan) 240 mg IV  DESCRIPTION OF PROCEDURE:   After the risks benefits and alternatives of the procedure were thoroughly explained, informed consent was obtained.  A digital rectal exam revealed no abnormalities of the rectum.   The     endoscope was introduced through the anus and advanced to the cecum, which was identified by both the appendix and ileocecal valve. No adverse events experienced.   The quality of the prep was Suprep good  The instrument was then slowly withdrawn as the colon was fully examined.      COLON FINDINGS: Moderate diverticulosis was noted in the sigmoid colon and descending colon.   The colon was otherwise normal. There was no diverticulosis, inflammation, polyps or cancers unless previously stated.  Retroflexed views revealed no abnormalities. The time to cecum=3 minutes 16 seconds.  Withdrawal time=7 minutes 27 seconds.  The scope was withdrawn and the procedure completed. COMPLICATIONS: There were no complications.  ENDOSCOPIC IMPRESSION: 1.   Moderate diverticulosis was noted in the sigmoid colon and descending colon 2.   The colon was otherwise normal  RECOMMENDATIONS: Continue current colorectal  screening recommendations for "routine risk" patients with a repeat colonoscopy in 10 years.   eSigned:  Louis Meckel, MD 10/20/2013 12:02 PM   cc: Michele Mcalpine, MD and Zelphia Cairo MD   PATIENT NAME:  Juan [name], Juan [name] MR#: [id number]

## 2013-10-21 ENCOUNTER — Telehealth: Payer: Self-pay

## 2013-10-21 NOTE — Telephone Encounter (Signed)
Called [phone] and received the answering machine.  Person ID said "this is Juan [name] please leave a message".  Left voice mail to call us back if he has any questions or concerns. maw

## 2013-12-14 ENCOUNTER — Encounter: Payer: Self-pay | Admitting: Internal Medicine

## 2013-12-14 ENCOUNTER — Other Ambulatory Visit (INDEPENDENT_AMBULATORY_CARE_PROVIDER_SITE_OTHER): Payer: BC Managed Care – PPO

## 2013-12-14 ENCOUNTER — Encounter (HOSPITAL_COMMUNITY)
Admission: EM | Disposition: A | Discharge status: Home / Self Care | Payer: Self-pay | Source: Home / Self Care | Attending: Emergency Medicine

## 2013-12-14 ENCOUNTER — Observation Stay (HOSPITAL_COMMUNITY)
Admission: EM | Admit: 2013-12-14 | Discharge: 2013-12-15 | Disposition: A | Discharge status: Home / Self Care | Payer: BC Managed Care – PPO | Attending: Cardiovascular Disease | Admitting: Cardiovascular Disease

## 2013-12-14 ENCOUNTER — Encounter: Payer: Self-pay | Admitting: *Deleted

## 2013-12-14 ENCOUNTER — Encounter (HOSPITAL_COMMUNITY): Payer: Self-pay | Admitting: Emergency Medicine

## 2013-12-14 ENCOUNTER — Ambulatory Visit (INDEPENDENT_AMBULATORY_CARE_PROVIDER_SITE_OTHER): Payer: BC Managed Care – PPO | Admitting: *Deleted

## 2013-12-14 ENCOUNTER — Encounter: Payer: Self-pay | Admitting: Cardiology

## 2013-12-14 ENCOUNTER — Telehealth (HOSPITAL_COMMUNITY): Payer: Self-pay

## 2013-12-14 ENCOUNTER — Emergency Department (HOSPITAL_COMMUNITY): Payer: BC Managed Care – PPO

## 2013-12-14 VITALS — BP 110/86 | HR 82 | Wt 194.0 lb

## 2013-12-14 DIAGNOSIS — I252 Old myocardial infarction: Secondary | ICD-10-CM | POA: Insufficient documentation

## 2013-12-14 DIAGNOSIS — R200 Anesthesia of skin: Secondary | ICD-10-CM | POA: Diagnosis present

## 2013-12-14 DIAGNOSIS — R209 Unspecified disturbances of skin sensation: Secondary | ICD-10-CM

## 2013-12-14 DIAGNOSIS — I2510 Atherosclerotic heart disease of native coronary artery without angina pectoris: Principal | ICD-10-CM

## 2013-12-14 DIAGNOSIS — I10 Essential (primary) hypertension: Secondary | ICD-10-CM

## 2013-12-14 DIAGNOSIS — R202 Paresthesia of skin: Secondary | ICD-10-CM

## 2013-12-14 DIAGNOSIS — Z79899 Other long term (current) drug therapy: Secondary | ICD-10-CM

## 2013-12-14 DIAGNOSIS — E7800 Pure hypercholesterolemia, unspecified: Secondary | ICD-10-CM

## 2013-12-14 DIAGNOSIS — I951 Orthostatic hypotension: Secondary | ICD-10-CM

## 2013-12-14 DIAGNOSIS — I209 Angina pectoris, unspecified: Secondary | ICD-10-CM | POA: Insufficient documentation

## 2013-12-14 DIAGNOSIS — E785 Hyperlipidemia, unspecified: Secondary | ICD-10-CM

## 2013-12-14 DIAGNOSIS — Z85820 Personal history of malignant melanoma of skin: Secondary | ICD-10-CM | POA: Insufficient documentation

## 2013-12-14 HISTORY — PX: LEFT HEART CATHETERIZATION WITH CORONARY ANGIOGRAM: SHX5451

## 2013-12-14 LAB — CBC
HCT: 47.1 % (ref 39.0–52.0)
Hemoglobin: 15.5 g/dL (ref 13.0–17.0)
MCH: 27.8 pg (ref 26.0–34.0)
MCHC: 32.9 g/dL (ref 30.0–36.0)
MCV: 84.6 fL (ref 78.0–100.0)
Platelets: 316 10*3/uL (ref 150–400)
RBC: 5.57 MIL/uL (ref 4.22–5.81)
RDW: 14.2 % (ref 11.5–15.5)
WBC: 9.3 10*3/uL (ref 4.0–10.5)

## 2013-12-14 LAB — BASIC METABOLIC PANEL
BUN: 9 mg/dL (ref 6–23)
CHLORIDE: 104 meq/L (ref 96–112)
CO2: 29 meq/L (ref 19–32)
CREATININE: 0.96 mg/dL (ref 0.50–1.35)
Calcium: 9.6 mg/dL (ref 8.4–10.5)
GFR calc Af Amer: >90 mL/min (ref >90)
GFR calc non Af Amer: 86 mL/min — ABNORMAL LOW (ref >90)
Glucose, Bld: 110 mg/dL — ABNORMAL HIGH (ref 70–99)
POTASSIUM: 4.5 meq/L (ref 3.7–5.3)
SODIUM: 143 meq/L (ref 137–147)

## 2013-12-14 LAB — HEPATIC FUNCTION PANEL
ALBUMIN: 3.9 g/dL (ref 3.5–5.2)
ALT: 25 U/L (ref 0–53)
AST: 24 U/L (ref 0–37)
Alkaline Phosphatase: 73 U/L (ref 39–117)
BILIRUBIN DIRECT: 0 mg/dL (ref 0.0–0.3)
TOTAL PROTEIN: 7.3 g/dL (ref 6.0–8.3)
Total Bilirubin: 0.9 mg/dL (ref 0.3–1.2)

## 2013-12-14 LAB — POCT I-STAT TROPONIN I: TROPONIN I, POC: 0 ng/mL (ref 0.00–0.08)

## 2013-12-14 LAB — LIPID PANEL
CHOLESTEROL: 133 mg/dL (ref 0–200)
HDL: 38.3 mg/dL — ABNORMAL LOW (ref >39.00)
LDL Cholesterol: 61 mg/dL (ref 0–99)
Total CHOL/HDL Ratio: 3
Triglycerides: 168 mg/dL — ABNORMAL HIGH (ref 0.0–149.0)
VLDL: 33.6 mg/dL (ref 0.0–40.0)

## 2013-12-14 LAB — PROTIME-INR
INR: 0.95 (ref 0.00–1.49)
PROTHROMBIN TIME: 12.5 s (ref 11.6–15.2)

## 2013-12-14 SURGERY — LEFT HEART CATHETERIZATION WITH CORONARY ANGIOGRAM
Anesthesia: LOCAL

## 2013-12-14 MED ORDER — OXYCODONE-ACETAMINOPHEN 5-325 MG PO TABS: 1.0000 | ORAL_TABLET | ORAL | Status: DC | PRN

## 2013-12-14 MED ORDER — HEPARIN (PORCINE) IN NACL 2-0.9 UNIT/ML-% IJ SOLN
INTRAMUSCULAR | Status: AC
  Filled 2013-12-14: qty 1000

## 2013-12-14 MED ORDER — NITROGLYCERIN 0.2 MG/ML ON CALL CATH LAB
INTRAVENOUS | Status: AC
  Filled 2013-12-14: qty 1

## 2013-12-14 MED ORDER — MIDAZOLAM HCL 2 MG/2ML IJ SOLN
INTRAMUSCULAR | Status: AC
  Filled 2013-12-14: qty 2

## 2013-12-14 MED ORDER — RAMIPRIL 2.5 MG PO CAPS
2.5000 mg | ORAL_CAPSULE | Freq: Every day | ORAL | Status: DC
  Administered 2013-12-14 – 2013-12-15 (×2): 2.5 mg via ORAL
  Filled 2013-12-14 (×2): qty 1

## 2013-12-14 MED ORDER — ASPIRIN 81 MG PO CHEW: 324.0000 mg | CHEWABLE_TABLET | ORAL | Status: DC

## 2013-12-14 MED ORDER — ACETAMINOPHEN 325 MG PO TABS: 650.0000 mg | ORAL_TABLET | ORAL | Status: DC | PRN

## 2013-12-14 MED ORDER — ONDANSETRON HCL 4 MG/2ML IJ SOLN: 4.0000 mg | Freq: Four times a day (QID) | INTRAMUSCULAR | Status: DC | PRN

## 2013-12-14 MED ORDER — CLOPIDOGREL BISULFATE 300 MG PO TABS
600.0000 mg | ORAL_TABLET | ORAL | Status: AC
  Administered 2013-12-14: 600 mg via ORAL
  Filled 2013-12-14: qty 2

## 2013-12-14 MED ORDER — SODIUM CHLORIDE 0.9 % IJ SOLN: 3.0000 mL | INTRAMUSCULAR | Status: DC | PRN

## 2013-12-14 MED ORDER — CLOPIDOGREL BISULFATE 75 MG PO TABS: 600.0000 mg | ORAL_TABLET | Freq: Once | ORAL | Status: DC

## 2013-12-14 MED ORDER — VERAPAMIL HCL 2.5 MG/ML IV SOLN
INTRAVENOUS | Status: AC
  Filled 2013-12-14: qty 2

## 2013-12-14 MED ORDER — SODIUM CHLORIDE 0.9 % IV SOLN: 250.0000 mL | INTRAVENOUS | Status: DC | PRN

## 2013-12-14 MED ORDER — ASPIRIN 300 MG RE SUPP
300.0000 mg | RECTAL | Status: DC
  Filled 2013-12-14: qty 1

## 2013-12-14 MED ORDER — ATORVASTATIN CALCIUM 20 MG PO TABS
20.0000 mg | ORAL_TABLET | Freq: Every day | ORAL | Status: DC
  Administered 2013-12-14 – 2013-12-15 (×2): 20 mg via ORAL
  Filled 2013-12-14 (×2): qty 1

## 2013-12-14 MED ORDER — ASPIRIN EC 325 MG PO TBEC
325.0000 mg | DELAYED_RELEASE_TABLET | Freq: Every day | ORAL | Status: DC
  Filled 2013-12-14: qty 1

## 2013-12-14 MED ORDER — NITROGLYCERIN 0.4 MG SL SUBL: 0.4000 mg | SUBLINGUAL_TABLET | SUBLINGUAL | Status: DC | PRN

## 2013-12-14 MED ORDER — SODIUM CHLORIDE 0.9 % IJ SOLN: 3.0000 mL | Freq: Two times a day (BID) | INTRAMUSCULAR | Status: DC

## 2013-12-14 MED ORDER — HEPARIN SODIUM (PORCINE) 1000 UNIT/ML IJ SOLN
INTRAMUSCULAR | Status: AC
  Filled 2013-12-14: qty 1

## 2013-12-14 MED ORDER — ASPIRIN 81 MG PO CHEW
324.0000 mg | CHEWABLE_TABLET | Freq: Once | ORAL | Status: AC
  Administered 2013-12-14: 324 mg via ORAL
  Filled 2013-12-14: qty 4

## 2013-12-14 MED ORDER — SODIUM CHLORIDE 0.9 % IV SOLN: INTRAVENOUS | Status: AC

## 2013-12-14 MED ORDER — FENTANYL CITRATE 0.05 MG/ML IJ SOLN
INTRAMUSCULAR | Status: AC
  Filled 2013-12-14: qty 2

## 2013-12-14 MED ORDER — LIDOCAINE HCL (PF) 1 % IJ SOLN
INTRAMUSCULAR | Status: AC
  Filled 2013-12-14: qty 30

## 2013-12-14 MED ORDER — ASPIRIN EC 81 MG PO TBEC
81.0000 mg | DELAYED_RELEASE_TABLET | Freq: Every day | ORAL | Status: DC
  Administered 2013-12-15: 81 mg via ORAL
  Filled 2013-12-14: qty 1

## 2013-12-14 NOTE — ED Notes (Signed)
Pt has cardiac history and has same symptoms when he last had stent placed.  Pt states since the weekend has been having tingling different feeling in left arm that is intermittent.  No chest pain.  Pt has never had chest pain.  Pt reports with exertion had some lightheadedness.  Pt only has funny feeling tingling/numb in left pinkie

## 2013-12-14 NOTE — Progress Notes (Signed)
Patient came in for scheduled labs today. Patient told front desk he was having symptoms like when he had stent previously. Spoke with patient and symptoms started about a week ago. Refers to left arm numbness/feeling like asleep, fingers cold/numb to being what he had with stents in 2012. Denies any chest pains, shortness of breath, or fever but did have some lightheadedness with pushing wheel borrow on Saturday. Blood pressure 110/86, heart rate 82, and wt 194. Patient states he just has a funny feeling all over.  Medications reviewed with patient. Discussed with DOD Dr Caryl Comes and he recommended following walk in chest pain protocol and have patient go to [facility] ED for evaluation since symptoms like that of previous stenting. EKG signed by Dr Caryl Comes.  Advised patient, suggested going by EMS but patient chose to drive himself.

## 2013-12-14 NOTE — ED Notes (Signed)
Pt waiting for cardiac catheterization. Consent has been done. Denies any pain. Is resting comfortably.

## 2013-12-14 NOTE — ED Notes (Signed)
Pt states pain radiates from Left mid bicep to the pinky finger.

## 2013-12-14 NOTE — ED Provider Notes (Signed)
Medical screening examination/treatment/procedure(s) were performed by non-physician practitioner and as supervising physician I was immediately available for consultation/collaboration.  EKG Interpretation   None        Jasper Riling. Alvino Chapel, MD 12/14/13 [phone]

## 2013-12-14 NOTE — H&P (Signed)
Physician History and Physical         Patient ID: Juan [name] MRN: [id number] DOB/AGE: Sep 13, 1951 63 y.o. Admit date: 12/14/2013  Primary Care Physician: Noralee Space, MD Primary Cardiologist:  Hochrein  Active Problems:   * No active hospital problems. *   HPI:   63 yo patient with known CAD.  IMI in in 2004 with DES x 2  Had distal RCA stenosis in 2012 with repeat intervention Of note he does not get typical chest pain.  Usually has pain or abnormal sensation in left arm Saw [facility] for routine f/u 09/18/13 and niaspan stopped with increase in statin Was at office having labs and complained of increasing arm pain the last week.  Advised by DOD Dr Caryl Comes to go to ER.  Has had chronic left shoulder problems.  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 From golf with steroid injections  Had melanoma resction as teenager in left pectoral area going toward axilla.  Says uncomfortable sensation in left arm started last Saturday Has been progressive not related to motion or position.  Similar to previous sensation/pain before two cornary interventions. Has never taken nitro.  In ER ECG no acute changes and POC troponin negative.  Compliant with meds. No dyspnea palpitations, recent trauma or other muscular pains.    Review of systems complete and found to be negative unless listed above   Past Medical History  Diagnosis Date  . Diverticulosis of colon   . Myocardial infarction     RCA occlusion with 2 drug-eluting stents 2004, 90% stenosis distal to the stents in the same artery treated with drug-eluting stenting February 2012  . Melanoma   . Dyslipidemia   . Coronary artery disease   . Hypertension   . Hyperlipidemia     Family History  Problem Relation Age of Onset  . Heart attack Father 4    died  . Stroke Mother   . Hypertension Mother   . Hypertension Brother   . Colon cancer Neg Hx   . Pancreatic cancer Neg Hx   . Stomach cancer Neg Hx   . Esophageal cancer Neg Hx   . Rectal  cancer Neg Hx     History   Social History  . Marital Status: Married    Spouse Name: N/A    Number of Children: 1  . Years of Education: N/A   Occupational History  . full time    Social History Main Topics  . Smoking status: Never Smoker   . Smokeless tobacco: Never Used  . Alcohol Use: 0.6 oz/week    1 Cans of beer per week  . Drug Use: No  . Sexual Activity: Not on file   Other Topics Concern  . Not on file   Social History Narrative  . No narrative on file    Past Surgical History  Procedure Laterality Date  . Ptca    . Colonoscopy    . Melanoma excision    . Wisdom teeth removal        (Not in a hospital admission)  Physical Exam:  BP 143/76  Pulse 95  Temp(Src) 97.6 F (36.4 C) (Oral)  Resp 18  Wt 197 lb 2 oz (89.415 kg)  SpO2 100%    Affect appropriate Healthy:  appears stated age HEENT: normal Neck supple with no adenopathy JVP normal no bruits no thyromegaly Lungs clear with no wheezing and good diaphragmatic motion Heart:  S1/S2 no murmur, no rub, gallop or click PMI  normal Abdomen: benighn, BS positve, no tenderness, no AAA no bruit.  No HSM or HJR Distal pulses intact with no bruits No edema Neuro non-focal Skin warm and dry No muscular weakness Small supraclavicular lymph note  Good pulses and hand strength in left arm Previous surgery left pectoral area with no axillary masses   Labs:   Lab Results  Component Value Date   WBC 9.3 12/14/2013   HGB 15.5 12/14/2013   HCT 47.1 12/14/2013   MCV 84.6 12/14/2013   PLT 316 12/14/2013    Recent Labs Lab 12/14/13 1118  NA 143  K 4.5  CL 104  CO2 29  BUN 9  CREATININE 0.96  CALCIUM 9.6  GLUCOSE 110*   Lab Results  Component Value Date   CKTOTAL 87 01/17/2011   CKMB 1.6 01/17/2011   TROPONINI  Value: 0.05        NO INDICATION OF MYOCARDIAL INJURY. 01/17/2011    Lab Results  Component Value Date   CHOL 132 08/04/2013   CHOL 145 07/11/2012   CHOL 124 05/31/2011   Lab Results    Component Value Date   HDL 45.10 08/04/2013   HDL 50.30 07/11/2012   HDL 51.60 05/31/2011   Lab Results  Component Value Date   LDLCALC 72 08/04/2013   LDLCALC 71 07/11/2012   LDLCALC 64 05/31/2011   Lab Results  Component Value Date   TRIG 73.0 08/04/2013   TRIG 117.0 07/11/2012   TRIG 44.0 05/31/2011   Lab Results  Component Value Date   CHOLHDL 3 08/04/2013   CHOLHDL 3 07/11/2012   CHOLHDL 2 05/31/2011   No results found for this basename: LDLDIRECT      Radiology: Dg Chest 2 View  12/14/2013   CLINICAL DATA:  Left arm pain.  EXAM: CHEST  2 VIEW  COMPARISON:  08/06/2013  FINDINGS: The lungs are clear without focal infiltrate, edema, pneumothorax or pleural effusion. Trace scarring at the left base is stable. The cardiopericardial silhouette is within normal limits for size. Imaged bony structures of the thorax are intact.  IMPRESSION: Stable.  No acute findings.   Electronically Signed   By: Misty Stanley M.D.   On: 12/14/2013 11:18    EKG:  NSR normal no acute ischemic changes   Current facility-administered medications:aspirin chewable tablet 324 mg, 324 mg, Oral, Once, Hyman Bible, PA-C Current outpatient prescriptions:aspirin 81 MG tablet, Take 81 mg by mouth daily.  , Disp: , Rfl: ;  atorvastatin (LIPITOR) 20 MG tablet, Take 1 tablet (20 mg total) by mouth daily., Disp: 30 tablet, Rfl: 11;  nitroGLYCERIN (NITROSTAT) 0.4 MG SL tablet, Place 1 tablet (0.4 mg total) under the tongue every 5 (five) minutes as needed., Disp: 25 tablet, Rfl: 6;  ramipril (ALTACE) 2.5 MG capsule, Take 2.5 mg by mouth daily., Disp: , Rfl:    ASSESSMENT AND PLAN:   Arm Pain:  Most concerning is that this is the same pain he had before his two RCA interventions.  No other etiology apparent although he has history of left shoulder problems and melanoma Resection on that side.  Small supraclavicular lymph node palpable  Given that this sensation was his previous anginal equivalent favor cath today  Patient and  wife in agreement.  Keep NPO Orders written and Dr Tamala Julian should be able to do today Chol:  LDL 72 in September Niacin stopped continue statin had lipid profile in office today HTN:  Continue Altace     Signed: Collier Salina Nishan1/11/2014, 12:48  PM

## 2013-12-14 NOTE — CV Procedure (Signed)
     Left Heart Catheterization with Coronary Angiography Report  Juan [name]  63 y.o.  male October 07, 1951  Procedure Date:12/14/2013 Referring Physician: Jenkins Rouge, MD Primary Cardiologist: Percival Spanish, MD  INDICATIONS: History of CAD with prior RCA stents x 3. Now with vague arm pain similar to prior presentation 2012.  PROCEDURE: 1. Left heart catheterization; 2. Coronary angiography; 3. Left ventriculography  CONSENT:  The risks, benefits, and details of the procedure were explained in detail to the patient. Risks including death, stroke, heart attack, kidney injury, allergy, limb ischemia, bleeding and radiation injury were discussed.  The patient verbalized understanding and wanted to proceed.  Informed written consent was obtained.  PROCEDURE TECHNIQUE:  After Xylocaine anesthesia and multiple stick, we made two attempts to advance a guide wire into the right radial artery after obtaining pulsatile blood return.Each attempt failed.. We converted to the femoral approach. A 5 F sheath was placed in the right femoral artery following a single anterior wall stick..  Coronary angiography was done using a 5Fr JL3.5 and JR4 catheter.  Left ventriculography was done using the JR4 catheter and hand injection.   Following the procedure, angio-seal was use for hemostasis.  CONTRAST:  Total of 80 cc.  HEMODYNAMICS:  Aortic pressure 101/63 mmHg; LV pressure  104/3 mmHg; LVEDP 3 mmHg  ANGIOGRAPHIC DATA:   The left main coronary artery is widely patent.  The left anterior descending artery is widely patent..  The left circumflex artery is patent. The first and second OM branches contain proximal to mid 40 to 50% narrowing , unchanged from the prior study in 2012..  The right coronary artery is patent and the previously placed stents in the proximal to mid vessel contain 40-50% distal ISR. The stents overlap. No high grade obstruction is noted.   LEFT VENTRICULOGRAM:  Left ventricular  angiogram was done in the 30 RAO projection and revealed normal function with EF 60%.   IMPRESSIONS:  1. Patent right coronary stents with < 50% ISR in distal 15 mm of the stented region.  2. Widely patent LAD and circumflex. The OM 1 and OM 2 contain 50% mid stenoses.  3. Normal LV function.   RECOMMENDATION:   No further cardiac evaluation needed. The left arm complaint is probably non cardiac.Marland Kitchen

## 2013-12-14 NOTE — Interval H&P Note (Signed)
Cath Lab Visit (complete for each Cath Lab visit)  Clinical Evaluation Leading to the Procedure:   ACS: no  Non-ACS:    Anginal Classification: CCS II  Anti-ischemic medical therapy: Minimal Therapy (1 class of medications)  Non-Invasive Test Results: No non-invasive testing performed  Prior CABG: No previous CABG      History and Physical Interval Note:  12/14/2013 5:04 PM  Juan [name]  has presented today for surgery, with the diagnosis of Chest pain  The various methods of treatment have been discussed with the patient and family. After consideration of risks, benefits and other options for treatment, the patient has consented to  Procedure(s): LEFT HEART CATHETERIZATION WITH CORONARY ANGIOGRAM (N/A) as a surgical intervention .  The patient's history has been reviewed, patient examined, no change in status, stable for surgery.  I have reviewed the patient's chart and labs.  Questions were answered to the patient's satisfaction.     Sinclair Grooms

## 2013-12-14 NOTE — ED Provider Notes (Signed)
CSN: [id number]     Arrival date & time 12/14/13  1020 History   First MD Initiated Contact with Patient 12/14/13 1100     Chief Complaint  Patient presents with  . Left arm tingling    (Consider location/radiation/quality/duration/timing/severity/associated sxs/prior Treatment) HPI Comments: Patient with a history of CAD s/p stents presents today with a chief complaint of intermittent numbness and tingling of his left arm.  He reports that this has been occurring intermittently over the past week.  He states that each episode typically lasts 30-60 minutes and then resolves.  He reports that the numbness came on over the weekend while he was outside doing yard work.  He denies any chest pain or SOB associated with the pain.  Denies numbness or tingling of his right arm.  Denies headache, vision changes, facial droop, weakness, difficulty swallowing, difficulty speaking, or ataxia.  He reports that his symptoms feel similar to when he has needed to have stents placed in the past due to blockage of coronary arteries.  He reports that he did not have chest pain with when he required stent placement.  He was seen in the office of Cardiology just prior to arrival for routine labs and mentioned the numbness of the left arm to the staff.  He was then told to come to the ED.  He was never actually seen by the Cardiologist at this visit.    The history is provided by the patient.    Past Medical History  Diagnosis Date  . Diverticulosis of colon   . Myocardial infarction     RCA occlusion with 2 drug-eluting stents 2004, 90% stenosis distal to the stents in the same artery treated with drug-eluting stenting February 2012  . Melanoma   . Dyslipidemia   . Coronary artery disease   . Hypertension   . Hyperlipidemia    Past Surgical History  Procedure Laterality Date  . Ptca    . Colonoscopy    . Melanoma excision    . Wisdom teeth removal     Family History  Problem Relation Age of Onset  .  Heart attack Father 92    died  . Stroke Mother   . Hypertension Mother   . Hypertension Brother   . Colon cancer Neg Hx   . Pancreatic cancer Neg Hx   . Stomach cancer Neg Hx   . Esophageal cancer Neg Hx   . Rectal cancer Neg Hx    History  Substance Use Topics  . Smoking status: Never Smoker   . Smokeless tobacco: Never Used  . Alcohol Use: 0.6 oz/week    1 Cans of beer per week    Review of Systems  All other systems reviewed and are negative.    Allergies  Review of patient's allergies indicates no known allergies.  Home Medications   Current Outpatient Rx  Name  Route  Sig  Dispense  Refill  . aspirin 81 MG tablet   Oral   Take 81 mg by mouth daily.           Marland Kitchen atorvastatin (LIPITOR) 20 MG tablet   Oral   Take 1 tablet (20 mg total) by mouth daily.   30 tablet   11   . nitroGLYCERIN (NITROSTAT) 0.4 MG SL tablet   Sublingual   Place 1 tablet (0.4 mg total) under the tongue every 5 (five) minutes as needed.   25 tablet   6   . ramipril (ALTACE) 2.5 MG capsule  Oral   Take 2.5 mg by mouth daily.          BP 143/76  Pulse 95  Temp(Src) 97.6 F (36.4 C) (Oral)  Resp 18  Wt 197 lb 2 oz (89.415 kg)  SpO2 100% Physical Exam  Nursing note and vitals reviewed. Constitutional: He appears well-developed and well-nourished.  HENT:  Head: Normocephalic and atraumatic.  Mouth/Throat: Oropharynx is clear and moist.  Eyes: EOM are normal. Pupils are equal, round, and reactive to light.  Neck: Normal range of motion. Neck supple. No spinous process tenderness present.  Cardiovascular: Normal rate, regular rhythm, normal heart sounds and intact distal pulses.   Pulmonary/Chest: Effort normal and breath sounds normal. No respiratory distress. He has no wheezes. He has no rales. He exhibits no tenderness.  Abdominal: Soft. There is no tenderness.  Musculoskeletal: Normal range of motion.       Left shoulder: He exhibits normal range of motion, no  tenderness, no deformity and normal pulse.       Cervical back: He exhibits normal range of motion, no tenderness, no bony tenderness, no swelling, no edema and no deformity.  Neurological: He is alert. He has normal strength. No cranial nerve deficit or sensory deficit. Coordination normal.  Skin: Skin is warm and dry.  Psychiatric: He has a normal mood and affect.    ED Course  Procedures (including critical care time) Labs Review Labs Reviewed  BASIC METABOLIC PANEL - Abnormal; Notable for the following:    Glucose, Bld 110 (*)    GFR calc non Af Amer 86 (*)    All other components within normal limits  CBC   Imaging Review Dg Chest 2 View  12/14/2013   CLINICAL DATA:  Left arm pain.  EXAM: CHEST  2 VIEW  COMPARISON:  08/06/2013  FINDINGS: The lungs are clear without focal infiltrate, edema, pneumothorax or pleural effusion. Trace scarring at the left base is stable. The cardiopericardial silhouette is within normal limits for size. Imaged bony structures of the thorax are intact.  IMPRESSION: Stable.  No acute findings.   Electronically Signed   By: Misty Stanley M.D.   On: 12/14/2013 11:18    EKG Interpretation   None      Date: 12/14/2013  Rate: 97  Rhythm: normal sinus rhythm  QRS Axis: normal  Intervals: normal  ST/T Wave abnormalities: nonspecific T wave changes  Conduction Disutrbances:none  Narrative Interpretation:   Old EKG Reviewed: unchanged   12:34 PM Cardiology currently evaluating the patient. MDM  No diagnosis found. Patient presents with a chief complaint of intermittent numbness of the left arm that has been occurring over the past week.  Patient has a history of CAD and has had stents placed in 2004 and 2012.  He is currently followed by Cardiology.  He reports that the numbness that he is feeling is similar to what he felt when he had the blockages and stents placed in the past.  EKG unremarkable  Labs unremarkable.  Initial troponin is negative.   Cardiology consulted while patient in the ED.  Cardiology evaluated the patient and agreed to admit the patient.    Hyman Bible, PA-C 12/14/13 1537

## 2013-12-15 DIAGNOSIS — I951 Orthostatic hypotension: Secondary | ICD-10-CM

## 2013-12-15 MED ORDER — CLOPIDOGREL BISULFATE 75 MG PO TABS: 75.0000 mg | ORAL_TABLET | Freq: Every day | ORAL | Status: DC

## 2013-12-15 MED ORDER — SODIUM CHLORIDE 0.9 % IV SOLN
INTRAVENOUS | Status: DC
  Administered 2013-12-15: 75 mL/h via INTRAVENOUS

## 2013-12-15 NOTE — Progress Notes (Signed)
Patient tolerated bed rest post cath well. Ambulated in room without complications to groin site. However his heart rate changes at rest he is 60-70 ambulating he got to 141 heart rate walking just to bathroom. He denies feeling any different. I will continue to monitor.

## 2013-12-15 NOTE — Discharge Summary (Signed)
CARDIOLOGY DISCHARGE SUMMARY   Patient ID: Juan [name] MRN: [id number] DOB/AGE: [birth year] 63 y.o.  Admit date: 12/14/2013 Discharge date: 12/15/2013  PCP: Noralee Space, MD Primary Cardiologist: [facility]  Primary Discharge Diagnosis:    Numbness and tingling in left arm   Secondary Discharge Diagnosis:    CAD (coronary artery disease)  Procedures: 1. Left heart catheterization; 2. Coronary angiography; 3. Left ventriculography  Hospital Course: Juan [name] is a 63 y.o. male with a history of CAD. He had left arm discomfort, similar to his pre-PCI symptoms. He came to the emergency room and was admitted for further evaluation and treatment.  His initial cardiac enzymes were negative for MI. However, his symptoms were concerning for his previous anginal symptoms so he was taken to the cath lab on 1/12.  Cardiac catheterization results are below. He had nonobstructive disease in his symptoms were felt to be noncardiac in nature. Medical therapy was recommended.  On 1/13, Juan [name] was seen by Dr. Acie Fredrickson. He was noted to have tachycardia with ambulation. Orthostatic vital signs were checked and showed a 20 point drop in his systolic blood pressure going from lying to standing and a 40 point increase in his heart rate. He was hydrated and this improved. He was having no further chest pain, left arm pain or shortness of breath. Labs were checked and were stable except for a minimal elevation in blood glucose. This was a nonfasting value.  Lipid profile was performed and reviewed. It showed a slightly low HDL and mildly elevated triglycerides. This can be followed as an outpatient and changes are indicated at this time.  On 1/13, Juan [name] was ambulating without chest pain or shortness of breath and considered stable for discharge, to follow up as an outpatient.  Labs:  Lab Results  Component Value Date   WBC 9.3 12/14/2013   HGB 15.5 12/14/2013   HCT 47.1 12/14/2013   MCV 84.6 12/14/2013    PLT 316 12/14/2013     Recent Labs Lab 12/14/13 0924 12/14/13 1118  NA  --  143  K  --  4.5  CL  --  104  CO2  --  29  BUN  --  9  CREATININE  --  0.96  CALCIUM  --  9.6  PROT 7.3  --   BILITOT 0.9  --   ALKPHOS 73  --   ALT 25  --   AST 24  --   GLUCOSE  --  110*   Lipid Panel     Component Value Date/Time   CHOL 133 12/14/2013 0924   TRIG 168.0* 12/14/2013 0924   HDL 38.30* 12/14/2013 0924   CHOLHDL 3 12/14/2013 0924   VLDL 33.6 12/14/2013 0924   LDLCALC 61 12/14/2013 0924    Recent Labs  12/14/13 1358  INR 0.95     Radiology: Dg Chest 2 View 12/14/2013   CLINICAL DATA:  Left arm pain.  EXAM: CHEST  2 VIEW  COMPARISON:  08/06/2013  FINDINGS: The lungs are clear without focal infiltrate, edema, pneumothorax or pleural effusion. Trace scarring at the left base is stable. The cardiopericardial silhouette is within normal limits for size. Imaged bony structures of the thorax are intact.  IMPRESSION: Stable.  No acute findings.   Electronically Signed   By: Misty Stanley M.D.   On: 12/14/2013 11:18   Cardiac Cath: 12/14/2013 ANGIOGRAPHIC DATA: The left main coronary artery is widely patent.  The left anterior descending artery  is widely patent..  The left circumflex artery is patent. The first and second OM branches contain proximal to mid 40 to 50% narrowing , unchanged from the prior study in 2012..  The right coronary artery is patent and the previously placed stents in the proximal to mid vessel contain 40-50% distal ISR. The stents overlap. No high grade obstruction is noted.  LEFT VENTRICULOGRAM: Left ventricular angiogram was done in the 30 RAO projection and revealed normal function with EF 60%.  IMPRESSIONS: 1. Patent right coronary stents with < 50% ISR in distal 15 mm of the stented region.  2. Widely patent LAD and circumflex. The OM 1 and OM 2 contain 50% mid stenoses.  3. Normal LV function.  RECOMMENDATION: No further cardiac evaluation needed. The left arm  complaint is probably non cardiac  EKG: 12/14/2013 Sinus rhythm Vent. rate 97 BPM PR interval 178 ms QRS duration 80 ms QT/QTc 338/429 ms P-R-T axes 73 58 66  FOLLOW UP PLANS AND APPOINTMENTS No Known Allergies   Medication List         aspirin EC 81 MG tablet  Take 81 mg by mouth daily.     aspirin 325 MG tablet  Take 325 mg by mouth daily as needed for mild pain.     atorvastatin 20 MG tablet  Commonly known as:  LIPITOR  Take 1 tablet (20 mg total) by mouth daily.     nitroGLYCERIN 0.4 MG SL tablet  Commonly known as:  NITROSTAT  Place 1 tablet (0.4 mg total) under the tongue every 5 (five) minutes as needed.     ramipril 2.5 MG capsule  Commonly known as:  ALTACE  Take 2.5 mg by mouth daily.        Discharge Orders   Future Appointments Provider Department Dept Phone   12/21/2013 3:30 PM Rogelia Mire, NP [place] [facility] [phone]   08/06/2014 9:00 AM Noralee Space, MD [facility] [phone]   Future Orders Complete By Expires   Diet - low sodium heart healthy  As directed    Increase activity slowly  As directed      Follow-up Information   Follow up with Murray Hodgkins, NP On 12/21/2013. (See for Dr. Percival Spanish at 3:30 pm)    Specialty:  Nurse Practitioner   Contact information:   [address] [address] [phone]       BRING ALL MEDICATIONS WITH YOU TO FOLLOW UP APPOINTMENTS  Time spent with patient to include physician time: 38 min Signed: Rosaria Ferries, PA-C 12/15/2013, 10:03 AM Co-Sign MD  Attending Note:   The patient was seen and examined.  Agree with assessment and plan as noted above.  Changes made to the above note as needed.  See note from earlier today.   Thayer Headings, Brooke Bonito., MD, [facility] 12/15/2013, 6:13 PM

## 2013-12-15 NOTE — Progress Notes (Signed)
Patient Name: Juan [name] Date of Encounter: 12/15/2013  Active Problems:   Numbness and tingling in left arm   CAD (coronary artery disease)    SUBJECTIVE: No chest pain, no arm pain, no SOB. Denies dizziness or presyncope.  OBJECTIVE Filed Vitals:   12/14/13 2000 12/14/13 2100 12/15/13 0028 12/15/13 0547  BP: 123/68 125/64 114/61 112/64  Pulse: 88  71 77  Temp: 97.9 F (36.6 C)  97.7 F (36.5 C) 98.3 F (36.8 C)  TempSrc: Oral  Oral Oral  Resp: 16  18 20   Weight:   197 lb 12 oz (89.7 kg)   SpO2: 98% 96% 97% 95%    Intake/Output Summary (Last 24 hours) at 12/15/13 5093 Last data filed at 12/15/13 0109  Gross per 24 hour  Intake    925 ml  Output    600 ml  Net    325 ml   Filed Weights   12/14/13 1022 12/15/13 0028  Weight: 197 lb 2 oz (89.415 kg) 197 lb 12 oz (89.7 kg)    PHYSICAL EXAM General: Well developed, well nourished, male in no acute distress. Head: Normocephalic, atraumatic.  Neck: Supple without bruits, JVD not elevated. Lungs:  Resp regular and unlabored, CTA. Heart: RRR, S1, S2, no S3, S4, or murmur; no rub. Abdomen: Soft, non-tender, non-distended, BS + x 4.  Extremities: No clubbing, cyanosis, edema. Right groin without ecchymosis, bruit or hematoma Neuro: Alert and oriented X 3. Moves all extremities spontaneously. Psych: Normal affect.  LABS: CBC: Recent Labs  12/14/13 1118  WBC 9.3  HGB 15.5  HCT 47.1  MCV 84.6  PLT 316   INR: Recent Labs  12/14/13 1358  INR 2.67   Basic Metabolic Panel: Recent Labs  12/14/13 1118  NA 143  K 4.5  CL 104  CO2 29  GLUCOSE 110*  BUN 9  CREATININE 0.96  CALCIUM 9.6   Liver Function Tests: Recent Labs  12/14/13 0924  AST 24  ALT 25  ALKPHOS 73  BILITOT 0.9  PROT 7.3  ALBUMIN 3.9    Recent Labs  12/14/13 1220  TROPIPOC 0.00   Fasting Lipid Panel: Recent Labs  12/14/13 0924  CHOL 133  HDL 38.30*  LDLCALC 61  TRIG 168.0*  CHOLHDL 3    TELE:   SR, ST to the  140s with ambulation   Radiology/Studies: Dg Chest 2 View 12/14/2013   CLINICAL DATA:  Left arm pain.  EXAM: CHEST  2 VIEW  COMPARISON:  08/06/2013  FINDINGS: The lungs are clear without focal infiltrate, edema, pneumothorax or pleural effusion. Trace scarring at the left base is stable. The cardiopericardial silhouette is within normal limits for size. Imaged bony structures of the thorax are intact.  IMPRESSION: Stable.  No acute findings.   Electronically Signed   By: Misty Stanley M.D.   On: 12/14/2013 11:18     Current Medications:  . aspirin  324 mg Oral NOW   Or  . aspirin  300 mg Rectal NOW  . aspirin EC  325 mg Oral Daily  . aspirin EC  81 mg Oral Daily  . atorvastatin  20 mg Oral Daily  . clopidogrel  600 mg Oral Once  . ramipril  2.5 mg Oral Daily  . sodium chloride  3 mL Intravenous Q12H  . sodium chloride  3 mL Intravenous Q12H      ASSESSMENT AND PLAN: Active Problems:   Numbness and tingling in left arm - no cardiac  cause found at cath. Hx frozen shoulder and melanoma removal. No acute abnormality on CXR, f/u with ortho or primary MD    CAD (coronary artery disease) - non-obstructive by cath this admission, continue CRF reduction.     Sinus tach - ck orthostatics, may be a little dry, encourage hydration. BP generally well-controlled, but could try low-dose Lopressor and see how tolerated  Plan: d/c later today  Signed, Rosaria Ferries , PA-C 7:12 AM 12/15/2013   Attending Note:   The patient was seen and examined.  Agree with assessment and plan as noted above.  Changes made to the above note as needed.  Pt had some tachycardia last night while getting up to the bathroom.  He was orthostatic this am.  He is likely somewhat volume depleted after his cath yesterday.   Feeling better after getting some IV NS.  Will have him ambulate in halls and will DC if he is stable.    Thayer Headings, Brooke Bonito., MD, [facility] 12/15/2013, 8:58 AM

## 2013-12-15 NOTE — Discharge Instructions (Signed)
PLEASE REMEMBER TO BRING ALL OF YOUR MEDICATIONS TO EACH OF YOUR FOLLOW-UP OFFICE VISITS. ° °PLEASE ATTEND ALL SCHEDULED FOLLOW-UP APPOINTMENTS.  ° °Activity: Increase activity slowly as tolerated. You may shower, but no soaking baths (or swimming) for 1 week. No driving for 2 days. No lifting over 5 lbs for 1 week. No sexual activity for 1 week.  ° °You May Return to Work: in 1 week (if applicable) ° °Wound Care: You may wash cath site gently with soap and water. Keep cath site clean and dry. If you notice pain, swelling, bleeding or pus at your cath site, please call [phone]. ° ° ° °Cardiac Cath Site Care °Refer to this sheet in the next few weeks. These instructions provide you with information on caring for yourself after your procedure. Your caregiver may also give you more specific instructions. Your treatment has been planned according to current medical practices, but problems sometimes occur. Call your caregiver if you have any problems or questions after your procedure. °HOME CARE INSTRUCTIONS °· You may shower 24 hours after the procedure. Remove the bandage (dressing) and gently wash the site with plain soap and water. Gently pat the site dry.  °· Do not apply powder or lotion to the site.  °· Do not sit in a bathtub, swimming pool, or whirlpool for 5 to 7 days.  °· No bending, squatting, or lifting anything over 10 pounds (4.5 kg) as directed by your caregiver.  °· Inspect the site at least twice daily.  °· Do not drive home if you are discharged the same day of the procedure. Have someone else drive you.  °· You may drive 24 hours after the procedure unless otherwise instructed by your caregiver.  °What to expect: °· Any bruising will usually fade within 1 to 2 weeks.  °· Blood that collects in the tissue (hematoma) may be painful to the touch. It should usually decrease in size and tenderness within 1 to 2 weeks.  °SEEK IMMEDIATE MEDICAL CARE IF: °· You have unusual pain at the site or down the  affected limb.  °· You have redness, warmth, swelling, or pain at the site.  °· You have drainage (other than a small amount of blood on the dressing).  °· You have chills.  °· You have a fever or persistent symptoms for more than 72 hours.  °· You have a fever and your symptoms suddenly get worse.  °· Your leg becomes pale, cool, tingly, or numb.  °· You have heavy bleeding from the site. Hold pressure on the site.  °Document Released: 12/22/2010 Document Revised: 11/08/2011 Document Reviewed: 12/22/2010 °ExitCare® Patient Information ©2012 [employer]. ° °

## 2013-12-15 NOTE — Progress Notes (Signed)
No post cath complications. Agree with plans as outlined by Nahser and Barrett.

## 2013-12-18 ENCOUNTER — Encounter: Payer: Self-pay | Admitting: *Deleted

## 2013-12-21 ENCOUNTER — Ambulatory Visit (INDEPENDENT_AMBULATORY_CARE_PROVIDER_SITE_OTHER): Payer: BC Managed Care – PPO | Admitting: Nurse Practitioner

## 2013-12-21 ENCOUNTER — Encounter: Payer: Self-pay | Admitting: Nurse Practitioner

## 2013-12-21 VITALS — BP 125/68 | HR 89 | Ht 71.0 in | Wt 197.0 lb

## 2013-12-21 DIAGNOSIS — E785 Hyperlipidemia, unspecified: Secondary | ICD-10-CM

## 2013-12-21 DIAGNOSIS — I2510 Atherosclerotic heart disease of native coronary artery without angina pectoris: Secondary | ICD-10-CM

## 2013-12-21 DIAGNOSIS — I10 Essential (primary) hypertension: Secondary | ICD-10-CM

## 2013-12-21 NOTE — Patient Instructions (Signed)
Your physician wants you to follow-up in: [place] DR. HOCHREIN You will receive a reminder letter in the mail two months in advance. If you don't receive a letter, please call our office to schedule the follow-up appointment.  Your physician recommends that you continue on your current medications as directed. Please refer to the Current Medication list given to you today.

## 2013-12-21 NOTE — Progress Notes (Signed)
Patient Name: Juan [name] Date of Encounter: 12/21/2013  Primary Care Provider:  Noralee Space, MD Primary Cardiologist:  Lenna Sciara. Hochrein, MD   Patient Profile  A 63 year old male who history CAD status post recent catheterization he presents for followup.  Problem List   Past Medical History  Diagnosis Date  . Diverticulosis of colon   . CAD (coronary artery disease)     a. RCA occlusion with 2 drug-eluting stents 2004;  b. 90% stenosis distal to the stents in the same artery treated with drug-eluting stenting February 2012;  .c 12/2013 Cath: nonobs dzs.  . Melanoma   . Dyslipidemia   . Hypertension   . Hyperlipidemia    Past Surgical History  Procedure Laterality Date  . Ptca    . Colonoscopy    . Melanoma excision    . Wisdom teeth removal      Allergies  No Known Allergies  HPI  63 year old male with prior history of coronary artery disease status post stenting as outlined above.  He also has a history of hypertension hyperlipidemia.  Last week, he developed left arm discomfort with numbness and tingling which he recognized as being similar to prior angina.  He was seen here in the office and recommendation was made for admission catheterization.  Catheterization was performed and showed nonobstructive CAD and normal LV function.  Continued medical therapy is warranted.  Post catheterization, he was found to be orthostatic her heart IV fluid hydration without adjustment in his home medications.  He was subsequently discharged home in good condition since his discharge he has done very well without recurrence of chest pain, arm pain, or dyspnea.  He denies PND, orthopnea, dizziness syncope, edema, or early satiety.  Home Medications  Prior to Admission medications   Medication Sig Start Date End Date Taking? Authorizing Provider  aspirin EC 81 MG tablet Take 81 mg by mouth daily.   Yes Historical Provider, MD  atorvastatin (LIPITOR) 20 MG tablet Take 1 tablet (20 mg  total) by mouth daily. 09/18/13  Yes Minus Breeding, MD  nitroGLYCERIN (NITROSTAT) 0.4 MG SL tablet Place 1 tablet (0.4 mg total) under the tongue every 5 (five) minutes as needed. 01/07/12  Yes Larey Dresser, MD  ramipril (ALTACE) 2.5 MG capsule Take 2.5 mg by mouth daily.   Yes Historical Provider, MD    Review of Systems  He denies chest pain, palpitations, dyspnea, pnd, orthopnea, n, v, dizziness, syncope, edema, weight gain, or early satiety.  All other systems reviewed and are otherwise negative except as noted above.  Physical Exam  Blood pressure 125/68, pulse 89, height 5\' 11"  (1.803 m), weight 197 lb (89.359 kg).  General: Pleasant, NAD Psych: Normal affect. Neuro: Alert and oriented X 3. Moves all extremities spontaneously. HEENT: Rosacea to face/nose.  Neck: Supple without bruits or JVD. Lungs:  Resp regular and unlabored, CTA. Heart: RRR no s3, s4, or murmurs. Abdomen: Soft, non-tender, non-distended, BS + x 4.  Extremities: No clubbing, cyanosis or edema. DP/PT/Radials 2+ and equal bilaterally.  Accessory Clinical Findings  ECG - regular sinus rhythm, 89, no acute ST or T changes.  Assessment & Plan  1.  Coronary artery disease: Patient status post recent catheterization which was performed secondary to left arm discomfort similar to prior angina.  Catheterization showed nonobstructive CAD with patency of previously placed stents.  He has not had recurrence of symptoms.  He remains on aspirin, statin, and p.r.n. Nitrate therapy.  2.  Hyperlipidemia: He recently  had lipids evaluated.  Cluster of 133, triglycerides 168, HDL 38.3, LDL 61.  We discussed his lipids together.  Compared to his last panel, triglycerides are up while HDL is down.  He was on niacin prior to his last panel being drawn.  This was discontinued in October given lack of efficacy data for use of niacin. I recommended that he begin to find time for 30 mins of light aerobic activity daily.  LFTs are within  normal limits. Continue his current dose of Lipitor at 20 mg daily.  3.  Hypertension: Stable on low-dose ACE inhibitor therapy.  4.  Disposition: Followup with Dr. Percival Spanish in 6 mos or sooner if necessary.  Murray Hodgkins, NP 12/21/2013, 4:18 PM

## 2014-01-05 ENCOUNTER — Other Ambulatory Visit: Payer: Self-pay | Admitting: *Deleted

## 2014-01-05 MED ORDER — RAMIPRIL 2.5 MG PO CAPS: 2.5000 mg | ORAL_CAPSULE | Freq: Every day | ORAL | Status: DC

## 2014-02-11 ENCOUNTER — Telehealth: Payer: Self-pay | Admitting: Pulmonary Disease

## 2014-02-11 ENCOUNTER — Telehealth: Payer: Self-pay | Admitting: Cardiology

## 2014-02-11 DIAGNOSIS — M79606 Pain in leg, unspecified: Secondary | ICD-10-CM

## 2014-02-11 DIAGNOSIS — R202 Paresthesia of skin: Secondary | ICD-10-CM

## 2014-02-11 DIAGNOSIS — I2510 Atherosclerotic heart disease of native coronary artery without angina pectoris: Secondary | ICD-10-CM

## 2014-02-11 DIAGNOSIS — I10 Essential (primary) hypertension: Secondary | ICD-10-CM

## 2014-02-11 MED ORDER — NITROGLYCERIN 0.4 MG SL SUBL: 0.4000 mg | SUBLINGUAL_TABLET | SUBLINGUAL | Status: DC | PRN

## 2014-02-11 NOTE — Telephone Encounter (Signed)
I spoke with pt & states that he had an episode of "?heartburn" burning in the throat accompanied by a brief episode of sweating & a "slight tingle under both arms" last night.  States he had a funny feeling around his neck earlier today & didn't feel well. He went home from work. Recent decreased appetite & has been very thirsty today.   Pt says this discomfort is  not similar to the pain he had with previous MI  He called his pcp office & pt has taken a dose of omeprazole 20mg  OTC at their recommendation. He has also taken Tums. States this seems to help.   Refilled NTG for pt. Understands how to use it. Appointment made with Estella Husk Wednesday 02/17/14.   Pt is painfree & not short of breath at this time. Horton Chin RN

## 2014-02-11 NOTE — Telephone Encounter (Signed)
I spoke with the pt and he states he has been experiencing some indigestion and heartburn over the last few days. He states he has been having a burning sensation in his throat, and burping a lot. He states last night he ate a doughnut before bed and was miserable all night. He states he does have cardiac issues but this felt different. i advised him to take omeprazole, zantac, wither OTC meds to see if this helps. I advised symptoms sound like indigestion/heartburn, but if he is concerned about his heart then to contact cardiology. Pt advised if meds do not help to call back. [place], CMA

## 2014-02-11 NOTE — Telephone Encounter (Signed)
New Prob   Pt would like to speak to someone regarding some symptoms he is having. Pt did not wish to leave details. Please call.

## 2014-02-11 NOTE — Telephone Encounter (Signed)
lmtcb Debbie Elmire Amrein RN  

## 2014-02-12 ENCOUNTER — Telehealth: Payer: Self-pay | Admitting: Cardiology

## 2014-02-12 ENCOUNTER — Ambulatory Visit (INDEPENDENT_AMBULATORY_CARE_PROVIDER_SITE_OTHER): Payer: BC Managed Care – PPO | Admitting: Internal Medicine

## 2014-02-12 ENCOUNTER — Other Ambulatory Visit (INDEPENDENT_AMBULATORY_CARE_PROVIDER_SITE_OTHER): Payer: BC Managed Care – PPO

## 2014-02-12 ENCOUNTER — Encounter: Payer: Self-pay | Admitting: Internal Medicine

## 2014-02-12 ENCOUNTER — Telehealth: Payer: Self-pay

## 2014-02-12 ENCOUNTER — Telehealth: Payer: Self-pay | Admitting: Pulmonary Disease

## 2014-02-12 VITALS — BP 136/76 | HR 60 | Ht 71.0 in | Wt 200.2 lb

## 2014-02-12 DIAGNOSIS — I10 Essential (primary) hypertension: Secondary | ICD-10-CM

## 2014-02-12 DIAGNOSIS — D72829 Elevated white blood cell count, unspecified: Secondary | ICD-10-CM

## 2014-02-12 DIAGNOSIS — K219 Gastro-esophageal reflux disease without esophagitis: Secondary | ICD-10-CM

## 2014-02-12 LAB — BASIC METABOLIC PANEL
BUN: 10 mg/dL (ref 6–23)
CALCIUM: 9.1 mg/dL (ref 8.4–10.5)
CHLORIDE: 100 meq/L (ref 96–112)
CO2: 29 mEq/L (ref 19–32)
Creatinine, Ser: 1.1 mg/dL (ref 0.4–1.5)
GFR: 72.25 mL/min (ref >60.00)
Glucose, Bld: 115 mg/dL — ABNORMAL HIGH (ref 70–99)
Potassium: 4 mEq/L (ref 3.5–5.1)
SODIUM: 134 meq/L — AB (ref 135–145)

## 2014-02-12 LAB — CBC WITH DIFFERENTIAL/PLATELET
BASOS ABS: 0.1 10*3/uL (ref 0.0–0.1)
Basophils Relative: 0.4 % (ref 0.0–3.0)
EOS ABS: 0 10*3/uL (ref 0.0–0.7)
Eosinophils Relative: 0 % (ref 0.0–5.0)
HCT: 40.9 % (ref 39.0–52.0)
HEMOGLOBIN: 13.2 g/dL (ref 13.0–17.0)
LYMPHS PCT: 9.8 % — AB (ref 12.0–46.0)
Lymphs Abs: 1.7 10*3/uL (ref 0.7–4.0)
MCHC: 32.3 g/dL (ref 30.0–36.0)
MCV: 82.3 fl (ref 78.0–100.0)
MONOS PCT: 14 % — AB (ref 3.0–12.0)
Monocytes Absolute: 2.4 10*3/uL — ABNORMAL HIGH (ref 0.1–1.0)
Neutro Abs: 13.2 10*3/uL — ABNORMAL HIGH (ref 1.4–7.7)
Neutrophils Relative %: 75.8 % (ref 43.0–77.0)
PLATELETS: 231 10*3/uL (ref 150.0–400.0)
RBC: 4.97 Mil/uL (ref 4.22–5.81)
RDW: 14.7 % — AB (ref 11.5–14.6)
WBC: 17.4 10*3/uL — ABNORMAL HIGH (ref 4.5–10.5)

## 2014-02-12 NOTE — Telephone Encounter (Signed)
Advised pt that we do not have any appointments for today. He feels he could wait until Monday to be seen but his wife and daughter are pushing for him to be seen today. Offered to let him come through the allergy lab long enough for [place] to check his BP to make his wife and daughter feel better. OV has been made for 02/15/14 at 4:15p with TP. Will make Katie aware that pt will be coming in around 12:15p to have BP checked.

## 2014-02-12 NOTE — Progress Notes (Signed)
Subjective:    Patient ID: Juan [name], male    DOB: May 08, 1951, 63 y.o.   MRN: [id number]  HPI 1 y/o WM (goes by "Juan [name]"- a good friend of Juan [name])) re-established in 2012 after a 97yr hiatus>  He has HBP, CAD- s/pMI, & Hypercholesterolemia followed regularly by Juan [name]; and Hx Melanomas x3 in the past w/ regular f/u by Juan [name] (now Juan [name]) [facility]...  ~  June 05, 2011:  Approx 2yr ROV & re-establish> SEE UPDATED PROB LIST BELOW>>  His chief complaint is a one month hx of right Juan weakness & discomfort in the Juan and wrist that started when he pulled himself up onto a truck (?etiology- ?CTS, ?tendonitis, ?other) & we discussed need to refer to Juan [name]...  ~  July 15, 2012:  29mo ROV & CPX> Juan [name] is now 7 & doing well overall- his CC is shoulder pain for which he is seeing Ortho & currently doing PT; he had a good yr w/ one prob in Feb when he presented to [facility] c/o paresthesias in his toes> neg exam & pulses seemed ok; ABIs checked w/ normal flows & normal toe pressures etc (& he was reassured)...    HBP> on MetopER25 & Ramipril2.5; BP= 110/68 & he denies CP, palpit, SOB, edema, etc...    CAD> on ASA81, Effient10; doing satis w/o CP 7 hasn't needed NTG etc; usually plays golf, walks, etc but curtailed due to shoulder pain at present...    CHOL> on Lip10 & Niasp1500mg /d; FLP looks good w/ TChol 145, TG 117, HDL 50, LDL 71    GI> Divertics> he is overdue for colonoscopy but Juan [name] didn't want him to stop the Effient for the 5-7d prior last yr; he has f/u w/ Cards 10/13 & if cleared to stop the Effient he will call Juan [name] for the needed 10+yr f/u colonoscopy...    HxMelanomas> he's had 3 & followed very closely by Juan [name] now q46mo & no recurrent lesions identified... 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 LABS 8/13:  FLP- at goals on Lip10&Niasp;   Cghems- wnl;  CBC- wnl;  TSH=3.16;  TSH=1.67   ~  August 06, 2013:  Yearly ROV & CPX>     HBP> on Ramipril2.5, he stopped MetoprololER25 on his own; BP= 136/72 & he denies CP, palpit, SOB, edema, etc; he does not want to restart BBlocker- will discuss w/ Juan [name] he says...    CAD> on ASA81 & off Effient10; doing satis w/o CP & hasn't needed NTG etc; usually plays golf, walks, etc...    CHOL> on Lip10 & Niasp1000mg /d; FLP 9/14 shows TChol 132, TG 73, HDL 45, LDL 72    GI> Divertics> he is overdue for f/u colonoscopy w/ Juan [name] & we will refer chart to GI...     HxMelanomas> he's had 3 & followed very closely by Juan [name] now q18mo & no recurrent lesions identified...    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  We reviewed prob list, meds, xrays and labs> see below for updates >> he refuses the FLU vaccine CXR 9/14 showed norm heart size, clear lungs, NAD... LABS 9/14:  FLP- at goals on Lip10+Niasp1000;  Chems- nwnl;  CBC- wnl;  TSH=3.02;  PSA=2.15...     02/12/2014 acute  ov/Juan [name] re: not feeling right x 2 weeks Chief Complaint  Patient presents with  . Acute  Visit    Juan [name] pt.  Pt c/o increased indegestion, difficulty sleeping, decrease in appetite, fatigue, increased thirst.     C/o burning throat just as had distant past started back x one week rx with tums not much better, then, tried  Omeprazole one day prior to OV  And thinks it's helping a lot. Thirsty, sleepy during the day but typically not sleeping well Good ex tolerance s throat symptoms with ex, seem more related to meals (worse with spicy foods)  No obvious day to day or daytime variabilty or assoc sob or  cough or cp or chest tightness, subjective wheeze overt sinus or hb symptoms. No unusual exp hx or h/o childhood pna/ asthma or knowledge of premature birth.  Sleeping ok without nocturnal  or early am exacerbation  of respiratory  c/o's or need for noct saba. Also denies any obvious fluctuation of  symptoms with weather or environmental changes or other aggravating or alleviating factors except as outlined above   Current Medications, Allergies, Complete Past Medical History, Past Surgical History, Family History, and Social History were reviewed in [employer] record.  ROS  The following are not active complaints unless bolded sore throat, dysphagia, dental problems, itching, sneezing,  nasal congestion or excess/ purulent secretions, ear ache,   fever, chills, sweats, unintended wt loss, pleuritic or exertional cp, hemoptysis,  orthopnea pnd or leg swelling, presyncope, palpitations, heartburn, abdominal pain, anorexia, nausea, vomiting, diarrhea  or change in bowel or urinary habits, change in stools or urine, dysuria,hematuria,  rash, arthralgias, visual complaints, headache, numbness weakness or ataxia or problems with walking or coordination,  change in mood/affect or memory.                Problem List:  HBP >>  ~  Prev controlled on TOPROL XL 25mg /d, and ALTACE 2.5mg /d...  ~  7/12: BP=120/70, tolerating meds well> denies HA, visual symptoms, CP,palpit, dizziness, syncope, dyspnea, edema, etc... ~  8/13:  BP= 110/68 & he denies CP, palpit, SOB, edema, etc... 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 ~  9/14: on Ramipril2.5, he stopped MetoprololER25 on his own; BP= 136/72 & he denies CP, palpit, SOB, edema, etc; he does not want to restart BBlocker- will discuss w/ Juan [name] he says  CAD, s/p MI & PTCA/ stents >>  ~  2004:  Hx IWMI w/ cath & RCA occlusion; s/p PTCA & 2 stents placed; this occurred in [place]... ~  9/11:  Standard Treadmill Stress Test by Juan [name] was neg> no EKG changes noted, good exercise tolerance... ~  2/12:  Hosp w/ unstable angina; cath showed 90% midRCA stenosis; s/p PTCA/ DES & placed on EFFIENT; also had 50% OM-2 lesion, otherw neg w/ EF=60-65% ~  4/12:  F/u by Juan [name]> stable, no  new complaints, no changes made... ~  10/13: he saw Juan [name]> CAD, s/p stent; stable, active, no new symptoms; Effient stopped, otherw no change...   HYPERCHOLESTEROLEMIA> Controlled on LIPITOR 20mg - taking 1/2 tab daily x yrs, and NIASPAN 1000mg - taking 1.5 tabs Qhs... ~  FLP 2/12 on Lip10+Niasp1500 showed TChol 115, TG 184, HDL 34, LDL 44 ~  FLP 6/12 on Lip10+Niasp1500 showed TChol 124, TG 44, HDL 52, LDL 64 ~  FLP 8/13 on Lip10+Niasp1500 showed TChol 145, TG 117, HDL 50, LDL 71 ~  FLP 9/14 on Lip10+Niasp1000 showed TChol 132, TG 73, HDL 45, LDL 72  DIVERTICULOSIS> I reviewed old paper chart &  last colonoscopy was 1/01 by Demetra Shiner revealing only diverticulosis & rec for f/u colon in 10 years... ~  7/12:  We will refer back to GI to set up f/u colonoscopy at his convenience==> Juan [name] did not want pt to stop Effient for the 5-7d required to do the colon. ~  8/13:  He has a follow up w/ Juan [name] for [facility] 10/13 & will inquire about stopping the effient at this point to safely get his colonoscopy... ~  9/14: he has not yet sched his f/u colonoscopy, off Effient since 10/13, we will refer chart to [place]...  BPH & Hx HEMATURIA> He was evaluated by DrPeterson 9/11 after an episode of hematuria> note reviewed, neg eval... 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 Hx of Left Shoulder Pain> He notes prev hx left shoulder discomfort w/ shot given by [facility]==> he will f/u w/ them... New onset Right Wrist/Juan Pain>  C/o some right Juan weakness & discomfort x 8mo ever since he reached & pulled himself up on a truck; we [facility] for [facility] eval...  Hx MELANOMAS> He reports hx 3 separate Melanomas: 1st on left upper chest wall at age 40;  2nd on mid back in early 2000s;  3rd on medial left thigh above the knee w/ wide excision  12/11 & he reports that "they got it all... We don't have records (nothing in [place], Geophysicist/field seismologist, or [facility])... He continues to follow up w/ [facility] & now sees DrLLomax every 73mo...  HEALTH MAINTENANCE: ~  GI:  Followed by Juan [name]> last colon 2001 & f/u due now, we will set up referral... ~  GU:  Followed by DrPeterson; neg hematuria eval 2011 & PSAs are normal... ~  Immunizations:  He refuses Flu vaccines ever since a shots yrs ago gave him the Flu;  Pneumovax will be due in 2015 at age 74;  TDAP given 7/12;  Discussed Shingles vaccine & he will check his insurance coverage...   Past Medical History  Diagnosis Date  . Diverticulosis of colon   . Myocardial infarction     RCA occlusion with 2 drug-eluting stents 2004, 90% stenosis distal to the stents in the same artery treated with drug-eluting stenting February 2012  . Melanoma   . Dyslipidemia   . Coronary artery disease   . Hypertension   . Hyperlipidemia     Past Surgical History  Procedure Laterality Date  . Ptca    . Colonoscopy      Outpatient Encounter Prescriptions as of 08/06/2013  Medication Sig Dispense Refill  . aspirin 81 MG tablet Take 81 mg by mouth daily.        Marland Kitchen atorvastatin (LIPITOR) 20 MG tablet Take 1/2 tablet once daily  15 tablet  6  . metoprolol succinate (TOPROL XL) 25 MG 24 hr tablet Take 1 tablet (25 mg total) by mouth daily.  30 tablet  6  . niacin (NIASPAN) 1000 MG CR tablet Take 1 tablet (1,000 mg total) by mouth at bedtime.  90 tablet  3  . nitroGLYCERIN (NITROSTAT) 0.4 MG SL tablet Place 1 tablet (0.4 mg total) under the tongue every 5 (five) minutes as needed.  25 tablet  6  . ramipril (ALTACE) 2.5 MG capsule Take 1 capsule (2.5 mg total) by mouth daily.  90 capsule  3   No facility-administered encounter medications on file as of 08/06/2013.     .       Objective:   Physical Exam  amb anxious wm nad   HEENT: nl dentition, turbinates, and orophanx. Nl external ear canals without  cough reflex   Wt Readings from Last 3 Encounters:  02/12/14 200 lb 3.2 oz (90.81 kg)  12/21/13 197 lb (89.359 kg)  12/15/13 197 lb 12 oz (89.7 kg)     NECK :  without JVD/Nodes/TM/ nl carotid upstrokes bilaterally   LUNGS: no acc muscle use, clear to A and P bilaterally without cough on insp or exp maneuvers   CV:  RRR  no s3 or murmur or increase in P2, no edema   ABD:  soft and nontender with nl excursion in the supine position. No bruits or organomegaly, bowel sounds nl  MS:  warm without deformities, calf tenderness, cyanosis or clubbing  SKIN: warm and dry without lesions    NEURO:  alert, approp, no deficits     Recent Labs Lab 02/12/14 1412  NA 134*  K 4.0  CL 100  CO2 29  BUN 10  CREATININE 1.1  GLUCOSE 115*    Recent Labs Lab 02/12/14 1412  HGB 13.2  HCT 40.9  WBC 17.4*  PLT 231.0     Assessment & Plan:

## 2014-02-12 NOTE — Telephone Encounter (Signed)
Ria Comment spoke with me regarding this patient coming by at 12:15pm to have BP checked; I had her speak with MW to make him aware of patient has high BP and other symptoms then he will need to be seen. MW agreed to see patient today at 1:30pm. I have added patient to his schedule as patient just doesn't feel well overall and BP was 160/74-which patient states is higher than usual for him. Pt was also informed that SN will no longer be seeing primary care as of March 03, 2014 and requested we send referral to [facility] to obtain a new PCP. Referral placed and he is aware he will be called with appt date and time with new PCP.

## 2014-02-12 NOTE — Telephone Encounter (Signed)
Pt aware of results and recs.  Nothing further needed. 

## 2014-02-12 NOTE — Telephone Encounter (Signed)
Heartburn is gone. Slept 12 hours. Feel a little bit off Just feeling funny. No appetite either. Did not eat anything. Pt does not feel like this is cardiac. States his wife wants him seen sooner than next Wednesday with our PA.  Have asked him also to call pcp about this or to be seen at an Urgent care. DOD reviewed & agrees symptoms do not appear cardiac in nature Pt understands & will either call pcp or be seen at Urgent care  Horton Chin RN

## 2014-02-12 NOTE — Patient Instructions (Addendum)
Omeprazole 20 mg Take 30-60 min before first meal of the day   GERD (REFLUX)  is an extremely common cause of respiratory symptoms, many times with no significant heartburn at all.    It can be treated with medication, but also with lifestyle changes including avoidance of late meals, excessive alcohol, smoking cessation, and avoid fatty foods, chocolate, peppermint, colas, red wine, and acidic juices such as orange juice.  NO MINT OR MENTHOL PRODUCTS SO NO COUGH DROPS  USE SUGARLESS CANDY INSTEAD (jolley ranchers or Stover's)  NO OIL BASED VITAMINS - use powdered substitutes.    If you continue have throat problems you will need to consider an alternative for altace   Please remember to go to the lab  department downstairs for your tests - we will call you with the results when they are available.

## 2014-02-12 NOTE — Addendum Note (Signed)
Addended by: Clayborne Dana C on: 02/12/2014 12:21 PM   Modules accepted: Orders

## 2014-02-12 NOTE — Telephone Encounter (Signed)
New problem   Pt need to speak to nurse concerning him being tired and fatigue and burning in his throat. Pt's wife want him to be seen today. Please call.

## 2014-02-12 NOTE — Telephone Encounter (Signed)
Message copied by Len Blalock on Fri Feb 12, 2014  4:03 PM ------      Message from: Christinia Gully B      Created: Fri Feb 12, 2014  3:30 PM       Call patient :  Studies are unremarkable x wbc a bit high so can't rule out infection but no obvious source so let us know if shaking chills, fever or new symptoms ------

## 2014-02-14 DIAGNOSIS — K219 Gastro-esophageal reflux disease without esophagitis: Secondary | ICD-10-CM | POA: Insufficient documentation

## 2014-02-14 DIAGNOSIS — D72829 Elevated white blood cell count, unspecified: Secondary | ICD-10-CM | POA: Insufficient documentation

## 2014-02-14 NOTE — Assessment & Plan Note (Signed)
Clinical dx only,  With throat burning responding to ppi  rec continue ppi ac daily and diet, f/u Dr Lenna Gilford

## 2014-02-14 NOTE — Assessment & Plan Note (Signed)
Unclear cause based on today's H and P > no source of infection noted so no need for abx at this point  rec f/u in 2 weeks to recheck, call sooner if new symptoms

## 2014-02-14 NOTE — Assessment & Plan Note (Signed)
Some of his non specific symptoms, esp the throat burning, are typical of acei though note absence of cough and improvement on ppi so for now no change in recs for Altace

## 2014-02-15 ENCOUNTER — Ambulatory Visit: Payer: BC Managed Care – PPO | Admitting: Adult Health

## 2014-02-17 ENCOUNTER — Ambulatory Visit (INDEPENDENT_AMBULATORY_CARE_PROVIDER_SITE_OTHER): Payer: BC Managed Care – PPO | Admitting: Physician Assistant

## 2014-02-17 VITALS — BP 122/58 | HR 84 | Ht 71.0 in | Wt 201.0 lb

## 2014-02-17 DIAGNOSIS — I10 Essential (primary) hypertension: Secondary | ICD-10-CM

## 2014-02-17 DIAGNOSIS — E860 Dehydration: Secondary | ICD-10-CM

## 2014-02-17 DIAGNOSIS — K219 Gastro-esophageal reflux disease without esophagitis: Secondary | ICD-10-CM

## 2014-02-17 DIAGNOSIS — I2510 Atherosclerotic heart disease of native coronary artery without angina pectoris: Secondary | ICD-10-CM

## 2014-02-17 NOTE — Patient Instructions (Signed)
Your physician recommends that you continue on your current medications as directed. Please refer to the Current Medication list given to you today.  Your physician recommends that you schedule a follow-up appointment with Dr. Jenkins Rouge

## 2014-02-17 NOTE — Assessment & Plan Note (Signed)
Patient is stable after recent cath in January revealing nonobstructive CAD. No recent complaints of chest pain.

## 2014-02-17 NOTE — Progress Notes (Signed)
HPI:  This is a 63 year old male patient of Dr. Percival Spanish who has a history of coronary artery disease status post 2 drug-eluting stents in 2004 with more drug-eluting stents in the distal RCA in 2012. He most recently had a cath in 12/2013 that showed nonobstructive disease. He saw Georjean Mode in followup and was doing well.  The patient comes in today because he had a bad week last week. He felt terrible in general and was having a lot of indigestion type pains and wasn't sleeping well. He saw his primary care and was started on Prilosec. He feels a lot better today. Also on Friday he wasn't feeling well and he had his blood pressure checked it was 0000000 systolic which is high for him. He also complains of extremely dry throat and having to drink a lot of water in the evening. He admits to drinking 4 large sweet tea daily but not much water.   He denies any chest pain, palpitations, dyspnea, dyspnea on exertion, dizziness, or presyncope. He is actually apologizing for coming in today.  No Known Allergies  Current Outpatient Prescriptions on File Prior to Visit: aspirin EC 81 MG tablet, Take 81 mg by mouth daily., Disp: , Rfl:  atorvastatin (LIPITOR) 20 MG tablet, Take 1 tablet (20 mg total) by mouth daily., Disp: 30 tablet, Rfl: 11 nitroGLYCERIN (NITROSTAT) 0.4 MG SL tablet, Place 1 tablet (0.4 mg total) under the tongue every 5 (five) minutes as needed., Disp: 25 tablet, Rfl: 6 omeprazole (PRILOSEC) 20 MG capsule, Take 20 mg by mouth daily., Disp: , Rfl:  ramipril (ALTACE) 2.5 MG capsule, Take 1 capsule (2.5 mg total) by mouth daily., Disp: 90 capsule, Rfl: 3  No current facility-administered medications on file prior to visit.   Past Medical History:   Diverticulosis of colon                                      CAD (coronary artery disease)                                  Comment:a. RCA occlusion with 2 drug-eluting stents               2004;  b. 90% stenosis distal to the stents in          the same artery treated with drug-eluting               stenting February 2012;  .c 12/2013 Cath: nonobs              dzs.   Melanoma                                                     Dyslipidemia                                                 Hypertension  Hyperlipidemia                                              Past Surgical History:   PTCA                                                          COLONOSCOPY                                                   MELANOMA EXCISION                                             wisdom teeth removal                                         Review of patient's family history indicates:   Heart attack                   Father                     Comment: died   Stroke                         Mother                   Hypertension                   Mother                   Hypertension                   Brother                  Colon cancer                   Neg Hx                   Pancreatic cancer              Neg Hx                   Stomach cancer                 Neg Hx                   Esophageal cancer              Neg Hx                   Rectal cancer                  Neg Hx  Social History   Marital Status: Married             Spouse Name:                      Years of Education:                 Number of children: 1           Occupational History Occupation          Fish farm manager            Comment              full time                                 Social History Main Topics   Smoking Status: Never Smoker                     Smokeless Status: Never Used                       Alcohol Use: Yes           0.6 oz/week      1 Cans of beer per week   Drug Use: No             Sexual Activity: Not on file        Other Topics            Concern   None on file  Social History Narrative   None on file    ROS: See history of present illness otherwise  negative   PHYSICAL EXAM: Well-nournished, in no acute distress. Neck: No JVD, HJR, Bruit, or thyroid enlargement  Lungs: No tachypnea, clear without wheezing, rales, or rhonchi  Cardiovascular: RRR, PMI not displaced, heart sounds normal, no murmurs, gallops, bruit, thrill, or heave.  Abdomen: BS normal. Soft without organomegaly, masses, lesions or tenderness.  Extremities: without cyanosis, clubbing or edema. Good distal pulses bilateral  SKin: Warm, no lesions or rashes   Musculoskeletal: No deformities  Neuro: no focal signs  BP 122/58  Pulse 84  Ht 5\' 11"  (1.803 m)  Wt 201 lb (91.173 kg)  BMI 28.05 kg/m2

## 2014-02-17 NOTE — Assessment & Plan Note (Signed)
Patient history the a lot of sweet tea every day. I believe this is contributing to his dry mouth and possible dehydration. I've asked him to decrease this significantly and try to drink 8 glasses of water daily to see if this doesn't help. He also may be suffering from allergies. Recommend over-the-counter loratadine as needed.

## 2014-02-17 NOTE — Assessment & Plan Note (Signed)
Patient was having a lot of indigestion last week. This is significantly better on Prilosec.

## 2014-02-17 NOTE — Assessment & Plan Note (Signed)
Blood pressure was up last Friday but has been stable. He felt terrible last week and was suffering from a lot of indigestion lack of sleep. His blood pressure came down nicely while in the doctor's office. He is on low dose Altace. Continue this dose.

## 2014-05-27 ENCOUNTER — Encounter: Payer: Self-pay | Admitting: Cardiology

## 2014-05-27 ENCOUNTER — Ambulatory Visit (INDEPENDENT_AMBULATORY_CARE_PROVIDER_SITE_OTHER): Payer: BC Managed Care – PPO | Admitting: Cardiology

## 2014-05-27 VITALS — BP 120/70 | HR 84 | Ht 71.0 in | Wt 193.0 lb

## 2014-05-27 DIAGNOSIS — I200 Unstable angina: Secondary | ICD-10-CM

## 2014-05-27 DIAGNOSIS — I252 Old myocardial infarction: Secondary | ICD-10-CM

## 2014-05-27 DIAGNOSIS — I10 Essential (primary) hypertension: Secondary | ICD-10-CM

## 2014-05-27 NOTE — Patient Instructions (Signed)
Your physician wants you to follow-up in: [place] will receive a reminder letter in the mail two months in advance. If you don't receive a letter, please call our office to schedule the follow-up appointment. b

## 2014-05-27 NOTE — Progress Notes (Signed)
   HPI The patient presents for follow up s/p stenting in 2012.  He repeat cath with a patent stent in Jan 2014.  Patient is stable after recent cath in January revealing nonobstructive CAD. He was seen in our office with some fluctuating BPs.  However, there were no acute complaints suggestive an active cardiac event in March.  He has since felt well.  The patient denies any new symptoms such as chest discomfort, neck or arm discomfort. There has been no new shortness of breath, PND or orthopnea. There have been no reported palpitations, presyncope or syncope.  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    No Known Allergies  Current Outpatient Prescriptions  Medication Sig Dispense Refill  . aspirin EC 81 MG tablet Take 81 mg by mouth daily.      Marland Kitchen atorvastatin (LIPITOR) 20 MG tablet Take 1 tablet (20 mg total) by mouth daily.  30 tablet  11  . nitroGLYCERIN (NITROSTAT) 0.4 MG SL tablet Place 1 tablet (0.4 mg total) under the tongue every 5 (five) minutes as needed.  25 tablet  6  . omeprazole (PRILOSEC) 20 MG capsule Take 20 mg by mouth daily.      . ramipril (ALTACE) 2.5 MG capsule Take 1 capsule (2.5 mg total) by mouth daily.  90 capsule  3   No current facility-administered medications for this visit.    Past Medical History  Diagnosis Date  . Diverticulosis of colon   . CAD (coronary artery disease)     a. RCA occlusion with 2 drug-eluting stents 2004;  b. 90% stenosis distal to the stents in the same artery treated with drug-eluting stenting February 2012;  .c 12/2013 Cath: nonobs dzs.  . Melanoma   . Dyslipidemia   . Hypertension   . Hyperlipidemia     Past Surgical History  Procedure Laterality Date  . Ptca    . Colonoscopy    . Melanoma excision    . Wisdom teeth removal      ROS:  As stated in the HPI and negative for all other systems.  PHYSICAL EXAM BP 120/70  Pulse 84  Ht 5\' 11"  (1.803 m)  Wt 193 lb (87.544 kg)  BMI 26.93  kg/m2 GENERAL:  Well appearing NECK:  No jugular venous distention, waveform within normal limits, carotid upstroke brisk and symmetric, no bruits, no thyromegaly LUNGS:  Clear to auscultation bilaterally BACK:  No CVA tenderness CHEST:  Unremarkable HEART:  PMI not displaced or sustained,S1 and S2 within normal limits, no S3, no S4, no clicks, no rubs, no murmurs ABD:  Flat, positive bowel sounds normal in frequency in pitch, no bruits, no rebound, no guarding, no midline pulsatile mass, no hepatomegaly, no splenomegaly EXT:  2 plus pulses throughout, no edema, no cyanosis no clubbing  EKG:  Sinus rhythm, rate 84, axis within normal limits, intervals within normal limits, no acute ST-T wave changes.  05/27/2014   ASSESSMENT AND PLAN  CORONARY ARTERY DISEASE -  Patient is stable after recent cath in January revealing nonobstructive CAD. No recent complaints of chest pain.  No futher testing is indicated.   HYPERTENSION - The blood pressure is at target. No change in medications is indicated. We will continue with therapeutic lifestyle changes (TLC).   DYSLIPIDEMIA -  I did stop niacin when I saw him previously.  LDL is excellent with a slightly lower HDL.  He will remain on these meds.

## 2014-06-02 DIAGNOSIS — L820 Inflamed seborrheic keratosis: Secondary | ICD-10-CM | POA: Diagnosis not present

## 2014-06-02 DIAGNOSIS — L570 Actinic keratosis: Secondary | ICD-10-CM | POA: Diagnosis not present

## 2014-06-02 DIAGNOSIS — L723 Sebaceous cyst: Secondary | ICD-10-CM | POA: Diagnosis not present

## 2014-06-02 DIAGNOSIS — Z85828 Personal history of other malignant neoplasm of skin: Secondary | ICD-10-CM | POA: Diagnosis not present

## 2014-07-20 DIAGNOSIS — H11009 Unspecified pterygium of unspecified eye: Secondary | ICD-10-CM | POA: Diagnosis not present

## 2014-07-20 DIAGNOSIS — H1130 Conjunctival hemorrhage, unspecified eye: Secondary | ICD-10-CM | POA: Diagnosis not present

## 2014-08-06 ENCOUNTER — Ambulatory Visit: Payer: BC Managed Care – PPO | Admitting: Pulmonary Disease

## 2014-09-20 DIAGNOSIS — H11002 Unspecified pterygium of left eye: Secondary | ICD-10-CM | POA: Diagnosis not present

## 2014-09-21 ENCOUNTER — Telehealth: Payer: Self-pay | Admitting: Cardiology

## 2014-09-21 NOTE — Telephone Encounter (Signed)
Pt called in stating that he will be having eye surgery on 11/12 and the doctor would like to know if he could stop taking the baby aspirin 1 wk prior to having his surgery. Please call  Thanks

## 2014-09-21 NOTE — Telephone Encounter (Signed)
Pt. Having eye surgery on 11/12; wants to know if he can stop taking asa 5 days before procedure

## 2014-09-22 NOTE — Telephone Encounter (Signed)
OK to hold ASA 

## 2014-09-23 NOTE — Telephone Encounter (Signed)
Pt. Called and informed about his ASA

## 2014-10-12 ENCOUNTER — Other Ambulatory Visit: Payer: Self-pay | Admitting: Cardiology

## 2014-10-20 DIAGNOSIS — H01004 Unspecified blepharitis left upper eyelid: Secondary | ICD-10-CM | POA: Diagnosis not present

## 2014-10-20 DIAGNOSIS — H01001 Unspecified blepharitis right upper eyelid: Secondary | ICD-10-CM | POA: Diagnosis not present

## 2014-10-20 DIAGNOSIS — H2513 Age-related nuclear cataract, bilateral: Secondary | ICD-10-CM | POA: Diagnosis not present

## 2014-10-20 DIAGNOSIS — H11002 Unspecified pterygium of left eye: Secondary | ICD-10-CM | POA: Diagnosis not present

## 2014-11-11 ENCOUNTER — Encounter (HOSPITAL_COMMUNITY): Payer: Self-pay | Admitting: Interventional Cardiology

## 2014-12-14 DIAGNOSIS — H11002 Unspecified pterygium of left eye: Secondary | ICD-10-CM | POA: Diagnosis not present

## 2014-12-22 ENCOUNTER — Ambulatory Visit (INDEPENDENT_AMBULATORY_CARE_PROVIDER_SITE_OTHER): Payer: Medicare Other | Admitting: Internal Medicine

## 2014-12-22 ENCOUNTER — Encounter: Payer: Self-pay | Admitting: Internal Medicine

## 2014-12-22 VITALS — BP 128/80 | HR 88 | Temp 98.6°F | Ht 71.0 in | Wt 201.0 lb

## 2014-12-22 DIAGNOSIS — I10 Essential (primary) hypertension: Secondary | ICD-10-CM

## 2014-12-22 DIAGNOSIS — I2510 Atherosclerotic heart disease of native coronary artery without angina pectoris: Secondary | ICD-10-CM | POA: Diagnosis not present

## 2014-12-22 DIAGNOSIS — Z23 Encounter for immunization: Secondary | ICD-10-CM

## 2014-12-22 DIAGNOSIS — Z0000 Encounter for general adult medical examination without abnormal findings: Secondary | ICD-10-CM

## 2014-12-22 DIAGNOSIS — L570 Actinic keratosis: Secondary | ICD-10-CM | POA: Diagnosis not present

## 2014-12-22 DIAGNOSIS — E785 Hyperlipidemia, unspecified: Secondary | ICD-10-CM | POA: Diagnosis not present

## 2014-12-22 DIAGNOSIS — N400 Enlarged prostate without lower urinary tract symptoms: Secondary | ICD-10-CM | POA: Diagnosis not present

## 2014-12-22 DIAGNOSIS — Z85828 Personal history of other malignant neoplasm of skin: Secondary | ICD-10-CM | POA: Diagnosis not present

## 2014-12-22 DIAGNOSIS — L820 Inflamed seborrheic keratosis: Secondary | ICD-10-CM | POA: Diagnosis not present

## 2014-12-22 LAB — COMPREHENSIVE METABOLIC PANEL
ALBUMIN: 4.1 g/dL (ref 3.5–5.2)
ALT: 21 U/L (ref 0–53)
AST: 22 U/L (ref 0–37)
Alkaline Phosphatase: 81 U/L (ref 39–117)
BUN: 11 mg/dL (ref 6–23)
CALCIUM: 9.7 mg/dL (ref 8.4–10.5)
CHLORIDE: 104 meq/L (ref 96–112)
CO2: 32 mEq/L (ref 19–32)
Creatinine, Ser: 0.93 mg/dL (ref 0.40–1.50)
GFR: 86.54 mL/min (ref >60.00)
Glucose, Bld: 96 mg/dL (ref 70–99)
Potassium: 4.6 mEq/L (ref 3.5–5.1)
Sodium: 139 mEq/L (ref 135–145)
Total Bilirubin: 0.7 mg/dL (ref 0.2–1.2)
Total Protein: 7 g/dL (ref 6.0–8.3)

## 2014-12-22 LAB — CBC WITH DIFFERENTIAL/PLATELET
Basophils Absolute: 0 10*3/uL (ref 0.0–0.1)
Basophils Relative: 0.3 % (ref 0.0–3.0)
Eosinophils Absolute: 0.2 10*3/uL (ref 0.0–0.7)
Eosinophils Relative: 2.4 % (ref 0.0–5.0)
HEMATOCRIT: 46.2 % (ref 39.0–52.0)
HEMOGLOBIN: 15.1 g/dL (ref 13.0–17.0)
LYMPHS ABS: 2.2 10*3/uL (ref 0.7–4.0)
LYMPHS PCT: 24.1 % (ref 12.0–46.0)
MCHC: 32.6 g/dL (ref 30.0–36.0)
MCV: 81.7 fl (ref 78.0–100.0)
MONOS PCT: 9.2 % (ref 3.0–12.0)
Monocytes Absolute: 0.8 10*3/uL (ref 0.1–1.0)
Neutro Abs: 5.9 10*3/uL (ref 1.4–7.7)
Neutrophils Relative %: 64 % (ref 43.0–77.0)
Platelets: 290 10*3/uL (ref 150.0–400.0)
RBC: 5.66 Mil/uL (ref 4.22–5.81)
RDW: 15.6 % — ABNORMAL HIGH (ref 11.5–15.5)
WBC: 9.2 10*3/uL (ref 4.0–10.5)

## 2014-12-22 LAB — LIPID PANEL
CHOLESTEROL: 145 mg/dL (ref 0–200)
HDL: 44.3 mg/dL (ref >39.00)
LDL CALC: 80 mg/dL (ref 0–99)
NonHDL: 100.7
TRIGLYCERIDES: 103 mg/dL (ref 0.0–149.0)
Total CHOL/HDL Ratio: 3
VLDL: 20.6 mg/dL (ref 0.0–40.0)

## 2014-12-22 LAB — T4, FREE: Free T4: 0.98 ng/dL (ref 0.60–1.60)

## 2014-12-22 LAB — PSA: PSA: 2.02 ng/mL (ref 0.10–4.00)

## 2014-12-22 NOTE — Assessment & Plan Note (Signed)
BP Readings from Last 3 Encounters:  12/22/14 128/80  05/27/14 120/70  02/17/14 122/58   Good control No changes needed

## 2014-12-22 NOTE — Assessment & Plan Note (Signed)
Will give prevnar Requests PSA--brother has prostate cancer Will take prevnar---needs pneumovax next year No flu shot Doesn't want zostavax

## 2014-12-22 NOTE — Assessment & Plan Note (Signed)
Mild symptoms ?No Rx needed ?

## 2014-12-22 NOTE — Addendum Note (Signed)
Addended by: Despina Hidden on: 12/22/2014 12:59 PM   Modules accepted: Orders

## 2014-12-22 NOTE — Assessment & Plan Note (Signed)
On statin Due for labs

## 2014-12-22 NOTE — Progress Notes (Signed)
Pre visit review using our clinic review tool, if applicable. No additional management support is needed unless otherwise documented below in the visit note. 

## 2014-12-22 NOTE — Patient Instructions (Signed)
DASH Eating Plan DASH stands for "Dietary Approaches to Stop Hypertension." The DASH eating plan is a healthy eating plan that has been shown to reduce high blood pressure (hypertension). Additional health benefits may include reducing the risk of type 2 diabetes mellitus, heart disease, and stroke. The DASH eating plan may also help with weight loss. WHAT DO I NEED TO KNOW ABOUT THE DASH EATING PLAN? For the DASH eating plan, you will follow these general guidelines:  Choose foods with a percent daily value for sodium of less than 5% (as listed on the food label).  Use salt-free seasonings or herbs instead of table salt or sea salt.  Check with your health care provider or pharmacist before using salt substitutes.  Eat lower-sodium products, often labeled as "lower sodium" or "no salt added."  Eat fresh foods.  Eat more vegetables, fruits, and low-fat dairy products.  Choose whole grains. Look for the word "whole" as the first word in the ingredient list.  Choose fish and skinless chicken or turkey more often than red meat. Limit fish, poultry, and meat to 6 oz (170 g) each day.  Limit sweets, desserts, sugars, and sugary drinks.  Choose heart-healthy fats.  Limit cheese to 1 oz (28 g) per day.  Eat more home-cooked food and less restaurant, buffet, and fast food.  Limit fried foods.  Cook foods using methods other than frying.  Limit canned vegetables. If you do use them, rinse them well to decrease the sodium.  When eating at a restaurant, ask that your food be prepared with less salt, or no salt if possible. WHAT FOODS CAN I EAT? Seek help from a dietitian for individual calorie needs. Grains Whole grain or whole wheat bread. Brown rice. Whole grain or whole wheat pasta. Quinoa, bulgur, and whole grain cereals. Low-sodium cereals. Corn or whole wheat flour tortillas. Whole grain cornbread. Whole grain crackers. Low-sodium crackers. Vegetables Fresh or frozen vegetables  (raw, steamed, roasted, or grilled). Low-sodium or reduced-sodium tomato and vegetable juices. Low-sodium or reduced-sodium tomato sauce and paste. Low-sodium or reduced-sodium canned vegetables.  Fruits All fresh, canned (in natural juice), or frozen fruits. Meat and Other Protein Products Ground beef (85% or leaner), grass-fed beef, or beef trimmed of fat. Skinless chicken or turkey. Ground chicken or turkey. Pork trimmed of fat. All fish and seafood. Eggs. Dried beans, peas, or lentils. Unsalted nuts and seeds. Unsalted canned beans. Dairy Low-fat dairy products, such as skim or 1% milk, 2% or reduced-fat cheeses, low-fat ricotta or cottage cheese, or plain low-fat yogurt. Low-sodium or reduced-sodium cheeses. Fats and Oils Tub margarines without trans fats. Light or reduced-fat mayonnaise and salad dressings (reduced sodium). Avocado. Safflower, olive, or canola oils. Natural peanut or almond butter. Other Unsalted popcorn and pretzels. The items listed above may not be a complete list of recommended foods or beverages. Contact your dietitian for more options. WHAT FOODS ARE NOT RECOMMENDED? Grains White bread. White pasta. White rice. Refined cornbread. Bagels and croissants. Crackers that contain trans fat. Vegetables Creamed or fried vegetables. Vegetables in a cheese sauce. Regular canned vegetables. Regular canned tomato sauce and paste. Regular tomato and vegetable juices. Fruits Dried fruits. Canned fruit in light or heavy syrup. Fruit juice. Meat and Other Protein Products Fatty cuts of meat. Ribs, chicken wings, bacon, sausage, bologna, salami, chitterlings, fatback, hot dogs, bratwurst, and packaged luncheon meats. Salted nuts and seeds. Canned beans with salt. Dairy Whole or 2% milk, cream, half-and-half, and cream cheese. Whole-fat or sweetened yogurt. Full-fat   cheeses or blue cheese. Nondairy creamers and whipped toppings. Processed cheese, cheese spreads, or cheese  curds. Condiments Onion and garlic salt, seasoned salt, table salt, and sea salt. Canned and packaged gravies. Worcestershire sauce. Tartar sauce. Barbecue sauce. Teriyaki sauce. Soy sauce, including reduced sodium. Steak sauce. Fish sauce. Oyster sauce. Cocktail sauce. Horseradish. Ketchup and mustard. Meat flavorings and tenderizers. Bouillon cubes. Hot sauce. Tabasco sauce. Marinades. Taco seasonings. Relishes. Fats and Oils Butter, stick margarine, lard, shortening, ghee, and bacon fat. Coconut, palm kernel, or palm oils. Regular salad dressings. Other Pickles and olives. Salted popcorn and pretzels. The items listed above may not be a complete list of foods and beverages to avoid. Contact your dietitian for more information. WHERE CAN I FIND MORE INFORMATION? National Heart, Lung, and [employer] Institute: www.nhlbi.nih.gov/health/health-topics/topics/dash/ Document Released: 11/08/2011 Document Revised: 04/05/2014 Document Reviewed: 09/23/2013 Exit[employer] Patient Information 2015 [employer]. This information is not intended to replace advice given to you by your health care provider. Make sure you discuss any questions you have with your health care provider. Exercise to Lose Weight Exercise and a healthy diet may help you lose weight. Your doctor may suggest specific exercises. EXERCISE IDEAS AND TIPS  Choose low-cost things you enjoy doing, such as walking, bicycling, or exercising to workout videos.  Take stairs instead of the elevator.  Walk during your lunch break.  Park your car further away from work or school.  Go to a gym or an exercise class.  Start with 5 to 10 minutes of exercise each day. Build up to 30 minutes of exercise 4 to 6 days a week.  Wear shoes with good support and comfortable clothes.  Stretch before and after working out.  Work out until you breathe harder and your heart beats faster.  Drink extra water when you exercise.  Do not do so much that you  hurt yourself, feel dizzy, or get very short of breath. Exercises that burn about 150 calories:  Running 1  miles in 15 minutes.  Playing volleyball for 45 to 60 minutes.  Washing and waxing a car for 45 to 60 minutes.  Playing touch football for 45 minutes.  Walking 1  miles in 35 minutes.  Pushing a stroller 1  miles in 30 minutes.  Playing basketball for 30 minutes.  Raking leaves for 30 minutes.  Bicycling 5 miles in 30 minutes.  Walking 2 miles in 30 minutes.  Dancing for 30 minutes.  Shoveling snow for 15 minutes.  Swimming laps for 20 minutes.  Walking up stairs for 15 minutes.  Bicycling 4 miles in 15 minutes.  Gardening for 30 to 45 minutes.  Jumping rope for 15 minutes.  Washing windows or floors for 45 to 60 minutes. Document Released: 12/22/2010 Document Revised: 02/11/2012 Document Reviewed: 12/22/2010 [employer] Patient Information 2015 [employer]. This information is not intended to replace advice given to you by your health care provider. Make sure you discuss any questions you have with your health care provider.  

## 2014-12-22 NOTE — Assessment & Plan Note (Signed)
On appropriate meds No symptoms lately---does occ get funny feeling in left arm but not ischemic

## 2014-12-22 NOTE — Progress Notes (Signed)
Subjective:    Patient ID: Juan [name], male    DOB: 1951/05/13, 64 y.o.   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 aspirin EC 81 MG tablet Take 81 mg by mouth daily.    Marland Kitchen atorvastatin (LIPITOR) 20 MG tablet TAKE 1 TABLET BY MOUTH DAILY 60 tablet 6  . nitroGLYCERIN (NITROSTAT) 0.4 MG SL tablet Place 1 tablet (0.4 mg total) under the tongue every 5 (five) minutes as needed. 25 tablet 6  . omeprazole (PRILOSEC) 20 MG capsule Take 20 mg by mouth daily.    . ramipril (ALTACE) 2.5 MG capsule Take 1 capsule (2.5 mg total) by mouth daily. 90 capsule 3   No current facility-administered medications on file prior to visit.    No Known Allergies  Past Medical History  Diagnosis Date  . Diverticulosis of colon   . CAD (coronary artery disease)     a. RCA occlusion with 2 drug-eluting stents 2004;  b. 90% stenosis distal to the stents in the same artery treated with drug-eluting stenting February 2012;  .c 12/2013 Cath: nonobs dzs.  . Melanoma   . Hypertension   . Hyperlipidemia     Past Surgical History  Procedure Laterality Date  . Ptca    . Colonoscopy    . Melanoma excision      3 seperate ones  . Wisdom teeth removal    . Left heart catheterization with coronary angiogram N/A 12/14/2013    Procedure: LEFT HEART CATHETERIZATION WITH CORONARY ANGIOGRAM;  Surgeon: Sinclair Grooms, MD;  [facility]: [facility] C[facility] LAB;  Service: Cardiovascular;  Laterality: N/A;    Family History  Problem Relation Age of Onset  . Heart attack Father 20    died  . Stroke Mother   . Hypertension Mother   . Colon cancer Neg Hx     . Pancreatic cancer Neg Hx   . Stomach cancer Neg Hx   . Esophageal cancer Neg Hx   . Rectal cancer Neg Hx   . Cancer Neg Hx   . Diabetes Neg Hx   . Hypertension Brother     History   Social History  . Marital Status: Married    Spouse Name: N/A    Number of Children: 1  . 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 Smoking status: Never Smoker   . Smokeless tobacco: Never Used  . Alcohol Use: 0.6 oz/week    1 Cans of beer per week  . Drug Use: No  . Sexual Activity: Not on file   Other Topics Concern  . Not on file   Social History Narrative   No living will or health care POA   Would accept resuscitation attempts      Review of Systems  Constitutional: Negative for fatigue and unexpected weight change.       Wears seat belt  HENT: Negative for dental problem, hearing loss and tinnitus.        Regular with denitst  Eyes: Negative for visual disturbance.  No diplopia or unilateral vision loss Pterygium just removed on left  Respiratory: Negative for cough, chest tightness and shortness of breath.   Cardiovascular: Negative for chest pain, palpitations and leg swelling.  Gastrointestinal: Negative for abdominal pain, constipation and blood in stool.       No longer on omeprazole---rare GERD. Now just uses tums Hemorrhoids will act up when stressed  Endocrine: Negative for polydipsia and polyuria.  Genitourinary: Positive for difficulty urinating. Negative for urgency.       Nocturia x 2 Slightly slow stream No sexual problems  Musculoskeletal: Positive for back pain.       Rare episodes of back pain--especially after hard work with animals (horses, dogs)  Skin:       Keeps up with derm  Allergic/Immunologic: Positive for environmental allergies. Negative for immunocompromised state.       Mild pollen sensitivity  Neurological: Negative for dizziness,  syncope, weakness, light-headedness, numbness and headaches.       Occ funny feeling in left arm  Hematological: Negative for adenopathy. Does not bruise/bleed easily.  Psychiatric/Behavioral: Negative for sleep disturbance and dysphoric mood.       Stress from work       Objective:   Physical Exam  Constitutional: He is oriented to person, place, and time. He appears well-developed and well-nourished. No distress.  HENT:  Head: Normocephalic and atraumatic.  Right Ear: External ear normal.  Left Ear: External ear normal.  Mouth/Throat: Oropharynx is clear and moist. No oropharyngeal exudate.  Cerumen in canals  Eyes: EOM are normal. Pupils are equal, round, and reactive to light.  Redness on left from surgery  Neck: Normal range of motion. Neck supple. No thyromegaly present.  Cardiovascular: Normal rate, regular rhythm, normal heart sounds and intact distal pulses.  Exam reveals no gallop.   No murmur heard. Pulmonary/Chest: Effort normal and breath sounds normal. No respiratory distress. He has no wheezes. He has no rales.  Abdominal: Soft. There is no tenderness.  Musculoskeletal: He exhibits no edema or tenderness.  Lymphadenopathy:    He has no cervical adenopathy.  Neurological: He is alert and oriented to person, place, and time.  Skin: Skin is warm. No rash noted.  Psychiatric: He has a normal mood and affect. His behavior is normal.          Assessment & Plan:

## 2014-12-23 ENCOUNTER — Encounter: Payer: Self-pay | Admitting: *Deleted

## 2014-12-23 ENCOUNTER — Telehealth: Payer: Self-pay | Admitting: Internal Medicine

## 2014-12-23 NOTE — Telephone Encounter (Signed)
emmi emailed °

## 2015-02-15 ENCOUNTER — Other Ambulatory Visit: Payer: Self-pay | Admitting: Cardiology

## 2015-03-21 DIAGNOSIS — H01005 Unspecified blepharitis left lower eyelid: Secondary | ICD-10-CM | POA: Diagnosis not present

## 2015-03-21 DIAGNOSIS — H01004 Unspecified blepharitis left upper eyelid: Secondary | ICD-10-CM | POA: Diagnosis not present

## 2015-05-04 ENCOUNTER — Other Ambulatory Visit: Payer: Self-pay | Admitting: Cardiology

## 2015-05-04 MED ORDER — ATORVASTATIN CALCIUM 20 MG PO TABS: 20.0000 mg | ORAL_TABLET | Freq: Every day | ORAL | Status: DC

## 2015-05-04 MED ORDER — RAMIPRIL 2.5 MG PO CAPS: 2.5000 mg | ORAL_CAPSULE | Freq: Every day | ORAL | Status: DC

## 2015-05-04 NOTE — Telephone Encounter (Signed)
Rx(s) sent to pharmacy electronically.  

## 2015-05-04 NOTE — Telephone Encounter (Signed)
Wants to know if they can for 90 days supply for Ramapril and Atorvastatin please?

## 2015-05-09 ENCOUNTER — Encounter: Payer: Self-pay | Admitting: Internal Medicine

## 2015-05-09 ENCOUNTER — Ambulatory Visit (INDEPENDENT_AMBULATORY_CARE_PROVIDER_SITE_OTHER): Payer: Medicare Other | Admitting: Internal Medicine

## 2015-05-09 VITALS — BP 126/70 | HR 98 | Temp 97.8°F | Wt 197.0 lb

## 2015-05-09 DIAGNOSIS — R070 Pain in throat: Secondary | ICD-10-CM | POA: Diagnosis not present

## 2015-05-09 DIAGNOSIS — I2510 Atherosclerotic heart disease of native coronary artery without angina pectoris: Secondary | ICD-10-CM | POA: Diagnosis not present

## 2015-05-09 NOTE — Progress Notes (Signed)
Pre visit review using our clinic review tool, if applicable. No additional management support is needed unless otherwise documented below in the visit note. 

## 2015-05-09 NOTE — Progress Notes (Signed)
Subjective:    Patient ID: Juan [name], male    DOB: 1951-02-11, 64 y.o.   MRN: [id number]  HPI  Pt presents to the clinic today with c/o sore throat. He reports this started a few weeks ago. When he raises his voice or swallows he feels a "pinprick" sensation. He reports it is sharp and stabbing. It is not overly painful and very quick. He denies difficulty swallowing or choking on his food. He denies ear pain, runny nose, nasal congestion or occogh.  He has not tried anything OTC for it. He does have a history of GERD. He does not take his Prilosec daily. He was told a few years ago that he needed to have his esophagus stretched, but he never had the procedure done. He thinks his esophageal issue is separate from this current issue. He does not smoke, but is very concerned that this may be cancer. He has not had sick contacts.  Review of Systems      Past Medical History  Diagnosis Date  . Diverticulosis of colon   . CAD (coronary artery disease)     a. RCA occlusion with 2 drug-eluting stents 2004;  b. 90% stenosis distal to the stents in the same artery treated with drug-eluting stenting February 2012;  .c 12/2013 Cath: nonobs dzs.  . Melanoma   . Hypertension   . Hyperlipidemia   . GERD (gastroesophageal reflux disease)     Current Outpatient Prescriptions  Medication Sig Dispense Refill  . aspirin EC 81 MG tablet Take 81 mg by mouth daily.    Marland Kitchen atorvastatin (LIPITOR) 20 MG tablet Take 1 tablet (20 mg total) by mouth daily. 90 tablet 0  . nitroGLYCERIN (NITROSTAT) 0.4 MG SL tablet Place 1 tablet (0.4 mg total) under the tongue every 5 (five) minutes as needed. 25 tablet 6  . omeprazole (PRILOSEC) 20 MG capsule Take 20 mg by mouth daily as needed.     . ramipril (ALTACE) 2.5 MG capsule Take 1 capsule (2.5 mg total) by mouth daily. 90 capsule 0   No current facility-administered medications for this visit.    No Known Allergies  Family History  Problem Relation Age of Onset   . Heart attack Father 18    died  . Stroke Mother   . Hypertension Mother   . Colon cancer Neg Hx   . Pancreatic cancer Neg Hx   . Stomach cancer Neg Hx   . Esophageal cancer Neg Hx   . Rectal cancer Neg Hx   . Diabetes Neg Hx   . Hypertension Brother   . Cancer Brother     prostate    History   Social History  . Marital Status: Married    Spouse Name: N/A  . Number of Children: 1  . 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 Smoking status: Never Smoker   . Smokeless tobacco: Never Used  . Alcohol Use: 0.6 oz/week    1 Cans of beer per week     Comment: occasional  . Drug Use: No  . Sexual Activity: Not on file   Other Topics Concern  . Not on file   Social History Narrative   No living will or health care POA   Would accept resuscitation attempts        Constitutional: Denies fever, malaise, fatigue, headache or abrupt weight changes.  HEENT: Pt reports abnormal throat sensation. Denies eye pain, eye redness, ear pain, ringing in the ears, wax buildup, runny nose, nasal congestion, bloody nose. Respiratory: Denies difficulty breathing, shortness of breath, cough or sputum production.   Cardiovascular: Denies chest pain, chest tightness, palpitations or swelling in the hands or feet.  Gastrointestinal: Denies abdominal pain, bloating, constipation, diarrhea or blood in the stool.   No other specific complaints in a complete review of systems (except as listed in HPI above).  Objective:   Physical Exam  BP 126/70 mmHg  Pulse 98  Temp(Src) 97.8 F (36.6 C) (Oral)  Wt 197 lb (89.359 kg)  SpO2 98% Wt Readings from Last 3 Encounters:  05/09/15 197 lb (89.359 kg)  12/22/14 201 lb (91.173 kg)  05/27/14 193 lb (87.544 kg)    General: Appears his stated age, well developed, well nourished in NAD. HEENT: Head: normal shape and size; Eyes: sclera white, no  icterus, conjunctiva pink; Ears: bilateral cerumen impaction;  Throat/Mouth: Teeth present, mucosa pink and moist, no exudate, lesions or ulcerations noted.  Neck: No adenopathy noted.  Cardiovascular: Normal rate and rhythm. S1,S2 noted.  No murmur, rubs or gallops noted.  Pulmonary/Chest: Normal effort and positive vesicular breath sounds. No respiratory distress. No wheezes, rales or ronchi noted.  Abdomen: Soft and nontender. Normal bowel sounds, no bruits noted. No distention or masses noted.  Neurological: Alert and oriented.  Psychiatric: He seems very anxious today.    BMET    Component Value Date/Time   NA 139 12/22/2014 1335   K 4.6 12/22/2014 1335   CL 104 12/22/2014 1335   CO2 32 12/22/2014 1335   GLUCOSE 96 12/22/2014 1335   BUN 11 12/22/2014 1335   CREATININE 0.93 12/22/2014 1335   CALCIUM 9.7 12/22/2014 1335   GFRNONAA 86* 12/14/2013 1118   GFRAA >90 12/14/2013 1118    Lipid Panel     Component Value Date/Time   CHOL 145 12/22/2014 1335   TRIG 103.0 12/22/2014 1335   HDL 44.30 12/22/2014 1335   CHOLHDL 3 12/22/2014 1335   VLDL 20.6 12/22/2014 1335   LDLCALC 80 12/22/2014 1335    CBC    Component Value Date/Time   WBC 9.2 12/22/2014 1335   RBC 5.66 12/22/2014 1335   HGB 15.1 12/22/2014 1335   HCT 46.2 12/22/2014 1335   PLT 290.0 12/22/2014 1335   MCV 81.7 12/22/2014 1335   MCH 27.8 12/14/2013 1118   MCHC 32.6 12/22/2014 1335   RDW 15.6* 12/22/2014 1335   LYMPHSABS 2.2 12/22/2014 1335   MONOABS 0.8 12/22/2014 1335   EOSABS 0.2 12/22/2014 1335   BASOSABS 0.0 12/22/2014 1335    Hgb A1C No results found for: HGBA1C       Assessment & Plan:   Throat discomfort:  He thinks this is a vocal cord problem Exam normal today He would like referral to ENT today for further evaluation Referral placed for ENT- see Vaughan Basta on the way out to schedule ? If this could be a manifestation of his GERD Advised him to restart the Prilosec daily to see if his  symptoms resolve   RTC as needed or if symptoms persist or worsen

## 2015-05-09 NOTE — Patient Instructions (Signed)

## 2015-05-17 DIAGNOSIS — J029 Acute pharyngitis, unspecified: Secondary | ICD-10-CM | POA: Diagnosis not present

## 2015-05-17 DIAGNOSIS — R1314 Dysphagia, pharyngoesophageal phase: Secondary | ICD-10-CM | POA: Diagnosis not present

## 2015-05-17 DIAGNOSIS — J387 Other diseases of larynx: Secondary | ICD-10-CM | POA: Diagnosis not present

## 2015-05-18 ENCOUNTER — Other Ambulatory Visit: Payer: Self-pay | Admitting: Otolaryngology

## 2015-05-18 ENCOUNTER — Ambulatory Visit (INDEPENDENT_AMBULATORY_CARE_PROVIDER_SITE_OTHER): Payer: Medicare Other | Admitting: Cardiology

## 2015-05-18 ENCOUNTER — Encounter: Payer: Self-pay | Admitting: Cardiology

## 2015-05-18 VITALS — BP 100/62 | HR 80 | Ht 71.0 in | Wt 196.6 lb

## 2015-05-18 DIAGNOSIS — I2510 Atherosclerotic heart disease of native coronary artery without angina pectoris: Secondary | ICD-10-CM | POA: Diagnosis not present

## 2015-05-18 DIAGNOSIS — I10 Essential (primary) hypertension: Secondary | ICD-10-CM

## 2015-05-18 DIAGNOSIS — J029 Acute pharyngitis, unspecified: Secondary | ICD-10-CM

## 2015-05-18 NOTE — Progress Notes (Signed)
HPI The patient presents for follow up s/p stenting in 2012.  He repeat cath with a patent stent in Jan 2014.  Patient is stable after recent cath in January revealing nonobstructive CAD.  Since I last saw him he has done well.  The patient denies any new symptoms such as chest discomfort, neck or arm discomfort. There has been no new shortness of breath, PND or orthopnea. There have been no reported palpitations, presyncope or syncope.   He has had increased BMs.    No Known Allergies  Current Outpatient Prescriptions  Medication Sig Dispense Refill  . aspirin EC 81 MG tablet Take 81 mg by mouth daily.    Marland Kitchen atorvastatin (LIPITOR) 20 MG tablet Take 1 tablet (20 mg total) by mouth daily. 90 tablet 0  . nitroGLYCERIN (NITROSTAT) 0.4 MG SL tablet Place 1 tablet (0.4 mg total) under the tongue every 5 (five) minutes as needed. 25 tablet 6  . omeprazole (PRILOSEC) 20 MG capsule Take 20 mg by mouth daily as needed.     . ramipril (ALTACE) 2.5 MG capsule Take 1 capsule (2.5 mg total) by mouth daily. 90 capsule 0  . tretinoin (RETIN-A) 0.025 % cream Apply 1 application topically daily.     No current facility-administered medications for this visit.    Past Medical History  Diagnosis Date  . Diverticulosis of colon   . CAD (coronary artery disease)     a. RCA occlusion with 2 drug-eluting stents 2004;  b. 90% stenosis distal to the stents in the same artery treated with drug-eluting stenting February 2012;  .c 12/2013 Cath: nonobs dzs.  . Melanoma   . Hypertension   . Hyperlipidemia   . GERD (gastroesophageal reflux disease)     Past Surgical History  Procedure Laterality Date  . Ptca    . Colonoscopy    . Melanoma excision      3 seperate ones  . Wisdom teeth removal    . Left heart catheterization with coronary angiogram N/A 12/14/2013    Procedure: LEFT HEART CATHETERIZATION WITH CORONARY ANGIOGRAM;  Surgeon: Sinclair Grooms, MD;  Location: [facility] CATH LAB;  Service: Cardiovascular;   Laterality: N/A;    ROS:  As stated in the HPI and negative for all other systems.  PHYSICAL EXAM BP 100/62 mmHg  Pulse 80  Ht 5\' 11"  (1.803 m)  Wt 196 lb 9.6 oz (89.177 kg)  BMI 27.43 kg/m2 GENERAL:  Well appearing NECK:  No jugular venous distention, waveform within normal limits, carotid upstroke brisk and symmetric, no bruits, no thyromegaly LUNGS:  Clear to auscultation bilaterally BACK:  No CVA tenderness CHEST:  Unremarkable HEART:  PMI not displaced or sustained,S1 and S2 within normal limits, no S3, no S4, no clicks, no rubs, no murmurs ABD:  Flat, positive bowel sounds normal in frequency in pitch, no bruits, no rebound, no guarding, no midline pulsatile mass, no hepatomegaly, no splenomegaly EXT:  2 plus pulses throughout, no edema, no cyanosis no clubbing  EKG:  Sinus rhythm, rate 80, axis within normal limits, intervals within normal limits, no acute ST-T wave changes.  05/18/2015   ASSESSMENT AND PLAN  CORONARY ARTERY DISEASE -  The patient has no new sypmtoms.  No further cardiovascular testing is indicated.  We will continue with aggressive risk reduction and meds as listed.  HYPERTENSION - His blood pressures actually running low. He'll let me know if this continues I would probably back off on his ACE inhibitor.  DYSLIPIDEMIA -  His LDL was 80 and HDL 44.3.  No change in therapy is indicated.

## 2015-05-18 NOTE — Patient Instructions (Signed)
Your physician wants you to follow-up in: 1 Year. You will receive a reminder letter in the mail two months in advance. If you don't receive a letter, please call our office to schedule the follow-up appointment.  

## 2015-05-20 ENCOUNTER — Ambulatory Visit
Admission: RE | Admit: 2015-05-20 | Discharge: 2015-05-20 | Disposition: A | Discharge status: Home / Self Care | Payer: Medicare Other | Source: Ambulatory Visit | Attending: Otolaryngology | Admitting: Otolaryngology

## 2015-05-20 DIAGNOSIS — K449 Diaphragmatic hernia without obstruction or gangrene: Secondary | ICD-10-CM | POA: Diagnosis not present

## 2015-05-20 DIAGNOSIS — R1310 Dysphagia, unspecified: Secondary | ICD-10-CM | POA: Diagnosis not present

## 2015-05-20 DIAGNOSIS — J029 Acute pharyngitis, unspecified: Secondary | ICD-10-CM

## 2015-05-25 ENCOUNTER — Telehealth: Payer: Self-pay | Admitting: *Deleted

## 2015-05-25 NOTE — Telephone Encounter (Signed)
Will fax note to the number provided

## 2015-05-25 NOTE — Telephone Encounter (Signed)
Based on ACC/AHA guidelines, the patient would be at acceptable risk for the planned procedure without further cardiovascular testing.

## 2015-05-25 NOTE — Telephone Encounter (Signed)
Patient is needing cardiac clearance for a 30 min outpatient surgery under general anesthesia. Patient needs to have an ESOPHAGOSCOPY with dr Garret Reddish rosen. Will forward for dr hochrein's review.

## 2015-05-27 ENCOUNTER — Ambulatory Visit: Payer: Self-pay | Admitting: Otolaryngology

## 2015-05-27 NOTE — H&P (Signed)
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 Pharyngoesophageal dysphagia (787.24) (R13.14). Sore throat (462) (J02.9). Orders  Barium Swallow; Requested for: 17 May 2015. Discussed  All the symptoms can be related to chronic reflux. He may have a stricture or hypertrophy of the cricopharyngeus. The barium swallow should answer these questions. In the meantime, recommend complete caffeine avoidance. We may refer him to GI depending on what the barium swallow shows. Reason For Visit  Juan [name] is here today at the kind request of Webb Silversmith for consultation and opinion for sore throat. HPI  Several month history of a slight soreness in his throat, intermittent stickiness and a tickle especially if he uses his voice. For about 10 years, he occasionally has food stuck in his throat and he has to regurgitate it. He has had intermittent heartburn over the years. He is taken amoxicillin in the past. He drinks about 3 cups of ice tea daily. Allergies  No Known Drug Allergies. Current Meds  Aspirin EC 81 MG Oral Tablet Delayed Release;; RPT Altace CAPS (Ramipril);; RPT Statins;; RPT. Active Problems  Blockage of coronary artery of heart   (410.90) (I21.3). PMH  History of malignant melanoma (V10.82) (R74.081) History of myocardial infarction (412) (I25.2). PSH  Oral Surgery Tooth Extraction. Family Hx  Family history of heart disease: Father (V8.49) (Z75.49) Family history of malignant neoplasm of prostate: Brother (K48.18) (Z60.42) Family history of stroke: Mother (V17.1) (Z82.3). Personal Hx  Never smoker No caffeine use Non-smoker (V49.89) (Z78.9) Social alcohol use (Z78.9). ROS  Systemic: Not feeling tired (fatigue).  No fever, no night sweats, and no recent weight loss. Head: No headache. Eyes: No eye symptoms. Otolaryngeal: No hearing loss, no earache, no tinnitus, and no purulent nasal discharge.  No nasal passage blockage (stuffiness), no snoring, and no  sneezing.  Hoarseness  and sore throat. Cardiovascular: No chest pain or discomfort  and no palpitations. Pulmonary: No dyspnea, no cough, and no wheezing. Gastrointestinal: Dysphagia.  No heartburn.  No nausea, no abdominal pain, and no melena.  No diarrhea. Genitourinary: No dysuria. Endocrine: No muscle weakness. Musculoskeletal: No calf muscle cramps, no arthralgias, and no soft tissue swelling. Neurological: No dizziness, no fainting, no tingling, and no numbness. Psychological: No anxiety  and no depression. Skin: No rash. 12 system ROS was obtained and reviewed on the Health Maintenance form dated today.  Positive responses are shown above.  If the symptom is not checked, the patient has denied it. Vital Signs   Recorded by Iran Sizer on 17 May 2015 01:13 PM BP:104/70,  BSA Calculated: 2.10 ,  BMI Calculated: 27.48 ,  Weight: 197 lb , BMI: 27.5 kg/m2,  Height: 5 ft 11 in. Physical Exam  APPEARANCE: Well developed, well nourished, in no acute distress.  Normal affect, in a pleasant mood.  Oriented to time, place and person. COMMUNICATION: Normal voice   HEAD & FACE:  No scars, lesions or masses of head and face.  Sinuses nontender to palpation.  Salivary glands without mass or tenderness.  Facial strength symmetric.  No facial lesion, scars, or mass. EYES: EOMI with normal primary gaze alignment. Visual acuity grossly intact.  PERRLA EXTERNAL EAR & NOSE: No scars, lesions or masses  EAC & TYMPANIC MEMBRANE:  EAC shows no obstructing lesions or debris and tympanic membranes are normal bilaterally with good movement to insufflation. GROSS HEARING: Normal  TMJ:  Nontender  INTRANASAL EXAM: No polyps or purulence.  NASOPHARYNX: Normal, without lesions. LIPS, TEETH & GUMS: No lip lesions,  normal dentition and normal gums. ORAL CAVITY/OROPHARYNX:  Oral mucosa moist without lesion or asymmetry of the palate, tongue, tonsil or posterior pharynx. LARYNX (mirror exam):  No lesions  of the epiglottis, false cord or TVC's and cords move well to phonation. HYPOPHARYNX (mirror exam): No lesions, asymmetry or pooling of secretions. NECK:  Supple without adenopathy or mass. THYROID:  Normal with no masses palpable.  NEUROLOGIC:  No gross CN deficits. No nystagmus noted.   LYMPHATIC:  No enlarged nodes palpable. Signature  Electronically signed by : Izora Gala  M.D.; 05/17/2015 1:30 PM EST.

## 2015-05-30 ENCOUNTER — Encounter (HOSPITAL_COMMUNITY)
Admission: RE | Admit: 2015-05-30 | Discharge: 2015-05-30 | Disposition: A | Discharge status: Home / Self Care | Payer: Medicare Other | Source: Ambulatory Visit | Attending: Otolaryngology | Admitting: Otolaryngology

## 2015-05-30 ENCOUNTER — Encounter (HOSPITAL_COMMUNITY): Payer: Self-pay

## 2015-05-30 DIAGNOSIS — Z7982 Long term (current) use of aspirin: Secondary | ICD-10-CM | POA: Diagnosis not present

## 2015-05-30 DIAGNOSIS — Z79899 Other long term (current) drug therapy: Secondary | ICD-10-CM | POA: Diagnosis not present

## 2015-05-30 DIAGNOSIS — J029 Acute pharyngitis, unspecified: Secondary | ICD-10-CM | POA: Diagnosis not present

## 2015-05-30 DIAGNOSIS — K219 Gastro-esophageal reflux disease without esophagitis: Secondary | ICD-10-CM | POA: Diagnosis not present

## 2015-05-30 DIAGNOSIS — I252 Old myocardial infarction: Secondary | ICD-10-CM | POA: Diagnosis not present

## 2015-05-30 DIAGNOSIS — Z8042 Family history of malignant neoplasm of prostate: Secondary | ICD-10-CM | POA: Diagnosis not present

## 2015-05-30 DIAGNOSIS — K228 Other specified diseases of esophagus: Secondary | ICD-10-CM | POA: Diagnosis not present

## 2015-05-30 DIAGNOSIS — Z823 Family history of stroke: Secondary | ICD-10-CM | POA: Diagnosis not present

## 2015-05-30 DIAGNOSIS — R1314 Dysphagia, pharyngoesophageal phase: Secondary | ICD-10-CM | POA: Diagnosis not present

## 2015-05-30 DIAGNOSIS — Z85820 Personal history of malignant melanoma of skin: Secondary | ICD-10-CM | POA: Diagnosis not present

## 2015-05-30 DIAGNOSIS — Z8249 Family history of ischemic heart disease and other diseases of the circulatory system: Secondary | ICD-10-CM | POA: Diagnosis not present

## 2015-05-30 HISTORY — DX: Other complications of anesthesia, initial encounter: T88.59XA

## 2015-05-30 HISTORY — DX: Adverse effect of unspecified anesthetic, initial encounter: T41.45XA

## 2015-05-30 HISTORY — DX: Other specified postprocedural states: R11.2

## 2015-05-30 HISTORY — DX: Other specified postprocedural states: Z98.890

## 2015-05-30 HISTORY — DX: Nausea with vomiting, unspecified: Z98.890

## 2015-05-30 HISTORY — DX: Reserved for inherently not codable concepts without codable children: IMO0001

## 2015-05-30 LAB — BASIC METABOLIC PANEL
ANION GAP: 7 (ref 5–15)
BUN: 8 mg/dL (ref 6–20)
CALCIUM: 8.8 mg/dL — AB (ref 8.9–10.3)
CO2: 30 mmol/L (ref 22–32)
CREATININE: 1.09 mg/dL (ref 0.61–1.24)
Chloride: 102 mmol/L (ref 101–111)
Glucose, Bld: 133 mg/dL — ABNORMAL HIGH (ref 65–99)
Potassium: 3.9 mmol/L (ref 3.5–5.1)
SODIUM: 139 mmol/L (ref 135–145)

## 2015-05-30 LAB — CBC
HEMATOCRIT: 43.3 % (ref 39.0–52.0)
Hemoglobin: 14 g/dL (ref 13.0–17.0)
MCH: 26.8 pg (ref 26.0–34.0)
MCHC: 32.3 g/dL (ref 30.0–36.0)
MCV: 82.8 fL (ref 78.0–100.0)
Platelets: 263 10*3/uL (ref 150–400)
RBC: 5.23 MIL/uL (ref 4.22–5.81)
RDW: 14.8 % (ref 11.5–15.5)
WBC: 8.5 10*3/uL (ref 4.0–10.5)

## 2015-05-30 NOTE — Progress Notes (Signed)
PCP is Dr Nicholes Stairs.  Cardiologist is Dr Percival Spanish. Patient denies chest pain. I called Dr Jana Half and spoke with Ivin Booty and asked about Aspirin (patienty takes at night), Ivin Booty is going to speak with Dr Constance Holster and call patient.

## 2015-05-30 NOTE — Pre-Procedure Instructions (Signed)
    [name]  05/30/2015      Your procedure is scheduled on Tuesday, June 28.  Report to [facility] Admitting at 10:00 A.M.   Call this number if you have problems the morning of surgery:[phone]    Remember:  Do not eat food or drink liquids after midnight.  Take these medicines the morning of surgery with A SIP OF WATER :  Take if needed: nitroGLYCERIN (NITROSTAT), omeprazole (PRILOSEC).   Do not wear jewelry, make-up or nail polish.  Do not wear lotions, powders, or perfumes.     Men may shave face and neck.  Do not bring valuables to the hospital.  [facility] is not responsible for any belongings or valuables.  Contacts, dentures or bridgework may not be worn into surgery.  Leave your suitcase in the car.  After surgery it may be brought to your room.  For patients admitted to the hospital, discharge time will be determined by your treatment team.  Patients discharged the day of surgery will not be allowed to drive home.   Name and phone number of your driver: -  Special instructions: Review  [place] - Preparing For Surgery.  Please read over the following fact sheets that you were given. Pain Booklet, Coughing and Deep Breathing and Surgical Site Infection Prevention

## 2015-05-31 ENCOUNTER — Encounter (HOSPITAL_COMMUNITY): Payer: Self-pay | Admitting: Certified Registered Nurse Anesthetist

## 2015-05-31 ENCOUNTER — Ambulatory Visit (HOSPITAL_COMMUNITY)
Admission: RE | Admit: 2015-05-31 | Discharge: 2015-05-31 | Disposition: A | Discharge status: Home / Self Care | Payer: Medicare Other | Source: Ambulatory Visit | Attending: Otolaryngology | Admitting: Otolaryngology

## 2015-05-31 ENCOUNTER — Ambulatory Visit (HOSPITAL_COMMUNITY): Payer: Medicare Other | Admitting: Certified Registered Nurse Anesthetist

## 2015-05-31 ENCOUNTER — Encounter (HOSPITAL_COMMUNITY)
Admission: RE | Disposition: A | Discharge status: Home / Self Care | Payer: Self-pay | Source: Ambulatory Visit | Attending: Otolaryngology

## 2015-05-31 DIAGNOSIS — I252 Old myocardial infarction: Secondary | ICD-10-CM | POA: Insufficient documentation

## 2015-05-31 DIAGNOSIS — Z823 Family history of stroke: Secondary | ICD-10-CM | POA: Insufficient documentation

## 2015-05-31 DIAGNOSIS — J029 Acute pharyngitis, unspecified: Secondary | ICD-10-CM | POA: Insufficient documentation

## 2015-05-31 DIAGNOSIS — K219 Gastro-esophageal reflux disease without esophagitis: Secondary | ICD-10-CM | POA: Insufficient documentation

## 2015-05-31 DIAGNOSIS — Z7982 Long term (current) use of aspirin: Secondary | ICD-10-CM | POA: Insufficient documentation

## 2015-05-31 DIAGNOSIS — K228 Other specified diseases of esophagus: Secondary | ICD-10-CM | POA: Diagnosis not present

## 2015-05-31 DIAGNOSIS — Z8042 Family history of malignant neoplasm of prostate: Secondary | ICD-10-CM | POA: Insufficient documentation

## 2015-05-31 DIAGNOSIS — Z8249 Family history of ischemic heart disease and other diseases of the circulatory system: Secondary | ICD-10-CM | POA: Insufficient documentation

## 2015-05-31 DIAGNOSIS — J387 Other diseases of larynx: Secondary | ICD-10-CM | POA: Diagnosis not present

## 2015-05-31 DIAGNOSIS — R0602 Shortness of breath: Secondary | ICD-10-CM | POA: Diagnosis not present

## 2015-05-31 DIAGNOSIS — I2510 Atherosclerotic heart disease of native coronary artery without angina pectoris: Secondary | ICD-10-CM | POA: Diagnosis not present

## 2015-05-31 DIAGNOSIS — R1314 Dysphagia, pharyngoesophageal phase: Secondary | ICD-10-CM | POA: Diagnosis not present

## 2015-05-31 DIAGNOSIS — Z79899 Other long term (current) drug therapy: Secondary | ICD-10-CM | POA: Insufficient documentation

## 2015-05-31 DIAGNOSIS — Z85820 Personal history of malignant melanoma of skin: Secondary | ICD-10-CM | POA: Insufficient documentation

## 2015-05-31 HISTORY — PX: ESOPHAGOSCOPY: SHX5534

## 2015-05-31 LAB — CBC
HCT: 41.3 % (ref 39.0–52.0)
Hemoglobin: 13.3 g/dL (ref 13.0–17.0)
MCH: 26.6 pg (ref 26.0–34.0)
MCHC: 32.2 g/dL (ref 30.0–36.0)
MCV: 82.6 fL (ref 78.0–100.0)
Platelets: 242 10*3/uL (ref 150–400)
RBC: 5 MIL/uL (ref 4.22–5.81)
RDW: 14.8 % (ref 11.5–15.5)
WBC: 7.3 10*3/uL (ref 4.0–10.5)

## 2015-05-31 SURGERY — ESOPHAGOSCOPY
Anesthesia: General | Site: Esophagus

## 2015-05-31 MED ORDER — ONDANSETRON HCL 4 MG/2ML IJ SOLN
INTRAMUSCULAR | Status: AC
  Filled 2015-05-31: qty 2

## 2015-05-31 MED ORDER — PROPOFOL 10 MG/ML IV BOLUS
INTRAVENOUS | Status: DC | PRN
  Administered 2015-05-31: 150 mg via INTRAVENOUS

## 2015-05-31 MED ORDER — PROPOFOL 10 MG/ML IV BOLUS
INTRAVENOUS | Status: AC
  Filled 2015-05-31: qty 20

## 2015-05-31 MED ORDER — EPHEDRINE SULFATE 50 MG/ML IJ SOLN
INTRAMUSCULAR | Status: AC
  Filled 2015-05-31: qty 2

## 2015-05-31 MED ORDER — ARTIFICIAL TEARS OP OINT
TOPICAL_OINTMENT | OPHTHALMIC | Status: DC | PRN
  Administered 2015-05-31: 1 via OPHTHALMIC

## 2015-05-31 MED ORDER — LIDOCAINE-EPINEPHRINE 1 %-1:100000 IJ SOLN
INTRAMUSCULAR | Status: AC
  Filled 2015-05-31: qty 1

## 2015-05-31 MED ORDER — FENTANYL CITRATE (PF) 100 MCG/2ML IJ SOLN: 25.0000 ug | INTRAMUSCULAR | Status: DC | PRN

## 2015-05-31 MED ORDER — NEOSTIGMINE METHYLSULFATE 10 MG/10ML IV SOLN
INTRAVENOUS | Status: AC
  Filled 2015-05-31: qty 1

## 2015-05-31 MED ORDER — DEXAMETHASONE SODIUM PHOSPHATE 4 MG/ML IJ SOLN
INTRAMUSCULAR | Status: AC
  Filled 2015-05-31: qty 1

## 2015-05-31 MED ORDER — LIDOCAINE HCL (CARDIAC) 20 MG/ML IV SOLN
INTRAVENOUS | Status: AC
  Filled 2015-05-31: qty 5

## 2015-05-31 MED ORDER — EPINEPHRINE HCL 1 MG/ML IJ SOLN
INTRAMUSCULAR | Status: AC
  Filled 2015-05-31: qty 1

## 2015-05-31 MED ORDER — FENTANYL CITRATE (PF) 250 MCG/5ML IJ SOLN
INTRAMUSCULAR | Status: AC
  Filled 2015-05-31: qty 5

## 2015-05-31 MED ORDER — LACTATED RINGERS IV SOLN
INTRAVENOUS | Status: DC
  Administered 2015-05-31: 11:00:00 via INTRAVENOUS

## 2015-05-31 MED ORDER — ARTIFICIAL TEARS OP OINT
TOPICAL_OINTMENT | OPHTHALMIC | Status: AC
  Filled 2015-05-31: qty 3.5

## 2015-05-31 MED ORDER — MIDAZOLAM HCL 2 MG/2ML IJ SOLN
INTRAMUSCULAR | Status: AC
  Filled 2015-05-31: qty 2

## 2015-05-31 MED ORDER — MIDAZOLAM HCL 5 MG/5ML IJ SOLN
INTRAMUSCULAR | Status: DC | PRN
  Administered 2015-05-31: 2 mg via INTRAVENOUS

## 2015-05-31 MED ORDER — LIDOCAINE HCL (CARDIAC) 20 MG/ML IV SOLN
INTRAVENOUS | Status: DC | PRN
  Administered 2015-05-31: 40 mg via INTRAVENOUS

## 2015-05-31 MED ORDER — FENTANYL CITRATE (PF) 100 MCG/2ML IJ SOLN
INTRAMUSCULAR | Status: DC | PRN
  Administered 2015-05-31: 100 ug via INTRAVENOUS

## 2015-05-31 MED ORDER — ROCURONIUM BROMIDE 50 MG/5ML IV SOLN
INTRAVENOUS | Status: AC
  Filled 2015-05-31: qty 1

## 2015-05-31 MED ORDER — ONDANSETRON HCL 4 MG/2ML IJ SOLN
INTRAMUSCULAR | Status: DC | PRN
  Administered 2015-05-31: 4 mg via INTRAVENOUS

## 2015-05-31 MED ORDER — SUCCINYLCHOLINE CHLORIDE 20 MG/ML IJ SOLN
INTRAMUSCULAR | Status: DC | PRN
  Administered 2015-05-31: 100 mg via INTRAVENOUS

## 2015-05-31 MED ORDER — EPINEPHRINE HCL (NASAL) 0.1 % NA SOLN
NASAL | Status: AC
  Filled 2015-05-31: qty 30

## 2015-05-31 MED ORDER — GLYCOPYRROLATE 0.2 MG/ML IJ SOLN
INTRAMUSCULAR | Status: AC
  Filled 2015-05-31: qty 3

## 2015-05-31 SURGICAL SUPPLY — 24 items
BALLN PULM 15 16.5 18X75 (BALLOONS)
BALLOON PULM 15 16.5 18X75 (BALLOONS) IMPLANT
BLADE SURG 15 STRL LF DISP TIS (BLADE) IMPLANT
BLADE SURG 15 STRL SS (BLADE)
CANISTER SUCTION 2500CC (MISCELLANEOUS) ×2 IMPLANT
COVER TABLE BACK 60X90 (DRAPES) ×2 IMPLANT
DRAPE PROXIMA HALF (DRAPES) ×2 IMPLANT
GAUZE SPONGE 4X4 12PLY STRL (GAUZE/BANDAGES/DRESSINGS) ×2 IMPLANT
GAUZE SPONGE 4X4 16PLY XRAY LF (GAUZE/BANDAGES/DRESSINGS) ×2 IMPLANT
GLOVE ECLIPSE 7.5 STRL STRAW (GLOVE) ×2 IMPLANT
GUARD TEETH (MISCELLANEOUS) ×2 IMPLANT
KIT BASIN OR (CUSTOM PROCEDURE TRAY) ×2 IMPLANT
KIT ROOM TURNOVER OR (KITS) ×2 IMPLANT
MARKER SKIN DUAL TIP RULER LAB (MISCELLANEOUS) ×2 IMPLANT
NEEDLE 27GAX1X1/2 (NEEDLE) IMPLANT
NS IRRIG 1000ML POUR BTL (IV SOLUTION) ×2 IMPLANT
PAD ARMBOARD 7.5X6 YLW CONV (MISCELLANEOUS) ×4 IMPLANT
PATTIES SURGICAL .5 X3 (DISPOSABLE) IMPLANT
SPECIMEN JAR SMALL (MISCELLANEOUS) IMPLANT
SYR CONTROL 10ML LL (SYRINGE) IMPLANT
SYR TB 1ML LUER SLIP (SYRINGE) IMPLANT
TOWEL OR 17X24 6PK STRL BLUE (TOWEL DISPOSABLE) ×2 IMPLANT
TUBE CONNECTING 12X1/4 (SUCTIONS) ×2 IMPLANT
WATER STERILE IRR 1000ML POUR (IV SOLUTION) ×2 IMPLANT

## 2015-05-31 NOTE — Interval H&P Note (Signed)
History and Physical Interval Note:  05/31/2015 11:55 AM  Juan [name]  has presented today for surgery, with the diagnosis of [place]  The various methods of treatment have been discussed with the patient and family. After consideration of risks, benefits and other options for treatment, the patient has consented to  Procedure(s): ESOPHAGOSCOPY (N/A) as a surgical intervention .  The patient's history has been reviewed, patient examined, no change in status, stable for surgery.  I have reviewed the patient's chart and labs.  Questions were answered to the patient's satisfaction.     Ferrah Panagopoulos

## 2015-05-31 NOTE — Op Note (Signed)
OPERATIVE REPORT  DATE OF SURGERY: 05/31/2015  PATIENT:  Juan [name],  64 y.o. male  PRE-OPERATIVE DIAGNOSIS:  MUCOSAL IRREGULAR UPPER ESOPHAGOS  POST-OPERATIVE DIAGNOSIS:  MUCOSAL IRREGULAR UPPER ESOPHAGOS  PROCEDURE:  Procedure(s): ESOPHAGOSCOPY  SURGEON:  Beckie Salts, MD  ASSISTANTS: none  ANESTHESIA:   General   EBL:  0 ml  DRAINS: none  LOCAL MEDICATIONS USED:  None  SPECIMEN:  none  COUNTS:  Correct  PROCEDURE DETAILS: The patient was taken to the operating room and placed on the operating table in the supine position. Following induction of general endotracheal anesthesia, the table was turned 90 and the patient was draped in a standard fashion. A maxillary tooth guard was used throughout the case. A rigid cervical esophagoscope was entered into the oral cavity and passed through the cricopharyngeus and advanced down the cervical esophagus as far as it would travel. The scope was slowly withdrawn while suctioning secretions and circumferentially excised inspecting the mucosa. There were no mucosal abnormalities identified. There was no strictures. There was no pathology. The scope was removed. The patient was awakened extubated and transferred to recovery in stable condition.    PATIENT DISPOSITION:  To PACU, stable

## 2015-05-31 NOTE — Anesthesia Procedure Notes (Signed)
Procedure Name: Intubation Date/Time: 05/31/2015 12:08 PM Performed by: Willeen Cass P Pre-anesthesia Checklist: Patient identified, Emergency Drugs available, Suction available, Patient being monitored and Timeout performed Patient Re-evaluated:Patient Re-evaluated prior to inductionOxygen Delivery Method: Circle system utilized Preoxygenation: Pre-oxygenation with 100% oxygen Intubation Type: IV induction Ventilation: Mask ventilation without difficulty Laryngoscope Size: Mac and 3 Grade View: Grade IV Tube type: Oral Tube size: 7.5 mm Number of attempts: 1 (DL x1 with Gr IV view, bougie stylet easily passed into trachea and ETT advanced over bougie. ) Airway Equipment and Method: Bougie stylet Placement Confirmation: positive ETCO2 and breath sounds checked- equal and bilateral Secured at: 23 cm Tube secured with: Tape Dental Injury: Teeth and Oropharynx as per pre-operative assessment  Difficulty Due To: Difficulty was anticipated, Difficult Airway- due to anterior larynx and Difficult Airway- due to immobile epiglottis Future Recommendations: Recommend- induction with short-acting agent, and alternative techniques readily available

## 2015-05-31 NOTE — Anesthesia Preprocedure Evaluation (Signed)
Anesthesia Evaluation  Patient identified by MRN, date of birth, ID band Patient awake    Reviewed: Allergy & Precautions, H&P , NPO status , Patient's Chart, lab work & pertinent test results  History of Anesthesia Complications (+) PONV  Airway Mallampati: III  TM Distance: >3 FB Neck ROM: Full    Dental no notable dental hx. (+) Teeth Intact, Dental Advisory Given   Pulmonary neg pulmonary ROS,  breath sounds clear to auscultation  Pulmonary exam normal       Cardiovascular hypertension, Pt. on medications + CAD, + Past MI and + Cardiac Stents Rhythm:Regular Rate:Normal     Neuro/Psych negative neurological ROS  negative psych ROS   GI/Hepatic Neg liver ROS, GERD-  Medicated and Controlled,  Endo/Other  negative endocrine ROS  Renal/GU negative Renal ROS  negative genitourinary   Musculoskeletal  (+) Arthritis -, Osteoarthritis,    Abdominal   Peds  Hematology negative hematology ROS (+)   Anesthesia Other Findings   Reproductive/Obstetrics negative OB ROS                             Anesthesia Physical Anesthesia Plan  ASA: III  Anesthesia Plan: General   Post-op Pain Management:    Induction: Intravenous  Airway Management Planned: Oral ETT  Additional Equipment:   Intra-op Plan:   Post-operative Plan: Extubation in OR  Informed Consent: I have reviewed the patients History and Physical, chart, labs and discussed the procedure including the risks, benefits and alternatives for the proposed anesthesia with the patient or authorized representative who has indicated his/her understanding and acceptance.   Dental advisory given  Plan Discussed with: CRNA  Anesthesia Plan Comments:         Anesthesia Quick Evaluation

## 2015-05-31 NOTE — Transfer of Care (Signed)
Immediate Anesthesia Transfer of Care Note  Patient: Juan [name]  Procedure(s) Performed: Procedure(s): ESOPHAGOSCOPY (N/A)  Patient Location: PACU  Anesthesia Type:General  Level of Consciousness: awake, alert , oriented and patient cooperative  Airway & Oxygen Therapy: Patient Spontanous Breathing and Patient connected to nasal cannula oxygen  Post-op Assessment: Report given to RN, Post -op Vital signs reviewed and stable and Patient moving all extremities  Post vital signs: Reviewed and stable  Last Vitals:  Filed Vitals:   05/31/15 1020  BP: 139/75  Pulse: 71  Temp: 36.6 C  Resp: 20    Complications: No apparent anesthesia complications

## 2015-05-31 NOTE — Interval H&P Note (Signed)
History and Physical Interval Note:  05/31/2015 11:56 AM  Juan [name]  has presented today for surgery, with the diagnosis of [place]  The various methods of treatment have been discussed with the patient and family. After consideration of risks, benefits and other options for treatment, the patient has consented to  Procedure(s): ESOPHAGOSCOPY (N/A) as a surgical intervention .  The patient's history has been reviewed, patient examined, no change in status, stable for surgery.  I have reviewed the patient's chart and labs.  Questions were answered to the patient's satisfaction.     [name]

## 2015-05-31 NOTE — Discharge Instructions (Signed)
Resume normal diet as tolerated.

## 2015-05-31 NOTE — H&P (View-Only) (Signed)
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 Pharyngoesophageal dysphagia (787.24) (R13.14). Sore throat (462) (J02.9). Orders  Barium Swallow; Requested for: 17 May 2015. Discussed  All the symptoms can be related to chronic reflux. He may have a stricture or hypertrophy of the cricopharyngeus. The barium swallow should answer these questions. In the meantime, recommend complete caffeine avoidance. We may refer him to GI depending on what the barium swallow shows. Reason For Visit  Juan [name] is here today at the kind request of Webb Silversmith for consultation and opinion for sore throat. HPI  Several month history of a slight soreness in his throat, intermittent stickiness and a tickle especially if he uses his voice. For about 10 years, he occasionally has food stuck in his throat and he has to regurgitate it. He has had intermittent heartburn over the years. He is taken amoxicillin in the past. He drinks about 3 cups of ice tea daily. Allergies  No Known Drug Allergies. Current Meds  Aspirin EC 81 MG Oral Tablet Delayed Release;; RPT Altace CAPS (Ramipril);; RPT Statins;; RPT. Active Problems  Blockage of coronary artery of heart   (410.90) (I21.3). PMH  History of malignant melanoma (V10.82) (Z02.585) History of myocardial infarction (412) (I25.2). PSH  Oral Surgery Tooth Extraction. Family Hx  Family history of heart disease: Father (V51.49) (Z69.49) Family history of malignant neoplasm of prostate: Brother (I77.82) (Z36.42) Family history of stroke: Mother (V17.1) (Z82.3). Personal Hx  Never smoker No caffeine use Non-smoker (V49.89) (Z78.9) Social alcohol use (Z78.9). ROS  Systemic: Not feeling tired (fatigue).  No fever, no night sweats, and no recent weight loss. Head: No headache. Eyes: No eye symptoms. Otolaryngeal: No hearing loss, no earache, no tinnitus, and no purulent nasal discharge.  No nasal passage blockage (stuffiness), no snoring, and no  sneezing.  Hoarseness  and sore throat. Cardiovascular: No chest pain or discomfort  and no palpitations. Pulmonary: No dyspnea, no cough, and no wheezing. Gastrointestinal: Dysphagia.  No heartburn.  No nausea, no abdominal pain, and no melena.  No diarrhea. Genitourinary: No dysuria. Endocrine: No muscle weakness. Musculoskeletal: No calf muscle cramps, no arthralgias, and no soft tissue swelling. Neurological: No dizziness, no fainting, no tingling, and no numbness. Psychological: No anxiety  and no depression. Skin: No rash. 12 system ROS was obtained and reviewed on the Health Maintenance form dated today.  Positive responses are shown above.  If the symptom is not checked, the patient has denied it. Vital Signs   Recorded by Iran Sizer on 17 May 2015 01:13 PM BP:104/70,  BSA Calculated: 2.10 ,  BMI Calculated: 27.48 ,  Weight: 197 lb , BMI: 27.5 kg/m2,  Height: 5 ft 11 in. Physical Exam  APPEARANCE: Well developed, well nourished, in no acute distress.  Normal affect, in a pleasant mood.  Oriented to time, place and person. COMMUNICATION: Normal voice   HEAD & FACE:  No scars, lesions or masses of head and face.  Sinuses nontender to palpation.  Salivary glands without mass or tenderness.  Facial strength symmetric.  No facial lesion, scars, or mass. EYES: EOMI with normal primary gaze alignment. Visual acuity grossly intact.  PERRLA EXTERNAL EAR & NOSE: No scars, lesions or masses  EAC & TYMPANIC MEMBRANE:  EAC shows no obstructing lesions or debris and tympanic membranes are normal bilaterally with good movement to insufflation. GROSS HEARING: Normal  TMJ:  Nontender  INTRANASAL EXAM: No polyps or purulence.  NASOPHARYNX: Normal, without lesions. LIPS, TEETH & GUMS: No lip lesions,  normal dentition and normal gums. ORAL CAVITY/OROPHARYNX:  Oral mucosa moist without lesion or asymmetry of the palate, tongue, tonsil or posterior pharynx. LARYNX (mirror exam):  No lesions  of the epiglottis, false cord or TVC's and cords move well to phonation. HYPOPHARYNX (mirror exam): No lesions, asymmetry or pooling of secretions. NECK:  Supple without adenopathy or mass. THYROID:  Normal with no masses palpable.  NEUROLOGIC:  No gross CN deficits. No nystagmus noted.   LYMPHATIC:  No enlarged nodes palpable. Signature  Electronically signed by : Izora Gala  M.D.; 05/17/2015 1:30 PM EST.

## 2015-06-01 ENCOUNTER — Encounter (HOSPITAL_COMMUNITY): Payer: Self-pay | Admitting: Otolaryngology

## 2015-06-01 NOTE — Anesthesia Postprocedure Evaluation (Signed)
  Anesthesia Post-op Note  Patient: Juan [name]  Procedure(s) Performed: Procedure(s): ESOPHAGOSCOPY (N/A)  Patient Location: PACU  Anesthesia Type:General  Level of Consciousness: awake and alert   Airway and Oxygen Therapy: Patient Spontanous Breathing  Post-op Pain: none  Post-op Assessment: Post-op Vital signs reviewed, Patient's Cardiovascular Status Stable and Respiratory Function Stable  Post-op Vital Signs: Reviewed  Filed Vitals:   05/31/15 1336  BP:   Pulse: 62  Temp:   Resp: 18    Complications: No apparent anesthesia complications

## 2015-06-07 ENCOUNTER — Telehealth: Payer: Self-pay | Admitting: Internal Medicine

## 2015-06-07 NOTE — Telephone Encounter (Signed)
Pt has appt on 06/09/15 with Webb Silversmith NP per pts request.

## 2015-06-07 NOTE — Telephone Encounter (Signed)
[facility]  Patient Name: Juan [name]  DOB: 09-Jul-1951    Initial Comment Caller says he has always been regular, but the last 6 weeks he has had 2, 3 BMs daily and wants to know if he should be checked out    Nurse Assessment  Nurse: Wynetta Emery, RN, Baker Janus Date/Time Eilene Ghazi Time): 06/07/2015 4:09:57 PM  Confirm and document reason for call. If symptomatic, describe symptoms. ---[name] has had for last 6 weeks 2 to 3 loose stools a day compared to normal one a day. No changes in diet, same time the throat problem started.  Has the patient traveled out of the country within the last 30 days? ---No  Does the patient require triage? ---Yes  Related visit to physician within the last 2 weeks? ---No  Does the PT have any chronic conditions? (i.e. diabetes, asthma, etc.) ---Unknown     Guidelines    Guideline Title Affirmed Question Affirmed Notes  Diarrhea Diarrhea present > 7 days    Final Disposition User   See PCP When Office is Open (within 3 days) Wynetta Emery, RN, Baker Janus    Comments  06-09-2015 915am Baity NP (he states he saw her last and would like to see her this time)

## 2015-06-09 ENCOUNTER — Encounter: Payer: Self-pay | Admitting: Internal Medicine

## 2015-06-09 ENCOUNTER — Ambulatory Visit (INDEPENDENT_AMBULATORY_CARE_PROVIDER_SITE_OTHER): Payer: Medicare Other | Admitting: Internal Medicine

## 2015-06-09 VITALS — BP 120/70 | HR 81 | Temp 97.8°F | Wt 198.0 lb

## 2015-06-09 DIAGNOSIS — R194 Change in bowel habit: Secondary | ICD-10-CM

## 2015-06-09 DIAGNOSIS — R195 Other fecal abnormalities: Secondary | ICD-10-CM | POA: Diagnosis not present

## 2015-06-09 DIAGNOSIS — I2510 Atherosclerotic heart disease of native coronary artery without angina pectoris: Secondary | ICD-10-CM

## 2015-06-09 NOTE — Patient Instructions (Signed)

## 2015-06-09 NOTE — Progress Notes (Signed)
Pre visit review using our clinic review tool, if applicable. No additional management support is needed unless otherwise documented below in the visit note. 

## 2015-06-09 NOTE — Progress Notes (Addendum)
Subjective:    Patient ID: Juan [name], male    DOB: 1951/01/31, 64 y.o.   MRN: [id number]  HPI  Pt presents to the clinic today with c/o a change in his bowel habits. He reports this started about 6 weeks ago. He is having 2-3 loose stools per day. He denies abdominal pain, nausea, reflux or vomiting. He is taking Prilosec 20 mg daily but has not noticed any improvement. He denies any changes in his diet. He has not traveled out of the country, drank contaminated water or been on antibiotics in the last 6 weeks. He has not come in contact with anyone that has similar symptoms that he is aware of. He is concerned that this may be a result from numerous tick bites over the last 3 months. He has not noticed any rashes, fatigue, fever, rash or joint pains. He did have a colonoscopy in 2014 which was normal.  Review of Systems       Past Medical History  Diagnosis Date  . Diverticulosis of colon   . CAD (coronary artery disease)     a. RCA occlusion with 2 drug-eluting stents 2004;  b. 90% stenosis distal to the stents in the same artery treated with drug-eluting stenting February 2012;  .c 12/2013 Cath: nonobs dzs.  . Melanoma   . Hypertension   . Hyperlipidemia   . GERD (gastroesophageal reflux disease)   . Myocardial infarction 2004  . Complication of anesthesia   . PONV (postoperative nausea and vomiting)     Age 72- not since  . Shortness of breath dyspnea     with extertion    Current Outpatient Prescriptions  Medication Sig Dispense Refill  . aspirin EC 81 MG tablet Take 81 mg by mouth daily.    Marland Kitchen atorvastatin (LIPITOR) 20 MG tablet Take 1 tablet (20 mg total) by mouth daily. 90 tablet 0  . nitroGLYCERIN (NITROSTAT) 0.4 MG SL tablet Place 1 tablet (0.4 mg total) under the tongue every 5 (five) minutes as needed. 25 tablet 6  . omeprazole (PRILOSEC) 20 MG capsule Take 20 mg by mouth daily as needed.     . ramipril (ALTACE) 2.5 MG capsule Take 1 capsule (2.5 mg total) by mouth  daily. 90 capsule 0  . tretinoin (RETIN-A) 0.025 % cream Apply 1 application topically daily.     No current facility-administered medications for this visit.    No Known Allergies  Family History  Problem Relation Age of Onset  . Heart attack Father 28    died  . Stroke Mother   . Hypertension Mother   . Colon cancer Neg Hx   . Pancreatic cancer Neg Hx   . Stomach cancer Neg Hx   . Esophageal cancer Neg Hx   . Rectal cancer Neg Hx   . Diabetes Neg Hx   . Hypertension Brother   . Cancer Brother     prostate    History   Social History  . Marital Status: Married    Spouse Name: N/A  . Number of Children: 1  . 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 Smoking status: Never Smoker   . Smokeless tobacco: Never Used  . Alcohol Use: 0.6 oz/week    1 Cans of beer per week  . Drug Use: No  . Sexual Activity: Not on file   Other Topics Concern  .  Not on file   Social History Narrative   No living will or health care POA   Would accept resuscitation attempts        Constitutional: Denies fever, malaise, fatigue, headache or abrupt weight changes.  Respiratory: Denies difficulty breathing, shortness of breath, cough or sputum production.   Cardiovascular: Denies chest pain, chest tightness, palpitations or swelling in the hands or feet.  Gastrointestinal: Pt reports loose stool. Denies abdominal pain, bloating, constipation, or blood in the stool.    No other specific complaints in a complete review of systems (except as listed in HPI above).  Objective:   Physical Exam   BP 120/70 mmHg  Pulse 81  Temp(Src) 97.8 F (36.6 C) (Oral)  Wt 198 lb (89.812 kg)  SpO2 98% Wt Readings from Last 3 Encounters:  06/09/15 198 lb (89.812 kg)  05/31/15 198 lb (89.812 kg)  05/30/15 198 lb 6.4 oz (89.994 kg)    General: Appears his stated age, well developed, well  nourished in NAD. Cardiovascular: Normal rate and rhythm. S1,S2 noted.  No murmur, rubs or gallops noted.  Pulmonary/Chest: Normal effort and positive vesicular breath sounds. No respiratory distress. No wheezes, rales or ronchi noted.  Abdomen: Soft and nontender. Normal bowel sounds, no bruits noted. No distention or masses noted. Liver, spleen and kidneys non palpable. Neurological: Alert and oriented.   BMET    Component Value Date/Time   NA 139 05/30/2015 1458   K 3.9 05/30/2015 1458   CL 102 05/30/2015 1458   CO2 30 05/30/2015 1458   GLUCOSE 133* 05/30/2015 1458   BUN 8 05/30/2015 1458   CREATININE 1.09 05/30/2015 1458   CALCIUM 8.8* 05/30/2015 1458   GFRNONAA >60 05/30/2015 1458   GFRAA >60 05/30/2015 1458    Lipid Panel     Component Value Date/Time   CHOL 145 12/22/2014 1335   TRIG 103.0 12/22/2014 1335   HDL 44.30 12/22/2014 1335   CHOLHDL 3 12/22/2014 1335   VLDL 20.6 12/22/2014 1335   LDLCALC 80 12/22/2014 1335    CBC    Component Value Date/Time   WBC 7.3 05/31/2015 1102   RBC 5.00 05/31/2015 1102   HGB 13.3 05/31/2015 1102   HCT 41.3 05/31/2015 1102   PLT 242 05/31/2015 1102   MCV 82.6 05/31/2015 1102   MCH 26.6 05/31/2015 1102   MCHC 32.2 05/31/2015 1102   RDW 14.8 05/31/2015 1102   LYMPHSABS 2.2 12/22/2014 1335   MONOABS 0.8 12/22/2014 1335   EOSABS 0.2 12/22/2014 1335   BASOSABS 0.0 12/22/2014 1335    Hgb A1C No results found for: HGBA1C      Assessment & Plan:   Changes in bowel habits:  Does not appear to be infectious Will check for tick born illness- go to lab today to have blood drawn Advised him to start taking a fiber supplement or probiotic OTC If symptoms persist, we can obtain stool studies versus referral to GI  RTC as needed or if symptoms persist or worsen

## 2015-06-10 LAB — B. BURGDORFI ANTIBODIES: B burgdorferi Ab IgG+IgM: 0.21 {ISR}

## 2015-06-13 LAB — ROCKY MTN SPOTTED FVR AB, IGG-BLOOD: RMSF IgG: 0.63 IV

## 2015-06-22 DIAGNOSIS — L821 Other seborrheic keratosis: Secondary | ICD-10-CM | POA: Diagnosis not present

## 2015-06-22 DIAGNOSIS — D045 Carcinoma in situ of skin of trunk: Secondary | ICD-10-CM | POA: Diagnosis not present

## 2015-06-22 DIAGNOSIS — C44519 Basal cell carcinoma of skin of other part of trunk: Secondary | ICD-10-CM | POA: Diagnosis not present

## 2015-06-22 DIAGNOSIS — D225 Melanocytic nevi of trunk: Secondary | ICD-10-CM | POA: Diagnosis not present

## 2015-06-22 DIAGNOSIS — D2271 Melanocytic nevi of right lower limb, including hip: Secondary | ICD-10-CM | POA: Diagnosis not present

## 2015-06-22 DIAGNOSIS — C44619 Basal cell carcinoma of skin of left upper limb, including shoulder: Secondary | ICD-10-CM | POA: Diagnosis not present

## 2015-06-22 DIAGNOSIS — L111 Transient acantholytic dermatosis [Grover]: Secondary | ICD-10-CM | POA: Diagnosis not present

## 2015-06-22 DIAGNOSIS — D485 Neoplasm of uncertain behavior of skin: Secondary | ICD-10-CM | POA: Diagnosis not present

## 2015-06-22 DIAGNOSIS — D2272 Melanocytic nevi of left lower limb, including hip: Secondary | ICD-10-CM | POA: Diagnosis not present

## 2015-06-22 DIAGNOSIS — L570 Actinic keratosis: Secondary | ICD-10-CM | POA: Diagnosis not present

## 2015-06-22 DIAGNOSIS — Z85828 Personal history of other malignant neoplasm of skin: Secondary | ICD-10-CM | POA: Diagnosis not present

## 2015-06-22 DIAGNOSIS — B351 Tinea unguium: Secondary | ICD-10-CM | POA: Diagnosis not present

## 2015-07-19 DIAGNOSIS — Z85828 Personal history of other malignant neoplasm of skin: Secondary | ICD-10-CM | POA: Diagnosis not present

## 2015-07-19 DIAGNOSIS — D045 Carcinoma in situ of skin of trunk: Secondary | ICD-10-CM | POA: Diagnosis not present

## 2015-07-19 DIAGNOSIS — C44519 Basal cell carcinoma of skin of other part of trunk: Secondary | ICD-10-CM | POA: Diagnosis not present

## 2015-07-19 DIAGNOSIS — C44619 Basal cell carcinoma of skin of left upper limb, including shoulder: Secondary | ICD-10-CM | POA: Diagnosis not present

## 2015-09-28 ENCOUNTER — Encounter: Payer: Self-pay | Admitting: Gastroenterology

## 2015-11-15 ENCOUNTER — Other Ambulatory Visit: Payer: Self-pay | Admitting: Cardiology

## 2015-11-15 NOTE — Telephone Encounter (Signed)
Rx request sent to pharmacy.  

## 2015-11-16 ENCOUNTER — Other Ambulatory Visit: Payer: Self-pay | Admitting: Cardiology

## 2015-11-17 NOTE — Telephone Encounter (Signed)
Rx(s) sent to pharmacy electronically.  

## 2015-12-22 DIAGNOSIS — L910 Hypertrophic scar: Secondary | ICD-10-CM | POA: Diagnosis not present

## 2015-12-22 DIAGNOSIS — B351 Tinea unguium: Secondary | ICD-10-CM | POA: Diagnosis not present

## 2015-12-22 DIAGNOSIS — L821 Other seborrheic keratosis: Secondary | ICD-10-CM | POA: Diagnosis not present

## 2015-12-22 DIAGNOSIS — L570 Actinic keratosis: Secondary | ICD-10-CM | POA: Diagnosis not present

## 2015-12-22 DIAGNOSIS — D485 Neoplasm of uncertain behavior of skin: Secondary | ICD-10-CM | POA: Diagnosis not present

## 2015-12-22 DIAGNOSIS — D2272 Melanocytic nevi of left lower limb, including hip: Secondary | ICD-10-CM | POA: Diagnosis not present

## 2015-12-22 DIAGNOSIS — D2211 Melanocytic nevi of right eyelid, including canthus: Secondary | ICD-10-CM | POA: Diagnosis not present

## 2015-12-22 DIAGNOSIS — L111 Transient acantholytic dermatosis [Grover]: Secondary | ICD-10-CM | POA: Diagnosis not present

## 2015-12-22 DIAGNOSIS — Z85828 Personal history of other malignant neoplasm of skin: Secondary | ICD-10-CM | POA: Diagnosis not present

## 2015-12-22 DIAGNOSIS — D2271 Melanocytic nevi of right lower limb, including hip: Secondary | ICD-10-CM | POA: Diagnosis not present

## 2015-12-22 DIAGNOSIS — C4441 Basal cell carcinoma of skin of scalp and neck: Secondary | ICD-10-CM | POA: Diagnosis not present

## 2015-12-23 ENCOUNTER — Other Ambulatory Visit: Payer: Medicare Other

## 2015-12-23 DIAGNOSIS — H43813 Vitreous degeneration, bilateral: Secondary | ICD-10-CM | POA: Diagnosis not present

## 2015-12-23 DIAGNOSIS — H25813 Combined forms of age-related cataract, bilateral: Secondary | ICD-10-CM | POA: Diagnosis not present

## 2015-12-23 DIAGNOSIS — H01001 Unspecified blepharitis right upper eyelid: Secondary | ICD-10-CM | POA: Diagnosis not present

## 2015-12-23 DIAGNOSIS — H52203 Unspecified astigmatism, bilateral: Secondary | ICD-10-CM | POA: Diagnosis not present

## 2015-12-28 ENCOUNTER — Encounter: Payer: Self-pay | Admitting: Internal Medicine

## 2015-12-28 ENCOUNTER — Ambulatory Visit (INDEPENDENT_AMBULATORY_CARE_PROVIDER_SITE_OTHER): Payer: Medicare Other | Admitting: Internal Medicine

## 2015-12-28 VITALS — BP 120/80 | HR 67 | Temp 98.3°F | Ht 71.0 in | Wt 200.0 lb

## 2015-12-28 DIAGNOSIS — E785 Hyperlipidemia, unspecified: Secondary | ICD-10-CM

## 2015-12-28 DIAGNOSIS — I2510 Atherosclerotic heart disease of native coronary artery without angina pectoris: Secondary | ICD-10-CM

## 2015-12-28 DIAGNOSIS — Z7189 Other specified counseling: Secondary | ICD-10-CM

## 2015-12-28 DIAGNOSIS — I10 Essential (primary) hypertension: Secondary | ICD-10-CM

## 2015-12-28 DIAGNOSIS — N400 Enlarged prostate without lower urinary tract symptoms: Secondary | ICD-10-CM | POA: Diagnosis not present

## 2015-12-28 DIAGNOSIS — Z0000 Encounter for general adult medical examination without abnormal findings: Secondary | ICD-10-CM

## 2015-12-28 DIAGNOSIS — Z23 Encounter for immunization: Secondary | ICD-10-CM | POA: Diagnosis not present

## 2015-12-28 LAB — CBC WITH DIFFERENTIAL/PLATELET
BASOS ABS: 0 10*3/uL (ref 0.0–0.1)
Basophils Relative: 0.5 % (ref 0.0–3.0)
EOS ABS: 0.2 10*3/uL (ref 0.0–0.7)
Eosinophils Relative: 1.8 % (ref 0.0–5.0)
HCT: 49.6 % (ref 39.0–52.0)
HEMOGLOBIN: 15.7 g/dL (ref 13.0–17.0)
Lymphocytes Relative: 27.2 % (ref 12.0–46.0)
Lymphs Abs: 2.5 10*3/uL (ref 0.7–4.0)
MCHC: 31.7 g/dL (ref 30.0–36.0)
MCV: 82.3 fl (ref 78.0–100.0)
MONO ABS: 0.8 10*3/uL (ref 0.1–1.0)
Monocytes Relative: 9 % (ref 3.0–12.0)
Neutro Abs: 5.5 10*3/uL (ref 1.4–7.7)
Neutrophils Relative %: 61.5 % (ref 43.0–77.0)
Platelets: 291 10*3/uL (ref 150.0–400.0)
RBC: 6.03 Mil/uL — ABNORMAL HIGH (ref 4.22–5.81)
RDW: 15 % (ref 11.5–15.5)
WBC: 9 10*3/uL (ref 4.0–10.5)

## 2015-12-28 LAB — COMPREHENSIVE METABOLIC PANEL
ALBUMIN: 4.1 g/dL (ref 3.5–5.2)
ALT: 20 U/L (ref 0–53)
AST: 21 U/L (ref 0–37)
Alkaline Phosphatase: 83 U/L (ref 39–117)
BILIRUBIN TOTAL: 0.7 mg/dL (ref 0.2–1.2)
BUN: 12 mg/dL (ref 6–23)
CALCIUM: 9.3 mg/dL (ref 8.4–10.5)
CHLORIDE: 104 meq/L (ref 96–112)
CO2: 32 mEq/L (ref 19–32)
CREATININE: 1.03 mg/dL (ref 0.40–1.50)
GFR: 76.68 mL/min (ref >60.00)
Glucose, Bld: 96 mg/dL (ref 70–99)
Potassium: 4.2 mEq/L (ref 3.5–5.1)
SODIUM: 140 meq/L (ref 135–145)
TOTAL PROTEIN: 7.3 g/dL (ref 6.0–8.3)

## 2015-12-28 LAB — LIPID PANEL
Cholesterol: 127 mg/dL (ref 0–200)
HDL: 43.9 mg/dL (ref >39.00)
LDL CALC: 58 mg/dL (ref 0–99)
NonHDL: 83.4
TRIGLYCERIDES: 125 mg/dL (ref 0.0–149.0)
Total CHOL/HDL Ratio: 3
VLDL: 25 mg/dL (ref 0.0–40.0)

## 2015-12-28 MED ORDER — ZOSTER VACCINE LIVE 19400 UNT/0.65ML ~~LOC~~ SOLR: 0.6500 mL | Freq: Once | SUBCUTANEOUS | Status: DC

## 2015-12-28 NOTE — Assessment & Plan Note (Signed)
Mild symptoms No need for meds

## 2015-12-28 NOTE — Addendum Note (Signed)
Addended by: Emelia Salisbury C on: 12/28/2015 11:59 AM   Modules accepted: Orders

## 2015-12-28 NOTE — Assessment & Plan Note (Signed)
Past angioplasty No symptoms now On appropriate meds Sees Dr Percival Spanish

## 2015-12-28 NOTE — Assessment & Plan Note (Signed)
I have personally reviewed the Medicare Annual Wellness questionnaire and have noted 1. The patient's medical and social history 2. Their use of alcohol, tobacco or illicit drugs 3. Their current medications and supplements 4. The patient's functional ability including ADL's, fall risks, home safety risks and hearing or visual             impairment. 5. Diet and physical activities 6. Evidence for depression or mood disorders  The patients weight, height, BMI and visual acuity have been recorded in the chart I have made referrals, counseling and provided education to the patient based review of the above and I have provided the pt with a written personalized care plan for preventive services.  I have provided you with a copy of your personalized plan for preventive services. Please take the time to review along with your updated medication list.  Will defer PSA to next year  Colonoscopy due 2024 Discussed increasing exercise Will try flu vaccine-- and needs pneumovax Will send Rx for zostavax

## 2015-12-28 NOTE — Assessment & Plan Note (Signed)
See social history Blank forms given 

## 2015-12-28 NOTE — Progress Notes (Signed)
Pre visit review using our clinic review tool, if applicable. No additional management support is needed unless otherwise documented below in the visit note. 

## 2015-12-28 NOTE — Progress Notes (Signed)
Subjective:    Patient ID: Juan [name], male    DOB: [birth year], 65 y.o.   MRN: [id number]  HPI Here for Medicare wellness and follow up of chronic medical conditions Reviewed form and advanced directives Reviewed other doctors Occasional drink of alcohol No tobacco No exercise--- discussed this. 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 Vision and hearing are fine No falls No depression or anhedonia Independent with instrumental ADLs Does notice some recall problems since being on atorvastatin  Bowels are some better--- but still different than in the past Goes first thing in the AM--only occasionally a second time No blood No abdominal pain  No chest pain No SOB Will have rare orthostatic dizziness--no syncope No edema No palpitations  Has been using the omeprazole less  Almost off it now No swallowing problems Did see ENT this summer--had full work up and felt it was reflux  Current Outpatient Prescriptions on File Prior to Visit  Medication Sig Dispense Refill  . aspirin EC 81 MG tablet Take 81 mg by mouth daily.    Marland Kitchen atorvastatin (LIPITOR) 20 MG tablet TAKE 1 TABLET BY MOUTH DAILY 90 tablet 1  . omeprazole (PRILOSEC) 20 MG capsule Take 20 mg by mouth daily as needed.     . ramipril (ALTACE) 2.5 MG capsule TAKE ONE CAPSULE BY MOUTH DAILY 90 capsule 2  . tretinoin (RETIN-A) 0.025 % cream Apply 1 application topically daily.     No current facility-administered medications on file prior to visit.    No Known Allergies  Past Medical History  Diagnosis Date  . Diverticulosis of colon   . CAD (coronary artery disease)     a. RCA occlusion with 2 drug-eluting stents 2004;  b. 90% stenosis distal to the stents in the same artery treated with drug-eluting stenting February 2012;  .c 12/2013 Cath: nonobs dzs.  . Melanoma ([place])   . Hypertension   . Hyperlipidemia   . GERD (gastroesophageal reflux disease)   . Myocardial infarction ([place]) 2004  . Complication of  anesthesia   . PONV (postoperative nausea and vomiting)     Age 65- not since  . Shortness of breath dyspnea     with extertion    Past Surgical History  Procedure Laterality Date  . Ptca  2004  . Colonoscopy    . Melanoma excision      3 seperate ones  . Wisdom teeth removal    . Left heart catheterization with coronary angiogram N/A 12/14/2013    Procedure: LEFT HEART CATHETERIZATION WITH CORONARY ANGIOGRAM;  Surgeon: Sinclair Grooms, MD;  Location: [facility] CATH LAB;  Service: Cardiovascular;  Laterality: N/A;  . Esophagoscopy N/A 05/31/2015    Procedure: ESOPHAGOSCOPY;  Surgeon: Izora Gala, MD;  Location: [facility] OR;  Service: ENT;  Laterality: N/A;    Family History  Problem Relation Age of Onset  . Heart attack Father 36    died  . Stroke Mother   . Hypertension Mother   . Colon cancer Neg Hx   . Pancreatic cancer Neg Hx   . Stomach cancer Neg Hx   . Esophageal cancer Neg Hx   . Rectal cancer Neg Hx   . Diabetes Neg Hx   . Hypertension Brother   . Cancer Brother     prostate    Social History   Social History  . Marital Status: Married    Spouse Name: N/A  . Number of Children: 1  . 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 Smoking status: Never Smoker   . Smokeless tobacco: Never Used  . Alcohol Use: 0.6 oz/week    1 Cans of beer per week  . Drug Use: No  . Sexual Activity: Not on file   Other Topics Concern  . Not on file   Social History Narrative   No living will or health care POA   Would want wife to make decisions   Would accept resuscitation attempts   Would accept at least short term trial of tube feeds   Review of Systems Appetite is good Weight is stable Recent cancer on neck--sees the dermatologist regularly (Dr Ubaldo Glassing). Does have 1 mycotic toenail--just using Vick's vaporub Sleeps well Teeth are fine--regular with dentist Wears seat  belt Mild slowing of urinary stream. Stable 2/night nocturia No significant joint pain. Rare brief back pain    Objective:   Physical Exam  Constitutional: He is oriented to person, place, and time. He appears well-developed and well-nourished. No distress.  HENT:  Mouth/Throat: Oropharynx is clear and moist. No oropharyngeal exudate.  Neck: Normal range of motion. Neck supple. No thyromegaly present.  Cardiovascular: Normal rate, regular rhythm, normal heart sounds and intact distal pulses.  Exam reveals no gallop.   No murmur heard. Pulmonary/Chest: Effort normal and breath sounds normal. No respiratory distress. He has no wheezes. He has no rales.  Abdominal: Soft. There is no tenderness.  Musculoskeletal: He exhibits no edema or tenderness.  Lymphadenopathy:    He has no cervical adenopathy.  Neurological: He is alert and oriented to person, place, and time.  President-- "Lynnea Maizes Qu[place] Burow" [id number] Recall 3/3  Skin: No erythema.  Psychiatric: He has a normal mood and affect. His behavior is normal.          Assessment & Plan:

## 2015-12-28 NOTE — Assessment & Plan Note (Signed)
?  recall issues with atorvastatin No change for now

## 2015-12-28 NOTE — Assessment & Plan Note (Signed)
BP Readings from Last 3 Encounters:  12/28/15 120/80  06/09/15 120/70  05/31/15 110/65   Good control

## 2016-01-02 ENCOUNTER — Encounter: Payer: Self-pay | Admitting: *Deleted

## 2016-01-04 DIAGNOSIS — Z85828 Personal history of other malignant neoplasm of skin: Secondary | ICD-10-CM | POA: Diagnosis not present

## 2016-01-04 DIAGNOSIS — C4441 Basal cell carcinoma of skin of scalp and neck: Secondary | ICD-10-CM | POA: Diagnosis not present

## 2016-01-20 DIAGNOSIS — L570 Actinic keratosis: Secondary | ICD-10-CM | POA: Diagnosis not present

## 2016-02-20 DIAGNOSIS — L570 Actinic keratosis: Secondary | ICD-10-CM | POA: Diagnosis not present

## 2016-04-10 ENCOUNTER — Ambulatory Visit (INDEPENDENT_AMBULATORY_CARE_PROVIDER_SITE_OTHER): Payer: Medicare Other | Admitting: Family

## 2016-04-10 ENCOUNTER — Encounter: Payer: Self-pay | Admitting: Family

## 2016-04-10 VITALS — BP 110/72 | HR 94 | Temp 98.7°F | Ht 71.0 in | Wt 200.0 lb

## 2016-04-10 DIAGNOSIS — J309 Allergic rhinitis, unspecified: Secondary | ICD-10-CM | POA: Diagnosis not present

## 2016-04-10 DIAGNOSIS — I2510 Atherosclerotic heart disease of native coronary artery without angina pectoris: Secondary | ICD-10-CM | POA: Diagnosis not present

## 2016-04-10 MED ORDER — LORATADINE 10 MG PO TABS: 10.0000 mg | ORAL_TABLET | Freq: Every day | ORAL | Status: DC

## 2016-04-10 MED ORDER — PROMETHAZINE-DM 6.25-15 MG/5ML PO SYRP: 5.0000 mL | ORAL_SOLUTION | Freq: Every evening | ORAL | Status: DC | PRN

## 2016-04-10 MED ORDER — FLUTICASONE PROPIONATE 50 MCG/ACT NA SUSP: 2.0000 | Freq: Every day | NASAL | Status: DC

## 2016-04-10 NOTE — Patient Instructions (Signed)
Suspect seasonal allergies.   Increase intake of clear fluids. Congestion is best treated by hydration, when mucus is wetter, it is thinner, less sticky, and easier to expel from the body, either through coughing up drainage, or by blowing your nose.   Get plenty of rest.   Use saline nasal drops and blow your nose frequently. Run a humidifier at night and elevate the head of the bed. Vicks Vapor rub will help with congestion and cough. Steam showers and sinus massage for congestion.   Use Acetaminophen or Ibuprofen as needed for fever or pain. Avoid second hand smoke. Even the smallest exposure will worsen symptoms.   Over the counter medications you can try include Delsym for cough, a decongestant for congestion, and Mucinex or Robitussin as an expectorant. Be sure to just get the plain Mucinex or Robitussin that just has one medication (Guaifenesen). We don't recommend the combination products. Note, be sure to drink two glasses of water with each dose of Mucinex as the medication will not work well without adequate hydration.   You can also try a teaspoon of honey to see if this will help reduce cough. Throat lozenges can sometimes be beneficial as well.    This illness will typically last 7 - 10 days.   Please follow up with our clinic if you develop a fever greater than 101 F, symptoms worsen, or do not resolve in the next week.   `Allergic Rhinitis Allergic rhinitis is when the mucous membranes in the nose respond to allergens. Allergens are particles in the air that cause your body to have an allergic reaction. This causes you to release allergic antibodies. Through a chain of events, these eventually cause you to release histamine into the blood stream. Although meant to protect the body, it is this release of histamine that causes your discomfort, such as frequent sneezing, congestion, and an itchy, runny nose.  CAUSES Seasonal allergic rhinitis (hay fever) is caused by pollen allergens  that may come from grasses, trees, and weeds. Year-round allergic rhinitis (perennial allergic rhinitis) is caused by allergens such as house dust mites, pet dander, and mold spores. SYMPTOMS  Nasal stuffiness (congestion).  Itchy, runny nose with sneezing and tearing of the eyes. DIAGNOSIS Your health care provider can help you determine the allergen or allergens that trigger your symptoms. If you and your health care provider are unable to determine the allergen, skin or blood testing may be used. Your health care provider will diagnose your condition after taking your health history and performing a physical exam. Your health care provider may assess you for other related conditions, such as asthma, pink eye, or an ear infection. TREATMENT Allergic rhinitis does not have a cure, but it can be controlled by:  Medicines that block allergy symptoms. These may include allergy shots, nasal sprays, and oral antihistamines.  Avoiding the allergen. Hay fever may often be treated with antihistamines in pill or nasal spray forms. Antihistamines block the effects of histamine. There are over-the-counter medicines that may help with nasal congestion and swelling around the eyes. Check with your health care provider before taking or giving this medicine. If avoiding the allergen or the medicine prescribed do not work, there are many new medicines your health care provider can prescribe. Stronger medicine may be used if initial measures are ineffective. Desensitizing injections can be used if medicine and avoidance does not work. Desensitization is when a patient is given ongoing shots until the body becomes less sensitive to the  allergen. Make sure you follow up with your health care provider if problems continue. HOME CARE INSTRUCTIONS It is not possible to completely avoid allergens, but you can reduce your symptoms by taking steps to limit your exposure to them. It helps to know exactly what you are  allergic to so that you can avoid your specific triggers. SEEK MEDICAL CARE IF:  You have a fever.  You develop a cough that does not stop easily (persistent).  You have shortness of breath.  You start wheezing.  Symptoms interfere with normal daily activities.   This information is not intended to replace advice given to you by your health care provider. Make sure you discuss any questions you have with your health care provider.   Document Released: 08/14/2001 Document Revised: 12/10/2014 Document Reviewed: 07/27/2013 Elsevier Interactive Patient Education [employer].

## 2016-04-10 NOTE — Progress Notes (Signed)
Pre visit review using our clinic review tool, if applicable. No additional management support is needed unless otherwise documented below in the visit note. 

## 2016-04-10 NOTE — Progress Notes (Signed)
Subjective:    Patient ID: Juan [name], male    DOB: [birth year], 65 y.o.   MRN: [id number]   Juan [name] is a 65 y.o. male who presents today for an acute visit.    HPI Comments: Wife has been sick. Has been working outside a lot, in particular his barn. History of seasonal allergies.  URI  This is a new problem. The current episode started in the past 7 days. The problem has been unchanged. Associated symptoms include congestion, rhinorrhea and a sore throat (improving). Pertinent negatives include no chest pain, coughing, headaches, nausea, vomiting or wheezing. Associated symptoms comments: sneezing. Treatments tried: theraflu, benadryl. The treatment provided mild relief.   Past Medical History  Diagnosis Date  . Diverticulosis of colon   . CAD (coronary artery disease)     a. RCA occlusion with 2 drug-eluting stents 2004;  b. 90% stenosis distal to the stents in the same artery treated with drug-eluting stenting February 2012;  .c 12/2013 Cath: nonobs dzs.  . Melanoma ([place])   . Hypertension   . Hyperlipidemia   . GERD (gastroesophageal reflux disease)   . Myocardial infarction ([place]) 2004  . Complication of anesthesia   . PONV (postoperative nausea and vomiting)     Age 47- not since  . Shortness of breath dyspnea     with extertion   Allergies: Review of patient's allergies indicates no known allergies. Current Outpatient Prescriptions on File Prior to Visit  Medication Sig Dispense Refill  . aspirin EC 81 MG tablet Take 81 mg by mouth daily.    Marland Kitchen atorvastatin (LIPITOR) 20 MG tablet TAKE 1 TABLET BY MOUTH DAILY 90 tablet 1  . omeprazole (PRILOSEC) 20 MG capsule Take 20 mg by mouth daily as needed.     . ramipril (ALTACE) 2.5 MG capsule TAKE ONE CAPSULE BY MOUTH DAILY 90 capsule 2  . tretinoin (RETIN-A) 0.025 % cream Apply 1 application topically daily.    Marland Kitchen zoster vaccine live, PF, (ZOSTAVAX) 21308 UNT/0.65ML injection Inject 19,400 Units into the skin once. 1 each 0    No current facility-administered medications on file prior to visit.    Social History  Substance Use Topics  . Smoking status: Never Smoker   . Smokeless tobacco: Never Used  . Alcohol Use: 0.6 oz/week    1 Cans of beer per week    Review of Systems  Constitutional: Negative for fever and chills.  HENT: Positive for congestion, rhinorrhea and sore throat (improving). Negative for sinus pressure.   Respiratory: Negative for cough, shortness of breath and wheezing.   Cardiovascular: Negative for chest pain and palpitations.  Gastrointestinal: Negative for nausea and vomiting.  Neurological: Negative for headaches.      Objective:    BP 110/72 mmHg  Pulse 94  Temp(Src) 98.7 F (37.1 C) (Oral)  Ht 5\' 11"  (1.803 m)  Wt 200 lb (90.719 kg)  BMI 27.91 kg/m2  SpO2 96%   Physical Exam  Constitutional: Vital signs are normal. He appears well-developed and well-nourished.  HENT:  Head: Normocephalic and atraumatic.  Right Ear: Hearing, tympanic membrane, external ear and ear canal normal. No drainage, swelling or tenderness. Tympanic membrane is not injected, not erythematous and not bulging. No middle ear effusion. No decreased hearing is noted.  Left Ear: Hearing, tympanic membrane, external ear and ear canal normal. No drainage, swelling or tenderness. Tympanic membrane is not injected, not erythematous and not bulging.  No middle ear effusion. No decreased hearing is  noted.  Nose: Rhinorrhea present. Right sinus exhibits no maxillary sinus tenderness and no frontal sinus tenderness. Left sinus exhibits no maxillary sinus tenderness and no frontal sinus tenderness.  Mouth/Throat: Uvula is midline, oropharynx is clear and moist and mucous membranes are normal. No oropharyngeal exudate, posterior oropharyngeal edema, posterior oropharyngeal erythema or tonsillar abscesses.  Eyes: Conjunctivae are normal.  Cardiovascular: Regular rhythm and normal heart sounds.   Pulmonary/Chest:  Effort normal and breath sounds normal. No respiratory distress. He has no wheezes. He has no rhonchi. He has no rales.  Lymphadenopathy:       Head (right side): No submental, no submandibular, no tonsillar, no preauricular, no posterior auricular and no occipital adenopathy present.       Head (left side): No submental, no submandibular, no tonsillar, no preauricular, no posterior auricular and no occipital adenopathy present.    He has no cervical adenopathy.  Neurological: He is alert.  Skin: Skin is warm and dry.  Psychiatric: He has a normal mood and affect. His speech is normal and behavior is normal.  Vitals reviewed.      Assessment & Plan:   1. Allergic rhinitis, unspecified allergic rhinitis type Working diagnoses of allergic rhinitis. No evidence of acute bacterial infection at this time. As the patient and I agreed on conservative treatment at this time with trial of Claritin, Flonase.  - loratadine (CLARITIN) 10 MG tablet; Take 1 tablet (10 mg total) by mouth daily.  Dispense: 30 tablet; Refill: 11 - fluticasone (FLONASE) 50 MCG/ACT nasal spray; Place 2 sprays into both nostrils daily.  Dispense: 16 g; Refill: 2 - promethazine-dextromethorphan (PROMETHAZINE-DM) 6.25-15 MG/5ML syrup; Take 5 mLs by mouth at bedtime as needed for cough.  Dispense: 40 mL; Refill: 1    I am having Mr. Senna maintain his aspirin EC, omeprazole, tretinoin, atorvastatin, ramipril, and zoster vaccine live (PF).   No orders of the defined types were placed in this encounter.     Start medications as prescribed and explained to patient on After Visit Summary ( AVS). Risks, benefits, and alternatives of the medications and treatment plan prescribed today were discussed, and patient expressed understanding.   Education regarding symptom management and diagnosis given to patient.   Follow-up:Plan follow-up as discussed or as needed if any worsening symptoms or change in condition.   Continue to  follow with Viviana Simpler, MD for routine health maintenance.   Salem Caster and I agreed with plan.   Mable Paris, FNP

## 2016-04-19 ENCOUNTER — Telehealth: Payer: Self-pay | Admitting: Family

## 2016-04-19 NOTE — Telephone Encounter (Signed)
Spoke with the patient, discussed his concerns, he will continue to take the claritin and see how he feels.  No new symptoms since the appt.

## 2016-04-19 NOTE — Telephone Encounter (Signed)
Pt called about three different medications and still having the same symptoms running nose,snezzing. Still taking the pills not feeling has bad. Wanting to know is he on the right track? and why is it not clearing up? And should he still be taking the medications? Call pt @ [phone]. Pt saw Mable Paris on 5/9. Thank you!

## 2016-05-08 DIAGNOSIS — L820 Inflamed seborrheic keratosis: Secondary | ICD-10-CM | POA: Diagnosis not present

## 2016-05-08 DIAGNOSIS — Z85828 Personal history of other malignant neoplasm of skin: Secondary | ICD-10-CM | POA: Diagnosis not present

## 2016-05-08 DIAGNOSIS — L821 Other seborrheic keratosis: Secondary | ICD-10-CM | POA: Diagnosis not present

## 2016-05-24 NOTE — Progress Notes (Signed)
HPI The patient presents for follow up s/p stenting in 2012.  He repeat cath with a patent stent in Jan 2014.  Cardiac cath in January 2015 revealed nonobstructive CAD.   He returns for follow up.  Since I last saw him he has done well.  The patient denies any new symptoms such as chest discomfort, neck or arm discomfort. There has been no new shortness of breath, PND or orthopnea. There have been no reported palpitations, presyncope or syncope.   He carried his golf clubs 9 holes yesterday without limitations.   No Known Allergies  Current Outpatient Prescriptions  Medication Sig Dispense Refill  . aspirin EC 81 MG tablet Take 81 mg by mouth daily.    Marland Kitchen atorvastatin (LIPITOR) 20 MG tablet TAKE 1 TABLET BY MOUTH DAILY 90 tablet 1  . ramipril (ALTACE) 2.5 MG capsule TAKE ONE CAPSULE BY MOUTH DAILY 90 capsule 2  . tretinoin (RETIN-A) 0.025 % cream Apply 1 application topically daily.     No current facility-administered medications for this visit.    Past Medical History  Diagnosis Date  . Diverticulosis of colon   . CAD (coronary artery disease)     a. RCA occlusion with 2 drug-eluting stents 2004;  b. 90% stenosis distal to the stents in the same artery treated with drug-eluting stenting February 2012;  .c 12/2013 Cath: nonobs dzs.  . Melanoma ([place])   . Hypertension   . Hyperlipidemia   . GERD (gastroesophageal reflux disease)   . Myocardial infarction ([place]) 2004  . Complication of anesthesia   . PONV (postoperative nausea and vomiting)     Age 65- not since  . Shortness of breath dyspnea     with extertion    Past Surgical History  Procedure Laterality Date  . Ptca  2004  . Colonoscopy    . Melanoma excision      3 seperate ones  . Wisdom teeth removal    . Left heart catheterization with coronary angiogram N/A 12/14/2013    Procedure: LEFT HEART CATHETERIZATION WITH CORONARY ANGIOGRAM;  Surgeon: Sinclair Grooms, MD;  Location: [facility] CATH LAB;  Service: Cardiovascular;   Laterality: N/A;  . Esophagoscopy N/A 05/31/2015    Procedure: ESOPHAGOSCOPY;  Surgeon: Izora Gala, MD;  Location: [place];  Service: ENT;  Laterality: N/A;    ROS:  As stated in the HPI and negative for all other systems.  PHYSICAL EXAM BP 98/64 mmHg  Pulse 94  Ht 5\' 11"  (1.803 m)  Wt 189 lb (85.73 kg)  BMI 26.37 kg/m2 GENERAL:  Well appearing NECK:  No jugular venous distention, waveform within normal limits, carotid upstroke brisk and symmetric, no bruits, no thyromegaly LUNGS:  Clear to auscultation bilaterally BACK:  No CVA tenderness CHEST:  Unremarkable HEART:  PMI not displaced or sustained,S1 and S2 within normal limits, no S3, no S4, no clicks, no rubs, no murmurs ABD:  Flat, positive bowel sounds normal in frequency in pitch, no bruits, no rebound, no guarding, no midline pulsatile mass, no hepatomegaly, no splenomegaly EXT:  2 plus pulses throughout, no edema, no cyanosis no clubbing  EKG:  Sinus rhythm, rate 94 , axis within normal limits, intervals within normal limits, no acute ST-T wave changes. 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  05/25/2016  Lab Results  Component Value Date   CHOL 127 12/28/2015   TRIG 125.0 12/28/2015   HDL 43.90 12/28/2015   LDLCALC 58 12/28/2015     ASSESSMENT AND PLAN  CORONARY ARTERY DISEASE -  The patient has no new sypmtoms.  No further cardiovascular testing is indicated.  We will continue with aggressive risk reduction and meds as listed.  Please note he has continued nonspecific inferior ST changes with slight elevation that has been chronic over the years.  HYPERTENSION - His blood pressures actually running low.   However he is not having symptoms.  No change in therapy is planned.   DYSLIPIDEMIA -  His LDL was 58 and HDL 43.9 .  No change in therapy is indicated.

## 2016-05-25 ENCOUNTER — Ambulatory Visit (INDEPENDENT_AMBULATORY_CARE_PROVIDER_SITE_OTHER): Payer: Medicare Other | Admitting: Cardiology

## 2016-05-25 ENCOUNTER — Encounter: Payer: Self-pay | Admitting: Cardiology

## 2016-05-25 VITALS — BP 98/64 | HR 94 | Ht 71.0 in | Wt 189.0 lb

## 2016-05-25 DIAGNOSIS — I2510 Atherosclerotic heart disease of native coronary artery without angina pectoris: Secondary | ICD-10-CM

## 2016-05-25 DIAGNOSIS — I10 Essential (primary) hypertension: Secondary | ICD-10-CM | POA: Diagnosis not present

## 2016-05-25 NOTE — Patient Instructions (Signed)
Your physician wants you to follow-up in: 1 Year. You will receive a reminder letter in the mail two months in advance. If you don't receive a letter, please call our office to schedule the follow-up appointment.  If you need a refill on your cardiac medications before your next appointment, please call your pharmacy.

## 2016-06-07 ENCOUNTER — Other Ambulatory Visit: Payer: Self-pay | Admitting: *Deleted

## 2016-06-07 MED ORDER — ATORVASTATIN CALCIUM 20 MG PO TABS: 20.0000 mg | ORAL_TABLET | Freq: Every day | ORAL | Status: DC

## 2016-06-14 DIAGNOSIS — Z85828 Personal history of other malignant neoplasm of skin: Secondary | ICD-10-CM | POA: Diagnosis not present

## 2016-06-14 DIAGNOSIS — D225 Melanocytic nevi of trunk: Secondary | ICD-10-CM | POA: Diagnosis not present

## 2016-06-14 DIAGNOSIS — D485 Neoplasm of uncertain behavior of skin: Secondary | ICD-10-CM | POA: Diagnosis not present

## 2016-06-14 DIAGNOSIS — D2271 Melanocytic nevi of right lower limb, including hip: Secondary | ICD-10-CM | POA: Diagnosis not present

## 2016-06-14 DIAGNOSIS — L570 Actinic keratosis: Secondary | ICD-10-CM | POA: Diagnosis not present

## 2016-06-14 DIAGNOSIS — C44529 Squamous cell carcinoma of skin of other part of trunk: Secondary | ICD-10-CM | POA: Diagnosis not present

## 2016-07-03 DIAGNOSIS — Z85828 Personal history of other malignant neoplasm of skin: Secondary | ICD-10-CM | POA: Diagnosis not present

## 2016-07-03 DIAGNOSIS — L988 Other specified disorders of the skin and subcutaneous tissue: Secondary | ICD-10-CM | POA: Diagnosis not present

## 2016-07-03 DIAGNOSIS — D485 Neoplasm of uncertain behavior of skin: Secondary | ICD-10-CM | POA: Diagnosis not present

## 2016-07-20 ENCOUNTER — Encounter: Payer: Self-pay | Admitting: Pulmonary Disease

## 2016-07-26 DIAGNOSIS — D485 Neoplasm of uncertain behavior of skin: Secondary | ICD-10-CM | POA: Diagnosis not present

## 2016-07-26 DIAGNOSIS — Z85828 Personal history of other malignant neoplasm of skin: Secondary | ICD-10-CM | POA: Diagnosis not present

## 2016-07-26 DIAGNOSIS — L820 Inflamed seborrheic keratosis: Secondary | ICD-10-CM | POA: Diagnosis not present

## 2016-09-20 ENCOUNTER — Other Ambulatory Visit: Payer: Self-pay | Admitting: *Deleted

## 2016-09-20 MED ORDER — RAMIPRIL 2.5 MG PO CAPS: 2.5000 mg | ORAL_CAPSULE | Freq: Every day | ORAL | 2 refills | Status: DC

## 2016-10-17 DIAGNOSIS — D485 Neoplasm of uncertain behavior of skin: Secondary | ICD-10-CM | POA: Diagnosis not present

## 2016-10-17 DIAGNOSIS — Z85828 Personal history of other malignant neoplasm of skin: Secondary | ICD-10-CM | POA: Diagnosis not present

## 2016-10-17 DIAGNOSIS — L570 Actinic keratosis: Secondary | ICD-10-CM | POA: Diagnosis not present

## 2016-11-09 ENCOUNTER — Encounter: Payer: Self-pay | Admitting: Internal Medicine

## 2016-11-09 ENCOUNTER — Ambulatory Visit (INDEPENDENT_AMBULATORY_CARE_PROVIDER_SITE_OTHER): Payer: Medicare Other | Admitting: Internal Medicine

## 2016-11-09 VITALS — BP 118/70 | HR 100 | Temp 97.5°F | Wt 205.0 lb

## 2016-11-09 DIAGNOSIS — H10231 Serous conjunctivitis, except viral, right eye: Secondary | ICD-10-CM | POA: Diagnosis not present

## 2016-11-09 DIAGNOSIS — N5089 Other specified disorders of the male genital organs: Secondary | ICD-10-CM

## 2016-11-09 DIAGNOSIS — N509 Disorder of male genital organs, unspecified: Secondary | ICD-10-CM

## 2016-11-09 DIAGNOSIS — H109 Unspecified conjunctivitis: Secondary | ICD-10-CM | POA: Insufficient documentation

## 2016-11-09 DIAGNOSIS — I2510 Atherosclerotic heart disease of native coronary artery without angina pectoris: Secondary | ICD-10-CM | POA: Diagnosis not present

## 2016-11-09 MED ORDER — ERYTHROMYCIN 5 MG/GM OP OINT: 1.0000 "application " | TOPICAL_OINTMENT | Freq: Three times a day (TID) | OPHTHALMIC | 0 refills | Status: DC

## 2016-11-09 NOTE — Assessment & Plan Note (Signed)
Mentions this at end Irritation on right?? Infection Will give ointment just in case--hold off for now

## 2016-11-09 NOTE — Assessment & Plan Note (Signed)
Not typical location for benign cyst Will set up urology evaluation

## 2016-11-09 NOTE — Progress Notes (Signed)
Pre visit review using our clinic review tool, if applicable. No additional management support is needed unless otherwise documented below in the visit note. 

## 2016-11-09 NOTE — Progress Notes (Signed)
Subjective:    Patient ID: Juan [name], male    DOB: [birth year], 64 y.o.   MRN: [id number]  HPI Here due to apparent scrotal mass  Found a mass in scrotal sac About the size or a marble No pain No dysuria or hematuria  No recent sex Unaware of ejaculate changes  Current Outpatient Prescriptions on File Prior to Visit  Medication Sig Dispense Refill  . aspirin EC 81 MG tablet Take 81 mg by mouth daily.    Marland Kitchen atorvastatin (LIPITOR) 20 MG tablet Take 1 tablet (20 mg total) by mouth daily. 90 tablet 3  . ramipril (ALTACE) 2.5 MG capsule Take 1 capsule (2.5 mg total) by mouth daily. 90 capsule 2  . tretinoin (RETIN-A) 0.025 % cream Apply 1 application topically daily.     No current facility-administered medications on file prior to visit.     No Known Allergies  Past Medical History:  Diagnosis Date  . CAD (coronary artery disease)    a. RCA occlusion with 2 drug-eluting stents 2004;  b. 90% stenosis distal to the stents in the same artery treated with drug-eluting stenting February 2012;  .c 12/2013 Cath: nonobs dzs.  . Complication of anesthesia   . Diverticulosis of colon   . GERD (gastroesophageal reflux disease)   . Hyperlipidemia   . Hypertension   . Melanoma ([place])   . Myocardial infarction 2004  . PONV (postoperative nausea and vomiting)    Age 37- not since  . Shortness of breath dyspnea    with extertion    Past Surgical History:  Procedure Laterality Date  . COLONOSCOPY    . ESOPHAGOSCOPY N/A 05/31/2015   Procedure: ESOPHAGOSCOPY;  Surgeon: Izora Gala, MD;  Location: [place];  Service: ENT;  Laterality: N/A;  . LEFT HEART CATHETERIZATION WITH CORONARY ANGIOGRAM N/A 12/14/2013   Procedure: LEFT HEART CATHETERIZATION WITH CORONARY ANGIOGRAM;  Surgeon: Sinclair Grooms, MD;  Location: [facility] CATH LAB;  Service: Cardiovascular;  Laterality: N/A;  . MELANOMA EXCISION     3 seperate ones  . PTCA  2004  . wisdom teeth removal      Family History  Problem Relation  Age of Onset  . Heart attack Father 37    died  . Stroke Mother   . Hypertension Mother   . Colon cancer Neg Hx   . Pancreatic cancer Neg Hx   . Stomach cancer Neg Hx   . Esophageal cancer Neg Hx   . Rectal cancer Neg Hx   . Diabetes Neg Hx   . Hypertension Brother   . Cancer Brother     prostate    Social History   Social History  . Marital status: Married    Spouse name: N/A  . Number of children: 1  . Years of education: N/A   Occupational History  . 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 Smoking status: Never Smoker  . Smokeless tobacco: Never Used  . Alcohol use 0.6 oz/week    1 Cans of beer per week  . Drug use: No  . Sexual activity: Not on file   Other Topics Concern  . Not on file   Social History Narrative   No living will or health care POA   Would want wife to make decisions   Would accept resuscitation attempts   Would accept at least short term trial of tube feeds   Review of Systems No  fever Doesn't feel sick    Objective:   Physical Exam  Genitourinary:  Genitourinary Comments: Normal testes Hard nodule ~4-74mm seems to be in epididymis on left but not near testes          Assessment & Plan:

## 2016-12-06 DIAGNOSIS — N433 Hydrocele, unspecified: Secondary | ICD-10-CM | POA: Diagnosis not present

## 2016-12-06 DIAGNOSIS — Z8042 Family history of malignant neoplasm of prostate: Secondary | ICD-10-CM | POA: Diagnosis not present

## 2016-12-06 DIAGNOSIS — N429 Disorder of prostate, unspecified: Secondary | ICD-10-CM | POA: Diagnosis not present

## 2016-12-06 DIAGNOSIS — Z125 Encounter for screening for malignant neoplasm of prostate: Secondary | ICD-10-CM | POA: Diagnosis not present

## 2016-12-18 DIAGNOSIS — N509 Disorder of male genital organs, unspecified: Secondary | ICD-10-CM | POA: Diagnosis not present

## 2016-12-27 DIAGNOSIS — D485 Neoplasm of uncertain behavior of skin: Secondary | ICD-10-CM | POA: Diagnosis not present

## 2016-12-27 DIAGNOSIS — H43813 Vitreous degeneration, bilateral: Secondary | ICD-10-CM | POA: Diagnosis not present

## 2016-12-27 DIAGNOSIS — H524 Presbyopia: Secondary | ICD-10-CM | POA: Diagnosis not present

## 2016-12-27 DIAGNOSIS — H25813 Combined forms of age-related cataract, bilateral: Secondary | ICD-10-CM | POA: Diagnosis not present

## 2016-12-28 ENCOUNTER — Encounter: Payer: Medicare Other | Admitting: Internal Medicine

## 2016-12-28 DIAGNOSIS — Z85828 Personal history of other malignant neoplasm of skin: Secondary | ICD-10-CM | POA: Diagnosis not present

## 2016-12-28 DIAGNOSIS — D2211 Melanocytic nevi of right eyelid, including canthus: Secondary | ICD-10-CM | POA: Diagnosis not present

## 2016-12-28 DIAGNOSIS — L821 Other seborrheic keratosis: Secondary | ICD-10-CM | POA: Diagnosis not present

## 2016-12-28 DIAGNOSIS — B351 Tinea unguium: Secondary | ICD-10-CM | POA: Diagnosis not present

## 2016-12-28 DIAGNOSIS — D2272 Melanocytic nevi of left lower limb, including hip: Secondary | ICD-10-CM | POA: Diagnosis not present

## 2016-12-28 DIAGNOSIS — D2271 Melanocytic nevi of right lower limb, including hip: Secondary | ICD-10-CM | POA: Diagnosis not present

## 2016-12-28 DIAGNOSIS — L718 Other rosacea: Secondary | ICD-10-CM | POA: Diagnosis not present

## 2016-12-28 DIAGNOSIS — L570 Actinic keratosis: Secondary | ICD-10-CM | POA: Diagnosis not present

## 2016-12-28 DIAGNOSIS — B078 Other viral warts: Secondary | ICD-10-CM | POA: Diagnosis not present

## 2016-12-28 DIAGNOSIS — D485 Neoplasm of uncertain behavior of skin: Secondary | ICD-10-CM | POA: Diagnosis not present

## 2016-12-28 DIAGNOSIS — L308 Other specified dermatitis: Secondary | ICD-10-CM | POA: Diagnosis not present

## 2016-12-28 DIAGNOSIS — D225 Melanocytic nevi of trunk: Secondary | ICD-10-CM | POA: Diagnosis not present

## 2017-02-21 DIAGNOSIS — L570 Actinic keratosis: Secondary | ICD-10-CM | POA: Diagnosis not present

## 2017-02-21 DIAGNOSIS — Z85828 Personal history of other malignant neoplasm of skin: Secondary | ICD-10-CM | POA: Diagnosis not present

## 2017-02-21 DIAGNOSIS — L718 Other rosacea: Secondary | ICD-10-CM | POA: Diagnosis not present

## 2017-02-21 DIAGNOSIS — L821 Other seborrheic keratosis: Secondary | ICD-10-CM | POA: Diagnosis not present

## 2017-04-05 DIAGNOSIS — H01004 Unspecified blepharitis left upper eyelid: Secondary | ICD-10-CM | POA: Diagnosis not present

## 2017-04-05 DIAGNOSIS — H01001 Unspecified blepharitis right upper eyelid: Secondary | ICD-10-CM | POA: Diagnosis not present

## 2017-04-05 DIAGNOSIS — H0011 Chalazion right upper eyelid: Secondary | ICD-10-CM | POA: Diagnosis not present

## 2017-04-05 DIAGNOSIS — D485 Neoplasm of uncertain behavior of skin: Secondary | ICD-10-CM | POA: Diagnosis not present

## 2017-05-01 ENCOUNTER — Encounter: Payer: Self-pay | Admitting: Cardiology

## 2017-05-03 DIAGNOSIS — L570 Actinic keratosis: Secondary | ICD-10-CM | POA: Diagnosis not present

## 2017-05-03 DIAGNOSIS — Z85828 Personal history of other malignant neoplasm of skin: Secondary | ICD-10-CM | POA: Diagnosis not present

## 2017-05-13 ENCOUNTER — Other Ambulatory Visit: Payer: Self-pay | Admitting: Internal Medicine

## 2017-05-13 DIAGNOSIS — I10 Essential (primary) hypertension: Secondary | ICD-10-CM

## 2017-05-13 DIAGNOSIS — N400 Enlarged prostate without lower urinary tract symptoms: Secondary | ICD-10-CM

## 2017-05-14 ENCOUNTER — Other Ambulatory Visit (INDEPENDENT_AMBULATORY_CARE_PROVIDER_SITE_OTHER): Payer: Medicare Other

## 2017-05-14 DIAGNOSIS — I10 Essential (primary) hypertension: Secondary | ICD-10-CM

## 2017-05-14 DIAGNOSIS — N400 Enlarged prostate without lower urinary tract symptoms: Secondary | ICD-10-CM

## 2017-05-14 LAB — COMPREHENSIVE METABOLIC PANEL
ALT: 15 U/L (ref 0–53)
AST: 16 U/L (ref 0–37)
Albumin: 3.9 g/dL (ref 3.5–5.2)
Alkaline Phosphatase: 74 U/L (ref 39–117)
BUN: 11 mg/dL (ref 6–23)
CHLORIDE: 106 meq/L (ref 96–112)
CO2: 32 meq/L (ref 19–32)
CREATININE: 0.99 mg/dL (ref 0.40–1.50)
Calcium: 9.2 mg/dL (ref 8.4–10.5)
GFR: 79.93 mL/min (ref >60.00)
Glucose, Bld: 95 mg/dL (ref 70–99)
Potassium: 4.4 mEq/L (ref 3.5–5.1)
Sodium: 142 mEq/L (ref 135–145)
Total Bilirubin: 0.8 mg/dL (ref 0.2–1.2)
Total Protein: 6.6 g/dL (ref 6.0–8.3)

## 2017-05-14 LAB — CBC WITH DIFFERENTIAL/PLATELET
BASOS PCT: 0.5 % (ref 0.0–3.0)
Basophils Absolute: 0 10*3/uL (ref 0.0–0.1)
Eosinophils Absolute: 0.3 10*3/uL (ref 0.0–0.7)
Eosinophils Relative: 3.2 % (ref 0.0–5.0)
HCT: 44.7 % (ref 39.0–52.0)
Hemoglobin: 14.8 g/dL (ref 13.0–17.0)
LYMPHS ABS: 2 10*3/uL (ref 0.7–4.0)
Lymphocytes Relative: 23.5 % (ref 12.0–46.0)
MCHC: 33.1 g/dL (ref 30.0–36.0)
MCV: 81.6 fl (ref 78.0–100.0)
Monocytes Absolute: 0.9 10*3/uL (ref 0.1–1.0)
Monocytes Relative: 9.9 % (ref 3.0–12.0)
NEUTROS ABS: 5.5 10*3/uL (ref 1.4–7.7)
NEUTROS PCT: 62.9 % (ref 43.0–77.0)
Platelets: 273 10*3/uL (ref 150.0–400.0)
RBC: 5.48 Mil/uL (ref 4.22–5.81)
RDW: 14.7 % (ref 11.5–15.5)
WBC: 8.7 10*3/uL (ref 4.0–10.5)

## 2017-05-14 LAB — LIPID PANEL
CHOL/HDL RATIO: 3
Cholesterol: 114 mg/dL (ref 0–200)
HDL: 40.8 mg/dL (ref >39.00)
LDL Cholesterol: 53 mg/dL (ref 0–99)
NonHDL: 73.02
Triglycerides: 100 mg/dL (ref 0.0–149.0)
VLDL: 20 mg/dL (ref 0.0–40.0)

## 2017-05-14 LAB — PSA: PSA: 2.91 ng/mL (ref 0.10–4.00)

## 2017-05-17 ENCOUNTER — Ambulatory Visit (INDEPENDENT_AMBULATORY_CARE_PROVIDER_SITE_OTHER): Payer: Medicare Other | Admitting: Internal Medicine

## 2017-05-17 ENCOUNTER — Encounter: Payer: Self-pay | Admitting: Internal Medicine

## 2017-05-17 VITALS — BP 108/80 | HR 82 | Temp 97.8°F | Ht 70.75 in | Wt 198.0 lb

## 2017-05-17 DIAGNOSIS — I2510 Atherosclerotic heart disease of native coronary artery without angina pectoris: Secondary | ICD-10-CM | POA: Diagnosis not present

## 2017-05-17 DIAGNOSIS — I10 Essential (primary) hypertension: Secondary | ICD-10-CM

## 2017-05-17 DIAGNOSIS — Z0000 Encounter for general adult medical examination without abnormal findings: Secondary | ICD-10-CM

## 2017-05-17 DIAGNOSIS — Z7189 Other specified counseling: Secondary | ICD-10-CM

## 2017-05-17 DIAGNOSIS — N400 Enlarged prostate without lower urinary tract symptoms: Secondary | ICD-10-CM | POA: Diagnosis not present

## 2017-05-17 DIAGNOSIS — E785 Hyperlipidemia, unspecified: Secondary | ICD-10-CM

## 2017-05-17 NOTE — Assessment & Plan Note (Signed)
BP Readings from Last 3 Encounters:  05/17/17 108/80  11/09/16 118/70  05/25/16 98/64   Good control

## 2017-05-17 NOTE — Progress Notes (Signed)
Subjective:    Patient ID: Juan [name], male    DOB: 04-13-1951, 66 y.o.   MRN: [id number]  HPI Here for Medicare wellness visit and follow up of chronic health conditions Reviewed form and advanced directives Reviewed other doctors Stays active but no set exercise Occasional beer, rare bourbon No tobacco Vision and hearing are fine No falls No depression or anhedonia Independent with instrumental ADLs Mild memory problems--recall issues. He relates this to the lipitor  No new concerns Scrotal swelling is not that noticeable to him Had ultrasound which was reassuring  No problems with BP meds No headaches Doesn't check BP No chest pain or SOB Very slight lightheadedness if gets up quickly after leaning over No syncope No palpitations  No edema  No problems with statin No myalgias or GI problems  Voids okay Stream is fair Nocturia x 1-3 and stable  Current Outpatient Prescriptions on File Prior to Visit  Medication Sig Dispense Refill  . aspirin EC 81 MG tablet Take 81 mg by mouth daily.    Marland Kitchen atorvastatin (LIPITOR) 20 MG tablet Take 1 tablet (20 mg total) by mouth daily. 90 tablet 3  . erythromycin ophthalmic ointment Place 1 application into the right eye 3 (three) times daily. 3.5 g 0  . ramipril (ALTACE) 2.5 MG capsule Take 1 capsule (2.5 mg total) by mouth daily. 90 capsule 2  . tretinoin (RETIN-A) 0.025 % cream Apply 1 application topically daily.     No current facility-administered medications on file prior to visit.     No Known Allergies  Past Medical History:  Diagnosis Date  . CAD (coronary artery disease)    a. RCA occlusion with 2 drug-eluting stents 2004;  b. 90% stenosis distal to the stents in the same artery treated with drug-eluting stenting February 2012;  .c 12/2013 Cath: nonobs dzs.  . Complication of anesthesia   . Diverticulosis of colon   . GERD (gastroesophageal reflux disease)   . Hyperlipidemia   . Hypertension   . Melanoma ([place])    . Myocardial infarction ([place]) 2004  . PONV (postoperative nausea and vomiting)    Age 32- not since  . Shortness of breath dyspnea    with extertion    Past Surgical History:  Procedure Laterality Date  . COLONOSCOPY    . ESOPHAGOSCOPY N/A 05/31/2015   Procedure: ESOPHAGOSCOPY;  Surgeon: Izora Gala, MD;  Location: [place];  Service: ENT;  Laterality: N/A;  . LEFT HEART CATHETERIZATION WITH CORONARY ANGIOGRAM N/A 12/14/2013   Procedure: LEFT HEART CATHETERIZATION WITH CORONARY ANGIOGRAM;  Surgeon: Sinclair Grooms, MD;  Location: [facility] CATH LAB;  Service: Cardiovascular;  Laterality: N/A;  . MELANOMA EXCISION     3 seperate ones  . PTCA  2004  . wisdom teeth removal      Family History  Problem Relation Age of Onset  . Heart attack Father 32       died  . Stroke Mother   . Hypertension Mother   . Hypertension Brother   . Cancer Brother        prostate  . Colon cancer Neg Hx   . Pancreatic cancer Neg Hx   . Stomach cancer Neg Hx   . Esophageal cancer Neg Hx   . Rectal cancer Neg Hx   . Diabetes Neg Hx     Social History   Social History  . Marital status: Married    Spouse name: N/A  . Number of children: 1  .  Years of education: N/A   Occupational History  . 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 Smoking status: Never Smoker  . Smokeless tobacco: Never Used  . Alcohol use 0.6 oz/week    1 Cans of beer per week  . Drug use: No  . Sexual activity: Not on file   Other Topics Concern  . Not on file   Social History Narrative   No living will or health care POA   Would want wife to make decisions--then daughter   Would accept resuscitation attempts   Would accept at least short term trial of tube feeds   Review of Systems Wears seat belt Appetite is good Weight is stable Sleeps okay in general No recent heartburn. No dysphagia. No meds for this Bowels are good. No blood Regular visits with the  dermatologist. Efudex on forehead and forearms in past No sig back or joint pain    Objective:   Physical Exam  Constitutional: He is oriented to person, place, and time. He appears well-developed. No distress.  HENT:  Mouth/Throat: Oropharynx is clear and moist. No oropharyngeal exudate.  Neck: No thyromegaly present.  Cardiovascular: Normal rate, regular rhythm, normal heart sounds and intact distal pulses.  Exam reveals no gallop.   No murmur heard. Pulmonary/Chest: Effort normal and breath sounds normal. No respiratory distress. He has no wheezes. He has no rales.  Abdominal: Soft. There is no tenderness.  Musculoskeletal: He exhibits no edema or tenderness.  Lymphadenopathy:    He has no cervical adenopathy.  Neurological: He is alert and oriented to person, place, and time.  President--- "Megan Salon, Barack Quay Burow" [phone] [id number] Recall 3/3  Skin: No rash noted. No erythema.  Psychiatric: He has a normal mood and affect. His behavior is normal.          Assessment & Plan:

## 2017-05-17 NOTE — Progress Notes (Signed)
Pre visit review using our clinic review tool, if applicable. No additional management support is needed unless otherwise documented below in the visit note. 

## 2017-05-17 NOTE — Assessment & Plan Note (Signed)
See social history Forms given again

## 2017-05-17 NOTE — Assessment & Plan Note (Signed)
I have personally reviewed the Medicare Annual Wellness questionnaire and have noted 1. The patient's medical and social history 2. Their use of alcohol, tobacco or illicit drugs 3. Their current medications and supplements 4. The patient's functional ability including ADL's, fall risks, home safety risks and hearing or visual             impairment. 5. Diet and physical activities 6. Evidence for depression or mood disorders  The patients weight, height, BMI and visual acuity have been recorded in the chart I have made referrals, counseling and provided education to the patient based review of the above and I have provided the pt with a written personalized care plan for preventive services.  I have provided you with a copy of your personalized plan for preventive services. Please take the time to review along with your updated medication list.  Colon due 2024 Just had PSA Discussed fitness Yearly flu vaccine Will discuss shingrix next year

## 2017-05-17 NOTE — Assessment & Plan Note (Signed)
Mild symptoms only

## 2017-05-17 NOTE — Assessment & Plan Note (Signed)
No symptoms On appropriate Rx

## 2017-05-17 NOTE — Assessment & Plan Note (Signed)
No problems with statin Lab Results  Component Value Date   LDLCALC 53 05/14/2017

## 2017-05-19 NOTE — Progress Notes (Signed)
HPI The patient presents for follow up s/p stenting in 2012.  He repeat cath with a patent stent in Jan 2014.  Cardiac cath in January 2015 revealed nonobstructive CAD.  He returns for follow up.  He is still playing golf.  He is not exercising as much as I would like.  He is playing golf.  The patient denies any new symptoms such as chest discomfort, neck or arm discomfort. There has been no new shortness of breath, PND or orthopnea. There have been no reported palpitations, presyncope or syncope.  No Known Allergies  Current Outpatient Prescriptions  Medication Sig Dispense Refill  . aspirin EC 81 MG tablet Take 81 mg by mouth daily.    Marland Kitchen atorvastatin (LIPITOR) 20 MG tablet Take 1 tablet (20 mg total) by mouth daily. 90 tablet 3  . erythromycin ophthalmic ointment Place 1 application into the right eye 3 (three) times daily. 3.5 g 0  . ramipril (ALTACE) 2.5 MG capsule Take 1 capsule (2.5 mg total) by mouth daily. 90 capsule 2  . tretinoin (RETIN-A) 0.025 % cream Apply 1 application topically daily.     No current facility-administered medications for this visit.     Past Medical History:  Diagnosis Date  . CAD (coronary artery disease)    a. RCA occlusion with 2 drug-eluting stents 2004;  b. 90% stenosis distal to the stents in the same artery treated with drug-eluting stenting February 2012;  .c 12/2013 Cath: nonobs dzs.  . Complication of anesthesia   . Diverticulosis of colon   . GERD (gastroesophageal reflux disease)   . Hyperlipidemia   . Hypertension   . Melanoma ([place])   . Myocardial infarction ([place]) 2004  . PONV (postoperative nausea and vomiting)    Age 18- not since  . Shortness of breath dyspnea    with extertion    Past Surgical History:  Procedure Laterality Date  . COLONOSCOPY    . ESOPHAGOSCOPY N/A 05/31/2015   Procedure: ESOPHAGOSCOPY;  Surgeon: Izora Gala, MD;  Location: [facility];  Service: ENT;  Laterality: N/A;  . LEFT HEART CATHETERIZATION WITH CORONARY  ANGIOGRAM N/A 12/14/2013   Procedure: LEFT HEART CATHETERIZATION WITH CORONARY ANGIOGRAM;  Surgeon: Sinclair Grooms, MD;  Location: [facility] CATH LAB;  Service: Cardiovascular;  Laterality: N/A;  . MELANOMA EXCISION     3 seperate ones  . PTCA  2004  . wisdom teeth removal      ROS:  As stated in the HPI and negative for all other systems.   PHYSICAL EXAM BP 120/68 (BP Location: Left Arm, Patient Position: Sitting, Cuff Size: Normal)   Pulse 82   Ht 5\' 11"  (1.803 m)   Wt 200 lb 12.8 oz (91.1 kg)   BMI 28.01 kg/m   GENERAL:  Well appearing NECK:  No jugular venous distention, waveform within normal limits, carotid upstroke brisk and symmetric, no bruits, no thyromegaly LUNGS:  Clear to auscultation bilaterally BACK:  No CVA tenderness CHEST:  Unremarkable HEART:  PMI not displaced or sustained,S1 and S2 within normal limits, no S3, no S4, no clicks, no rubs, no murmurs ABD:  Flat, positive bowel sounds normal in frequency in pitch, no bruits, no rebound, no guarding, no midline pulsatile mass, no hepatomegaly, no splenomegaly EXT:  2 plus pulses throughout, no edema, no cyanosis no clubbing   EKG:  Sinus rhythm, rate 82, axis within normal limits, intervals within normal limits, no acute ST-T wave changes. 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  05/20/2017  Lab Results  Component Value Date   CHOL 114 05/14/2017   TRIG 100.0 05/14/2017   HDL 40.80 05/14/2017   LDLCALC 53 05/14/2017     ASSESSMENT AND PLAN  CORONARY ARTERY DISEASE -  The patient has no new symptoms.   I talked to him about more exercise and he bought an elliptical.  No further testing is planned.    HYPERTENSION - His blood pressure is well controlled.  He will continue the meds as listed.   DYSLIPIDEMIA -  His labs are as excellent.  He will continue the meds as listed.

## 2017-05-20 ENCOUNTER — Encounter: Payer: Self-pay | Admitting: Cardiology

## 2017-05-20 ENCOUNTER — Ambulatory Visit (INDEPENDENT_AMBULATORY_CARE_PROVIDER_SITE_OTHER): Payer: Medicare Other | Admitting: Cardiology

## 2017-05-20 VITALS — BP 120/68 | HR 82 | Ht 71.0 in | Wt 200.8 lb

## 2017-05-20 DIAGNOSIS — I2510 Atherosclerotic heart disease of native coronary artery without angina pectoris: Secondary | ICD-10-CM

## 2017-05-20 DIAGNOSIS — I10 Essential (primary) hypertension: Secondary | ICD-10-CM | POA: Diagnosis not present

## 2017-05-20 DIAGNOSIS — E785 Hyperlipidemia, unspecified: Secondary | ICD-10-CM

## 2017-05-20 NOTE — Patient Instructions (Signed)

## 2017-06-25 ENCOUNTER — Other Ambulatory Visit: Payer: Self-pay

## 2017-07-08 ENCOUNTER — Other Ambulatory Visit: Payer: Self-pay | Admitting: Cardiology

## 2017-08-07 DIAGNOSIS — D485 Neoplasm of uncertain behavior of skin: Secondary | ICD-10-CM | POA: Diagnosis not present

## 2017-08-07 DIAGNOSIS — L570 Actinic keratosis: Secondary | ICD-10-CM | POA: Diagnosis not present

## 2017-08-07 DIAGNOSIS — Z85828 Personal history of other malignant neoplasm of skin: Secondary | ICD-10-CM | POA: Diagnosis not present

## 2017-08-07 DIAGNOSIS — L239 Allergic contact dermatitis, unspecified cause: Secondary | ICD-10-CM | POA: Diagnosis not present

## 2017-08-07 DIAGNOSIS — L821 Other seborrheic keratosis: Secondary | ICD-10-CM | POA: Diagnosis not present

## 2017-08-07 DIAGNOSIS — L820 Inflamed seborrheic keratosis: Secondary | ICD-10-CM | POA: Diagnosis not present

## 2017-08-12 ENCOUNTER — Ambulatory Visit (INDEPENDENT_AMBULATORY_CARE_PROVIDER_SITE_OTHER): Payer: Medicare Other | Admitting: Primary Care

## 2017-08-12 VITALS — BP 124/74 | HR 90 | Temp 98.2°F | Ht 70.75 in | Wt 199.0 lb

## 2017-08-12 DIAGNOSIS — H6121 Impacted cerumen, right ear: Secondary | ICD-10-CM | POA: Diagnosis not present

## 2017-08-12 DIAGNOSIS — J069 Acute upper respiratory infection, unspecified: Secondary | ICD-10-CM

## 2017-08-12 DIAGNOSIS — I2510 Atherosclerotic heart disease of native coronary artery without angina pectoris: Secondary | ICD-10-CM | POA: Diagnosis not present

## 2017-08-12 MED ORDER — HYDROCOD POLST-CPM POLST ER 10-8 MG/5ML PO SUER: 5.0000 mL | Freq: Two times a day (BID) | ORAL | 0 refills | Status: DC | PRN

## 2017-08-12 NOTE — Patient Instructions (Signed)
Your symptoms are representative of a viral illness which will resolve on its own over time. Our goal is to treat your symptoms in order to aid your body in the healing process and to make you more comfortable.   Try Delsym or Robitussin for daytime cough. These may be purchased over the counter.  You may take the Tussionex cough suppressant twice daily as needed for cough and rest. Caution this medication contains codeine and will make you feel drowsy.  Ensure you are staying hydrated and rest.  Please notify me if you develop persistent fevers of 101, start coughing up green mucous, notice increased fatigue or weakness, or feel worse on Thursday this week.  It was a pleasure meeting you!   Upper Respiratory Infection, Adult Most upper respiratory infections (URIs) are a viral infection of the air passages leading to the lungs. A URI affects the nose, throat, and upper air passages. The most common type of URI is nasopharyngitis and is typically referred to as "the common cold." URIs run their course and usually go away on their own. Most of the time, a URI does not require medical attention, but sometimes a bacterial infection in the upper airways can follow a viral infection. This is called a secondary infection. Sinus and middle ear infections are common types of secondary upper respiratory infections. Bacterial pneumonia can also complicate a URI. A URI can worsen asthma and chronic obstructive pulmonary disease (COPD). Sometimes, these complications can require emergency medical care and may be life threatening. What are the causes? Almost all URIs are caused by viruses. A virus is a type of germ and can spread from one person to another. What increases the risk? You may be at risk for a URI if:  You smoke.  You have chronic heart or lung disease.  You have a weakened defense (immune) system.  You are very young or very old.  You have nasal allergies or asthma.  You work in  crowded or poorly ventilated areas.  You work in health care facilities or schools.  What are the signs or symptoms? Symptoms typically develop 2-3 days after you come in contact with a cold virus. Most viral URIs last 7-10 days. However, viral URIs from the influenza virus (flu virus) can last 14-18 days and are typically more severe. Symptoms may include:  Runny or stuffy (congested) nose.  Sneezing.  Cough.  Sore throat.  Headache.  Fatigue.  Fever.  Loss of appetite.  Pain in your forehead, behind your eyes, and over your cheekbones (sinus pain).  Muscle aches.  How is this diagnosed? Your health care provider may diagnose a URI by:  Physical exam.  Tests to check that your symptoms are not due to another condition such as: ? Strep throat. ? Sinusitis. ? Pneumonia. ? Asthma.  How is this treated? A URI goes away on its own with time. It cannot be cured with medicines, but medicines may be prescribed or recommended to relieve symptoms. Medicines may help:  Reduce your fever.  Reduce your cough.  Relieve nasal congestion.  Follow these instructions at home:  Take medicines only as directed by your health care provider.  Gargle warm saltwater or take cough drops to comfort your throat as directed by your health care provider.  Use a warm mist humidifier or inhale steam from a shower to increase air moisture. This may make it easier to breathe.  Drink enough fluid to keep your urine clear or pale yellow.  Eat  soups and other clear broths and maintain good nutrition.  Rest as needed.  Return to work when your temperature has returned to normal or as your health care provider advises. You may need to stay home longer to avoid infecting others. You can also use a face mask and careful hand washing to prevent spread of the virus.  Increase the usage of your inhaler if you have asthma.  Do not use any tobacco products, including cigarettes, chewing tobacco,  or electronic cigarettes. If you need help quitting, ask your health care provider. How is this prevented? The best way to protect yourself from getting a cold is to practice good hygiene.  Avoid oral or hand contact with people with cold symptoms.  Wash your hands often if contact occurs.  There is no clear evidence that vitamin C, vitamin E, echinacea, or exercise reduces the chance of developing a cold. However, it is always recommended to get plenty of rest, exercise, and practice good nutrition. Contact a health care provider if:  You are getting worse rather than better.  Your symptoms are not controlled by medicine.  You have chills.  You have worsening shortness of breath.  You have brown or red mucus.  You have yellow or brown nasal discharge.  You have pain in your face, especially when you bend forward.  You have a fever.  You have swollen neck glands.  You have pain while swallowing.  You have white areas in the back of your throat. Get help right away if:  You have severe or persistent: ? Headache. ? Ear pain. ? Sinus pain. ? Chest pain.  You have chronic lung disease and any of the following: ? Wheezing. ? Prolonged cough. ? Coughing up blood. ? A change in your usual mucus.  You have a stiff neck.  You have changes in your: ? Vision. ? Hearing. ? Thinking. ? Mood. This information is not intended to replace advice given to you by your health care provider. Make sure you discuss any questions you have with your health care provider. Document Released: 05/15/2001 Document Revised: 07/22/2016 Document Reviewed: 02/24/2014 Elsevier Interactive Patient Education  2017 [employer].

## 2017-08-12 NOTE — Progress Notes (Signed)
Subjective:    Patient ID: Juan [name], male    DOB: 1951-10-07, 66 y.o.   MRN: [id number]  HPI  Juan [name] is a 66 year old male with a history of GERD, hypertension managed on ACE who presents today with a chief complaint of cough. He also reports chest congestion, sore throat. His symptoms began about 6 days ago. He denies fevers, sinus pressure, and his cough is non productive. He's been taking Zyrtec and Benadryl without much improvement. Overall he's feeling fine, slightly better, but did have a coughing spell. His cough is worse at night.   Review of Systems  Constitutional: Negative for fatigue.  HENT: Positive for congestion. Negative for ear pain, sinus pressure and sore throat.   Respiratory: Positive for cough. Negative for shortness of breath.   Cardiovascular: Negative for chest pain.       Past Medical History:  Diagnosis Date  . CAD (coronary artery disease)    a. RCA occlusion with 2 drug-eluting stents 2004;  b. 90% stenosis distal to the stents in the same artery treated with drug-eluting stenting February 2012;  .c 12/2013 Cath: nonobs dzs.  . Complication of anesthesia   . Diverticulosis of colon   . GERD (gastroesophageal reflux disease)   . Hyperlipidemia   . Hypertension   . Melanoma ([name])   . Myocardial infarction ([place]) 2004  . PONV (postoperative nausea and vomiting)    Age 64- not since  . Shortness of breath dyspnea    with extertion     Social History   Social History  . Marital status: Married    Spouse name: N/A  . Number of children: 1  . Years of education: N/A   Occupational History  . 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 Smoking status: Never Smoker  . Smokeless tobacco: Never Used  . Alcohol use 0.6 oz/week    1 Cans of beer per week  . Drug use: No  . Sexual activity: Not on file   Other Topics Concern  . Not on file   Social History Narrative   No living will or health  care POA   Would want wife to make decisions--then daughter   Would accept resuscitation attempts   Would accept at least short term trial of tube feeds    Past Surgical History:  Procedure Laterality Date  . COLONOSCOPY    . ESOPHAGOSCOPY N/A 05/31/2015   Procedure: ESOPHAGOSCOPY;  Surgeon: Izora Gala, MD;  Location: [place];  Service: ENT;  Laterality: N/A;  . LEFT HEART CATHETERIZATION WITH CORONARY ANGIOGRAM N/A 12/14/2013   Procedure: LEFT HEART CATHETERIZATION WITH CORONARY ANGIOGRAM;  Surgeon: Sinclair Grooms, MD;  Location: [facility] CATH LAB;  Service: Cardiovascular;  Laterality: N/A;  . MELANOMA EXCISION     3 seperate ones  . PTCA  2004  . wisdom teeth removal      Family History  Problem Relation Age of Onset  . Heart attack Father 32       died  . Stroke Mother   . Hypertension Mother   . Hypertension Brother   . Cancer Brother        prostate  . Colon cancer Neg Hx   . Pancreatic cancer Neg Hx   . Stomach cancer Neg Hx   . Esophageal cancer Neg Hx   . Rectal cancer Neg Hx   . Diabetes Neg Hx  No Known Allergies  Current Outpatient Prescriptions on File Prior to Visit  Medication Sig Dispense Refill  . aspirin EC 81 MG tablet Take 81 mg by mouth daily.    Marland Kitchen atorvastatin (LIPITOR) 20 MG tablet TAKE 1 TABLET BY MOUTH DAILY 90 tablet 3  . erythromycin ophthalmic ointment Place 1 application into the right eye 3 (three) times daily. 3.5 g 0  . ramipril (ALTACE) 2.5 MG capsule TAKE ONE CAPSULE BY MOUTH DAILY 90 capsule 0  . tretinoin (RETIN-A) 0.025 % cream Apply 1 application topically daily.     No current facility-administered medications on file prior to visit.     BP 124/74   Pulse 90   Temp 98.2 F (36.8 C) (Oral)   Ht 5' 10.75" (1.797 m)   Wt 199 lb (90.3 kg)   SpO2 95%   BMI 27.95 kg/m    Objective:   Physical Exam  Constitutional: He appears well-nourished.  HENT:  Right Ear: Tympanic membrane and ear canal normal.  Left Ear: Tympanic  membrane and ear canal normal.  Nose: No mucosal edema. Right sinus exhibits no maxillary sinus tenderness and no frontal sinus tenderness. Left sinus exhibits no maxillary sinus tenderness and no frontal sinus tenderness.  Mouth/Throat: Oropharynx is clear and moist.  Right canal with deep cerumen impaction. Moderate amount of cerumen removed with curette, moderate amount of cerumen removed via irrigation. Tm and canal unremarkable curette and post irrigation.  Eyes: Conjunctivae are normal.  Neck: Neck supple.  Cardiovascular: Normal rate and regular rhythm.   Pulmonary/Chest: Effort normal and breath sounds normal. He has no wheezes. He has no rales.  Skin: Skin is warm and dry.          Assessment & Plan:  URI:  Cough, congestion x 6 days. Overall feeling well, cough is bothersome. Exam today with clear lungs, doesn't appear sickly, vitals WNL. Suspect viral involvement and will treat with conservative measures. Discussed Delsym or Robitussin for day cough. Continue Zyrtec. Rx printed for Tussionex to use HS. Drowsiness precautions provided. Fluids, rest, he will update later this week if no improvement.   Sheral Flow, NP

## 2017-08-19 ENCOUNTER — Telehealth: Payer: Self-pay

## 2017-08-19 DIAGNOSIS — J069 Acute upper respiratory infection, unspecified: Secondary | ICD-10-CM

## 2017-08-19 MED ORDER — AMOXICILLIN 875 MG PO TABS: 875.0000 mg | ORAL_TABLET | Freq: Two times a day (BID) | ORAL | 0 refills | Status: DC

## 2017-08-19 NOTE — Telephone Encounter (Signed)
Per DPR, left detail message of Kate's comments for patient. 

## 2017-08-19 NOTE — Telephone Encounter (Signed)
Pt left /vm; pt was seen 08/12/17 and is not better; still congestion,S/T and coughing. Pt request cb. Pt request medication or if needs to be seen schedule appt.[facility].

## 2017-08-19 NOTE — Telephone Encounter (Signed)
If no better then would recommend treatment with antibiotics. Start amoxicillin antibiotics. Take 1 tablet by mouth twice daily for 7 days.

## 2017-08-22 NOTE — Telephone Encounter (Signed)
He should have noticed some improvement by today, his symptoms still may not be bacterial and that may be the case if he's not noticing improvement. Antibiotics don't treat viruses, just bacteria. Would recommend Ibuprofen/tylenol, warm salt gargles, throat lozenges for sore throat. Mucinex DM for cough and congestion.  Zyrtec for post nasal drip, runny/congested nose. Pepcid for any esophageal burning, belching, heartburn symptoms.

## 2017-08-22 NOTE — Telephone Encounter (Signed)
Pt left v/m; pt started abx on 08/19/17 and pt cannot not see any improvement and wants to know when should expect improvement in condition. Pt request cb.

## 2017-08-22 NOTE — Telephone Encounter (Signed)
I let patient know Kate's comments. Patient said the cough is bothering him the worse.  Patient used the rx cough medicine. Patient said he finished it today, but it didn't help much. Patient wants to know is there another avenue to take for his cough.  Patient said when he lays down it's worse.

## 2017-08-23 ENCOUNTER — Ambulatory Visit (INDEPENDENT_AMBULATORY_CARE_PROVIDER_SITE_OTHER): Payer: Medicare Other | Admitting: Family Medicine

## 2017-08-23 ENCOUNTER — Encounter: Payer: Self-pay | Admitting: Family Medicine

## 2017-08-23 DIAGNOSIS — R05 Cough: Secondary | ICD-10-CM

## 2017-08-23 DIAGNOSIS — I2510 Atherosclerotic heart disease of native coronary artery without angina pectoris: Secondary | ICD-10-CM

## 2017-08-23 DIAGNOSIS — R059 Cough, unspecified: Secondary | ICD-10-CM

## 2017-08-23 MED ORDER — PREDNISONE 20 MG PO TABS: 20.0000 mg | ORAL_TABLET | Freq: Every day | ORAL | 0 refills | Status: DC

## 2017-08-23 MED ORDER — LORATADINE 10 MG PO TABS: 10.0000 mg | ORAL_TABLET | Freq: Every day | ORAL | 5 refills | Status: DC

## 2017-08-23 MED ORDER — BENZONATATE 200 MG PO CAPS: 200.0000 mg | ORAL_CAPSULE | Freq: Three times a day (TID) | ORAL | 0 refills | Status: DC | PRN

## 2017-08-23 NOTE — Telephone Encounter (Signed)
All coughs get worse when lying down---it is best to prop up when trying to sleep. I would recommend tessalon 200mg  tid prn (if he is not already trying this) If no other option, we could send robitussin with codeine 5-10cc tid prn (#120cc x 0)

## 2017-08-23 NOTE — Progress Notes (Signed)
Episodic cough, ongoing.  Had been coughing for about 3 weeks.  Started on amoxil in the meantime w/o improvement.  Hycodan syrup didn't help.  He started back on delsym.  The cough at night is really annoying and keeping him from sleeping.  He doesn't usually have allergy sx like this.   He has been treated previously with loratadine, about a year ago. He doesn't feel sick but the cough is really annoying. No fevers. Not short of breath.  Meds, vitals, and allergies reviewed.   ROS: Per HPI unless specifically indicated in ROS section   GEN: nad, alert and oriented HEENT: mucous membranes moist, tm w/o erythema, nasal exam w/o erythema, scant clear discharge noted,  OP with minimal cobblestoning NECK: supple w/o LA CV: rrr.   PULM: ctab, no inc wob EXT: no edema SKIN: no acute rash Dry cough noted.

## 2017-08-23 NOTE — Telephone Encounter (Signed)
Pt is seeing Dr Damita Dunnings right now

## 2017-08-23 NOTE — Patient Instructions (Addendum)
Finish the amoxil.  Try tessalon for the cough.  If not better, then try loratadine 10mg  a day.  If some better with that but not resolved, then okay to try prednisone with food.  Take care.  Glad to see you.

## 2017-08-25 DIAGNOSIS — R059 Cough, unspecified: Secondary | ICD-10-CM | POA: Insufficient documentation

## 2017-08-25 DIAGNOSIS — R05 Cough: Secondary | ICD-10-CM | POA: Insufficient documentation

## 2017-08-25 NOTE — Assessment & Plan Note (Signed)
Symptoms ongoing for the past few weeks. Differential diagnosis of chronic cough discussed with patient. No history of asthma or COPD. He does not have typical heartburn symptoms. He could have a postinfectious cough or a cough related to allergies/postnasal drip. Discussed with him about options. Nontoxic. Okay for outpatient follow-up. Reasonable to try Tessalon in the meantime. If that does not improve his cough he can add on loratadine 10 mg a day. If still with significant cough after that he can take a short course of prednisone with routine steroid cautions given. He can finish the current prescription for amoxicillin. Update [place] as needed. He agrees.

## 2017-08-26 ENCOUNTER — Telehealth: Payer: Self-pay

## 2017-08-26 DIAGNOSIS — R059 Cough, unspecified: Secondary | ICD-10-CM

## 2017-08-26 DIAGNOSIS — R05 Cough: Secondary | ICD-10-CM

## 2017-08-26 NOTE — Telephone Encounter (Signed)
Left detailed message on voicemail.  

## 2017-08-26 NOTE — Telephone Encounter (Signed)
Yes, okay to try.  Still okay to try prednisone if he has to use.  Thanks.

## 2017-08-26 NOTE — Telephone Encounter (Signed)
Pt seen 08/23/17; pt finished amoxil this morning; pt takes loratadine in AM and last night could not rest due to cough; tessalon not helping cough; pt took childrens benadryl last night and was able to sleep; pt has not started prednisone because pt has some concerns about taking prednisone. Pt wants to know if Dr Damita Dunnings says can take loratadine in AM and childrens bendryl at hs for cough. Pt request cb.

## 2017-09-02 ENCOUNTER — Ambulatory Visit (INDEPENDENT_AMBULATORY_CARE_PROVIDER_SITE_OTHER)
Admission: RE | Admit: 2017-09-02 | Discharge: 2017-09-02 | Disposition: A | Discharge status: Home / Self Care | Payer: Medicare Other | Source: Ambulatory Visit | Attending: Family Medicine | Admitting: Family Medicine

## 2017-09-02 DIAGNOSIS — R05 Cough: Secondary | ICD-10-CM | POA: Diagnosis not present

## 2017-09-02 DIAGNOSIS — R059 Cough, unspecified: Secondary | ICD-10-CM

## 2017-09-02 MED ORDER — OMEPRAZOLE 20 MG PO CPDR: 20.0000 mg | DELAYED_RELEASE_CAPSULE | Freq: Every day | ORAL | Status: DC

## 2017-09-02 NOTE — Telephone Encounter (Signed)
Patient advised.  Patient will come in for CXR this afternoon.  Patient has no fevers, SOB or sputum, just coughing spells and congestion.

## 2017-09-02 NOTE — Addendum Note (Signed)
Addended by: Tonia Ghent on: 09/02/2017 02:09 PM   Modules accepted: Orders

## 2017-09-02 NOTE — Telephone Encounter (Addendum)
Would have him come by for CXR w/o OV.  Ordered.  Would start prilosec 20mg  a day, can get OTC.  Take for 2 weeks.  If not better after that, may need pulmonary referral.   This plan assumes he is well o/w, ie no fevers, not SOB, no sputum.  If worse in the meantime, then needs CXR with OV/recheck.  D/w PCP, agreed.   Thanks.

## 2017-09-02 NOTE — Telephone Encounter (Signed)
Discussed plan with patient- I saw him in the hallway prior to CXR.  See notes on CXR.

## 2017-09-02 NOTE — Telephone Encounter (Signed)
Patient called stating that he is still having a cough and needs to know what else he should do to get rid of it?  Patient stated that he took the last Prednisone pill Friday. Patient stated that this cough has been going on for over a month. Patient stated that the cough is non-productive and seems to be a dry cough, but feels at times like there is something in his throat, but not getting anything out. .  Patient stated that he is taking Loratadine. Pharmacy-[facility]

## 2017-09-09 ENCOUNTER — Ambulatory Visit (INDEPENDENT_AMBULATORY_CARE_PROVIDER_SITE_OTHER): Payer: Medicare Other | Admitting: Internal Medicine

## 2017-09-09 ENCOUNTER — Encounter: Payer: Self-pay | Admitting: Internal Medicine

## 2017-09-09 VITALS — BP 134/76 | HR 80 | Temp 97.8°F | Resp 18 | Wt 195.0 lb

## 2017-09-09 DIAGNOSIS — R059 Cough, unspecified: Secondary | ICD-10-CM

## 2017-09-09 DIAGNOSIS — I2510 Atherosclerotic heart disease of native coronary artery without angina pectoris: Secondary | ICD-10-CM | POA: Diagnosis not present

## 2017-09-09 DIAGNOSIS — R05 Cough: Secondary | ICD-10-CM

## 2017-09-09 MED ORDER — AZITHROMYCIN 250 MG PO TABS: ORAL_TABLET | ORAL | 0 refills | Status: DC

## 2017-09-09 NOTE — Assessment & Plan Note (Addendum)
Reviewed prior visits, etc Persists---and now more head related and bullous myringitis Time course could be consistent with mycoplasma infection (though not most common at his age) Doubt related to ACEI since used for a long time Omeprazole no help--will stop CXR reviewed and looks fine. The minor chest findings could also go along with atypical infection Will try z-pak No need for pulmonary referral at this point--but should consider if has ongoing symptoms

## 2017-09-09 NOTE — Progress Notes (Signed)
Subjective:    Patient ID: Juan [name], male    DOB: 1951/07/20, 66 y.o.   MRN: [id number]  HPI Here due to ongoing cough  Reviewed previous visits Cough not as bad---now lighter Now with more noticeable congestion in head and feels water in ear---especially when he rolls his neck  No fever No SOB No chills or sweats  Trial of amoxicillin did not help Prednisone for 5 days--- no effect Omeprazole also not helping--did not notice heartburn before  Current Outpatient Prescriptions on File Prior to Visit  Medication Sig Dispense Refill  . aspirin EC 81 MG tablet Take 81 mg by mouth daily.    Marland Kitchen atorvastatin (LIPITOR) 20 MG tablet TAKE 1 TABLET BY MOUTH DAILY 90 tablet 3  . erythromycin ophthalmic ointment Place 1 application into the right eye 3 (three) times daily. 3.5 g 0  . loratadine (CLARITIN) 10 MG tablet Take 1 tablet (10 mg total) by mouth daily. 30 tablet 5  . omeprazole (PRILOSEC) 20 MG capsule Take 1 capsule (20 mg total) by mouth daily.    . ramipril (ALTACE) 2.5 MG capsule TAKE ONE CAPSULE BY MOUTH DAILY 90 capsule 0  . tretinoin (RETIN-A) 0.025 % cream Apply 1 application topically daily.     No current facility-administered medications on file prior to visit.     No Known Allergies  Past Medical History:  Diagnosis Date  . CAD (coronary artery disease)    a. RCA occlusion with 2 drug-eluting stents 2004;  b. 90% stenosis distal to the stents in the same artery treated with drug-eluting stenting February 2012;  .c 12/2013 Cath: nonobs dzs.  . Complication of anesthesia   . Diverticulosis of colon   . GERD (gastroesophageal reflux disease)   . Hyperlipidemia   . Hypertension   . Melanoma ([place])   . Myocardial infarction ([place]) 2004  . PONV (postoperative nausea and vomiting)    Age 25- not since  . Shortness of breath dyspnea    with extertion    Past Surgical History:  Procedure Laterality Date  . COLONOSCOPY    . ESOPHAGOSCOPY N/A 05/31/2015   Procedure: ESOPHAGOSCOPY;  Surgeon: Izora Gala, MD;  Location: [place];  Service: ENT;  Laterality: N/A;  . LEFT HEART CATHETERIZATION WITH CORONARY ANGIOGRAM N/A 12/14/2013   Procedure: LEFT HEART CATHETERIZATION WITH CORONARY ANGIOGRAM;  Surgeon: Sinclair Grooms, MD;  Location: [facility] CATH LAB;  Service: Cardiovascular;  Laterality: N/A;  . MELANOMA EXCISION     3 seperate ones  . PTCA  2004  . wisdom teeth removal      Family History  Problem Relation Age of Onset  . Heart attack Father 53       died  . Stroke Mother   . Hypertension Mother   . Hypertension Brother   . Cancer Brother        prostate  . Colon cancer Neg Hx   . Pancreatic cancer Neg Hx   . Stomach cancer Neg Hx   . Esophageal cancer Neg Hx   . Rectal cancer Neg Hx   . Diabetes Neg Hx     Social History   Social History  . Marital status: Married    Spouse name: N/A  . Number of children: 1  . Years of education: N/A   Occupational History  . 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 Smoking status: Never Smoker  . Smokeless tobacco: Never  Used  . Alcohol use 0.6 oz/week    1 Cans of beer per week  . Drug use: No  . Sexual activity: Not on file   Other Topics Concern  . Not on file   Social History Narrative   No living will or health care POA   Would want wife to make decisions--then daughter   Would accept resuscitation attempts   Would accept at least short term trial of tube feeds   Review of Systems  On ramipril for many years Never a smoker No rash No vomiting or diarrhea Appetite is okay     Objective:   Physical Exam  Constitutional: He appears well-nourished. No distress.  HENT:  Mouth/Throat: Oropharynx is clear and moist. No oropharyngeal exudate.  No sinus tenderness Bullous changes in superior portion of left TM with mild redness Left TM/canal normal Mild nasal inflammation  Pulmonary/Chest: Effort normal. No respiratory  distress. He has no wheezes.  Very slight right basilar crackles          Assessment & Plan:

## 2017-09-16 ENCOUNTER — Telehealth: Payer: Self-pay

## 2017-09-16 NOTE — Telephone Encounter (Signed)
Pt called back and pt is better but not well; pt has finished Z pack. Now pt said all symptoms are the same congestion is the same but maybe not quite as bad; lt ear still feels like fluid in ear, and cough is better but still coughing. Pt said hoped symptoms would be gone by now. Pt wants to know if anything else can be done. [place]. Please advise.

## 2017-09-16 NOTE — Telephone Encounter (Signed)
As I told him, this may just take some time to go away. Occasionally the antibiotic will help some--but it doesn't clear it up usually. If he is not worse, I think it is just best if we wait it out

## 2017-09-16 NOTE — Telephone Encounter (Signed)
Spoke to pt. He will let us know if he is getting worse.

## 2017-09-16 NOTE — Telephone Encounter (Signed)
Pt left v/m requesting cb; pt said had been seen 08/12/17 by Gentry Fitz NP and 09/09/17 Dr Silvio Pate. Left v/m for pt to cb for further info,.

## 2017-10-03 DIAGNOSIS — H93292 Other abnormal auditory perceptions, left ear: Secondary | ICD-10-CM | POA: Diagnosis not present

## 2017-10-03 DIAGNOSIS — H9042 Sensorineural hearing loss, unilateral, left ear, with unrestricted hearing on the contralateral side: Secondary | ICD-10-CM | POA: Diagnosis not present

## 2017-10-03 DIAGNOSIS — H6122 Impacted cerumen, left ear: Secondary | ICD-10-CM | POA: Diagnosis not present

## 2017-10-14 ENCOUNTER — Other Ambulatory Visit: Payer: Self-pay | Admitting: Cardiology

## 2017-10-29 DIAGNOSIS — H918X2 Other specified hearing loss, left ear: Secondary | ICD-10-CM | POA: Diagnosis not present

## 2017-12-10 DIAGNOSIS — R399 Unspecified symptoms and signs involving the genitourinary system: Secondary | ICD-10-CM | POA: Diagnosis not present

## 2017-12-10 DIAGNOSIS — N429 Disorder of prostate, unspecified: Secondary | ICD-10-CM | POA: Diagnosis not present

## 2017-12-10 DIAGNOSIS — Z87438 Personal history of other diseases of male genital organs: Secondary | ICD-10-CM | POA: Diagnosis not present

## 2018-01-15 DIAGNOSIS — R972 Elevated prostate specific antigen [PSA]: Secondary | ICD-10-CM | POA: Diagnosis not present

## 2018-01-20 ENCOUNTER — Encounter: Payer: Self-pay | Admitting: *Deleted

## 2018-01-20 ENCOUNTER — Other Ambulatory Visit: Payer: Self-pay | Admitting: Cardiology

## 2018-01-20 ENCOUNTER — Telehealth: Payer: Self-pay | Admitting: Cardiology

## 2018-01-20 NOTE — Telephone Encounter (Signed)
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

## 2018-01-20 NOTE — Telephone Encounter (Signed)
REFILL 

## 2018-01-20 NOTE — Telephone Encounter (Signed)
Spoke with pt, he is needing a letter to confirm he is our patient and is taking lipitor. Will google the side effects of lipitor for the patient. Will place at the front desk for patient pick up.

## 2018-01-21 DIAGNOSIS — M7541 Impingement syndrome of right shoulder: Secondary | ICD-10-CM | POA: Diagnosis not present

## 2018-01-21 DIAGNOSIS — M25511 Pain in right shoulder: Secondary | ICD-10-CM | POA: Diagnosis not present

## 2018-01-27 ENCOUNTER — Other Ambulatory Visit: Payer: Self-pay | Admitting: Specialist

## 2018-01-27 DIAGNOSIS — M25511 Pain in right shoulder: Secondary | ICD-10-CM

## 2018-01-29 DIAGNOSIS — D23111 Other benign neoplasm of skin of right upper eyelid, including canthus: Secondary | ICD-10-CM | POA: Diagnosis not present

## 2018-01-29 DIAGNOSIS — H43813 Vitreous degeneration, bilateral: Secondary | ICD-10-CM | POA: Diagnosis not present

## 2018-01-29 DIAGNOSIS — H25813 Combined forms of age-related cataract, bilateral: Secondary | ICD-10-CM | POA: Diagnosis not present

## 2018-01-29 DIAGNOSIS — H52203 Unspecified astigmatism, bilateral: Secondary | ICD-10-CM | POA: Diagnosis not present

## 2018-02-05 DIAGNOSIS — L821 Other seborrheic keratosis: Secondary | ICD-10-CM | POA: Diagnosis not present

## 2018-02-05 DIAGNOSIS — Z85828 Personal history of other malignant neoplasm of skin: Secondary | ICD-10-CM | POA: Diagnosis not present

## 2018-02-05 DIAGNOSIS — L111 Transient acantholytic dermatosis [Grover]: Secondary | ICD-10-CM | POA: Diagnosis not present

## 2018-02-05 DIAGNOSIS — L570 Actinic keratosis: Secondary | ICD-10-CM | POA: Diagnosis not present

## 2018-05-23 ENCOUNTER — Encounter: Payer: Self-pay | Admitting: Internal Medicine

## 2018-05-23 ENCOUNTER — Ambulatory Visit (INDEPENDENT_AMBULATORY_CARE_PROVIDER_SITE_OTHER): Payer: Medicare Other | Admitting: Internal Medicine

## 2018-05-23 VITALS — BP 112/78 | HR 78 | Temp 97.4°F | Ht 71.0 in | Wt 197.0 lb

## 2018-05-23 DIAGNOSIS — N400 Enlarged prostate without lower urinary tract symptoms: Secondary | ICD-10-CM | POA: Diagnosis not present

## 2018-05-23 DIAGNOSIS — I2510 Atherosclerotic heart disease of native coronary artery without angina pectoris: Secondary | ICD-10-CM

## 2018-05-23 DIAGNOSIS — Z0000 Encounter for general adult medical examination without abnormal findings: Secondary | ICD-10-CM

## 2018-05-23 DIAGNOSIS — I10 Essential (primary) hypertension: Secondary | ICD-10-CM

## 2018-05-23 DIAGNOSIS — Z7189 Other specified counseling: Secondary | ICD-10-CM

## 2018-05-23 LAB — COMPREHENSIVE METABOLIC PANEL
ALBUMIN: 4.1 g/dL (ref 3.5–5.2)
ALK PHOS: 71 U/L (ref 39–117)
ALT: 16 U/L (ref 0–53)
AST: 15 U/L (ref 0–37)
BUN: 11 mg/dL (ref 6–23)
CO2: 30 mEq/L (ref 19–32)
CREATININE: 1.05 mg/dL (ref 0.40–1.50)
Calcium: 9.1 mg/dL (ref 8.4–10.5)
Chloride: 104 mEq/L (ref 96–112)
GFR: 74.46 mL/min (ref >60.00)
GLUCOSE: 98 mg/dL (ref 70–99)
Potassium: 3.8 mEq/L (ref 3.5–5.1)
SODIUM: 141 meq/L (ref 135–145)
TOTAL PROTEIN: 7.2 g/dL (ref 6.0–8.3)
Total Bilirubin: 0.6 mg/dL (ref 0.2–1.2)

## 2018-05-23 LAB — LIPID PANEL
CHOLESTEROL: 131 mg/dL (ref 0–200)
HDL: 45.4 mg/dL (ref >39.00)
LDL Cholesterol: 63 mg/dL (ref 0–99)
NONHDL: 85.78
Total CHOL/HDL Ratio: 3
Triglycerides: 116 mg/dL (ref 0.0–149.0)
VLDL: 23.2 mg/dL (ref 0.0–40.0)

## 2018-05-23 LAB — CBC
HCT: 46 % (ref 39.0–52.0)
HEMOGLOBIN: 15 g/dL (ref 13.0–17.0)
MCHC: 32.7 g/dL (ref 30.0–36.0)
MCV: 82.8 fl (ref 78.0–100.0)
Platelets: 243 10*3/uL (ref 150.0–400.0)
RBC: 5.55 Mil/uL (ref 4.22–5.81)
RDW: 14.7 % (ref 11.5–15.5)
WBC: 7.9 10*3/uL (ref 4.0–10.5)

## 2018-05-23 NOTE — Assessment & Plan Note (Signed)
Mild symptoms now

## 2018-05-23 NOTE — Progress Notes (Signed)
Subjective:    Patient ID: Juan [name], male    DOB: 08/10/1951, 67 y.o.   MRN: [id number]  HPI Here for Medicare wellness visit and follow up of chronic health conditions Reviewed form and advanced directives Reviewed other doctors Still enjoys occasional occ beer or bourbon Vision and hearing are fine No falls No depression or anhedonia Still runs his own business Independent with instrumental ADLs No sig memory issues---he does think he has some recall issues since being on lipitor  Did go back to Dr Amalia Hailey for follow up--with the scrotal issue Had PSA and it went up to 7 Got antibiotic for a month----PSA dropped to 4 Now waiting and will repeat in a few months  Urinary stream seems okay Some urgency--mostly at night Nocturia is variable--but rarely over once a night No major daytime symptoms  No chest pain No SOB No set exercise---did join the [employer] No palpitations  Rare lightheadedness if bends over for a while and stands up quickly. No syncope  No myalgias or GI problems with statin  Current Outpatient Medications on File Prior to Visit  Medication Sig Dispense Refill  . aspirin EC 81 MG tablet Take 81 mg by mouth daily.    Marland Kitchen atorvastatin (LIPITOR) 20 MG tablet TAKE 1 TABLET BY MOUTH DAILY 90 tablet 3  . erythromycin ophthalmic ointment Place 1 application into the right eye 3 (three) times daily. 3.5 g 0  . loratadine (CLARITIN) 10 MG tablet Take 1 tablet (10 mg total) by mouth daily. 30 tablet 5  . ramipril (ALTACE) 2.5 MG capsule TAKE ONE CAPSULE BY MOUTH DAILY 90 capsule 3  . tretinoin (RETIN-A) 0.025 % cream Apply 1 application topically daily.     No current facility-administered medications on file prior to visit.     No Known Allergies  Past Medical History:  Diagnosis Date  . CAD (coronary artery disease)    a. RCA occlusion with 2 drug-eluting stents 2004;  b. 90% stenosis distal to the stents in the same artery treated with drug-eluting stenting  February 2012;  .c 12/2013 Cath: nonobs dzs.  . Complication of anesthesia   . Diverticulosis of colon   . GERD (gastroesophageal reflux disease)   . Hyperlipidemia   . Hypertension   . Melanoma ([place])   . Myocardial infarction ([place]) 2004  . PONV (postoperative nausea and vomiting)    Age 66- not since  . Shortness of breath dyspnea    with extertion    Past Surgical History:  Procedure Laterality Date  . COLONOSCOPY    . ESOPHAGOSCOPY N/A 05/31/2015   Procedure: ESOPHAGOSCOPY;  Surgeon: Izora Gala, MD;  Location: [place];  Service: ENT;  Laterality: N/A;  . LEFT HEART CATHETERIZATION WITH CORONARY ANGIOGRAM N/A 12/14/2013   Procedure: LEFT HEART CATHETERIZATION WITH CORONARY ANGIOGRAM;  Surgeon: Sinclair Grooms, MD;  Location: [facility] CATH LAB;  Service: Cardiovascular;  Laterality: N/A;  . MELANOMA EXCISION     3 seperate ones  . PTCA  2004  . wisdom teeth removal      Family History  Problem Relation Age of Onset  . Heart attack Father 22       died  . Stroke Mother   . Hypertension Mother   . Hypertension Brother   . Cancer Brother        prostate  . Colon cancer Neg Hx   . Pancreatic cancer Neg Hx   . Stomach cancer Neg Hx   . Esophageal cancer  Neg Hx   . Rectal cancer Neg Hx   . Diabetes Neg Hx     Social History   Socioeconomic History  . Marital status: Married    Spouse name: Not on file  . Number of children: 1  . Years of education: Not on file  . Highest education level: Not on file  Occupational History  . 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 Financial resource strain: Not on file  . Food insecurity:    Worry: Not on file    Inability: Not on file  . Transportation needs:    Medical: Not on file    Non-medical: Not on file  Tobacco Use  . Smoking status: Never Smoker  . Smokeless tobacco: Never Used  Substance and Sexual Activity  . Alcohol use: Yes    Alcohol/week: 0.6 oz    Types: 1  Cans of beer per week  . Drug use: No  . Sexual activity: Not on file  Lifestyle  . Physical activity:    Days per week: Not on file    Minutes per session: Not on file  . Stress: Not on file  Relationships  . Social connections:    Talks on phone: Not on file    Gets together: Not on file    Attends religious service: Not on file    Active member of club or organization: Not on file    Attends meetings of clubs or organizations: Not on file    Relationship status: Not on file  . Intimate partner violence:    Fear of current or ex partner: Not on file    Emotionally abused: Not on file    Physically abused: Not on file    Forced sexual activity: Not on file  Other Topics Concern  . Not on file  Social History Narrative   No living will or health care POA   Would want wife to make decisions--then daughter   Would accept resuscitation attempts   Would accept at least short term trial of tube feeds   Review of Systems Weight up slightly Appetite is fine Sleeps well Wears seat belt Teeth are fine. Regular with dentist Bowels are fine. No blood No sig back or joint pains No heartburn or dysphagia Chronic skin issues---regular with dermatologist     Objective:   Physical Exam  Constitutional: He is oriented to person, place, and time. He appears well-developed. No distress.  HENT:  Mouth/Throat: Oropharynx is clear and moist. No oropharyngeal exudate.  Neck: No thyromegaly present.  Cardiovascular: Normal rate, regular rhythm, normal heart sounds and intact distal pulses. Exam reveals no gallop.  No murmur heard. Respiratory: Effort normal and breath sounds normal. No respiratory distress. He has no wheezes. He has no rales.  GI: Soft. There is no tenderness.  Musculoskeletal: He exhibits no edema or tenderness.  Lymphadenopathy:    He has no cervical adenopathy.  Neurological: He is alert and oriented to person, place, and time.  President---"Donald Clifton Custard, Jennye Boroughs" [id number] Recall 3/3  Skin: No rash noted. No erythema.  Psychiatric: He has a normal mood and affect. His behavior is normal.           Assessment & Plan:

## 2018-05-23 NOTE — Assessment & Plan Note (Signed)
See social history 

## 2018-05-23 NOTE — Progress Notes (Signed)
Hearing Screening Comments: November 2018 Vision Screening Comments: March 2019

## 2018-05-23 NOTE — Patient Instructions (Signed)
Please ask your pharmacist about the shingrix vaccine. 

## 2018-05-23 NOTE — Assessment & Plan Note (Signed)
I have personally reviewed the Medicare Annual Wellness questionnaire and have noted 1. The patient's medical and social history 2. Their use of alcohol, tobacco or illicit drugs 3. Their current medications and supplements 4. The patient's functional ability including ADL's, fall risks, home safety risks and hearing or visual             impairment. 5. Diet and physical activities 6. Evidence for depression or mood disorders  The patients weight, height, BMI and visual acuity have been recorded in the chart I have made referrals, counseling and provided education to the patient based review of the above and I have provided the pt with a written personalized care plan for preventive services.  I have provided you with a copy of your personalized plan for preventive services. Please take the time to review along with your updated medication list.  Yearly flu vaccine Discussed starting exercise Recommended shingrix Colon due 2024 Working with the urologist about the elevated PSA

## 2018-05-23 NOTE — Assessment & Plan Note (Signed)
BP Readings from Last 3 Encounters:  05/23/18 112/78  09/09/17 134/76  08/23/17 110/70   Good control Check labs

## 2018-05-23 NOTE — Assessment & Plan Note (Signed)
Has been quiet Discussed fitness

## 2018-06-14 NOTE — Progress Notes (Signed)
HPI The patient presents for follow up s/p stenting in 2012.  He repeat cath with a patent stent in Jan 2014.  Cardiac cath in January 2015 revealed nonobstructive CAD.  He returns for follow up.  He has joined the [employer].  However, he is not exercising.  The patient denies any new symptoms such as chest discomfort, neck or arm discomfort. There has been no new shortness of breath, PND or orthopnea. There have been no reported palpitations, presyncope or syncope.   No Known Allergies  Current Outpatient Medications  Medication Sig Dispense Refill  . aspirin EC 81 MG tablet Take 81 mg by mouth daily.    Marland Kitchen atorvastatin (LIPITOR) 20 MG tablet TAKE 1 TABLET BY MOUTH DAILY 90 tablet 3  . loratadine (CLARITIN) 10 MG tablet Take 1 tablet (10 mg total) by mouth daily. 30 tablet 5  . ramipril (ALTACE) 2.5 MG capsule TAKE ONE CAPSULE BY MOUTH DAILY 90 capsule 3  . tretinoin (RETIN-A) 0.025 % cream Apply 1 application topically daily.     No current facility-administered medications for this visit.     Past Medical History:  Diagnosis Date  . CAD (coronary artery disease)    a. RCA occlusion with 2 drug-eluting stents 2004;  b. 90% stenosis distal to the stents in the same artery treated with drug-eluting stenting February 2012;  .c 12/2013 Cath: nonobs dzs.  . Complication of anesthesia   . Diverticulosis of colon   . GERD (gastroesophageal reflux disease)   . Hyperlipidemia   . Hypertension   . Melanoma ([place])   . Myocardial infarction ([place]) 2004  . PONV (postoperative nausea and vomiting)    Age 67- not since  . Shortness of breath dyspnea    with extertion    Past Surgical History:  Procedure Laterality Date  . COLONOSCOPY    . ESOPHAGOSCOPY N/A 05/31/2015   Procedure: ESOPHAGOSCOPY;  Surgeon: Izora Gala, MD;  Location: [place];  Service: ENT;  Laterality: N/A;  . LEFT HEART CATHETERIZATION WITH CORONARY ANGIOGRAM N/A 12/14/2013   Procedure: LEFT HEART CATHETERIZATION WITH CORONARY  ANGIOGRAM;  Surgeon: Sinclair Grooms, MD;  Location: [facility] CATH LAB;  Service: Cardiovascular;  Laterality: N/A;  . MELANOMA EXCISION     3 seperate ones  . PTCA  2004  . wisdom teeth removal      ROS:  As stated in the HPI and negative for all other systems.   PHYSICAL EXAM BP 132/74   Pulse 84   Ht 5\' 11"  (1.803 m)   Wt 198 lb (89.8 kg)   SpO2 99%   BMI 27.62 kg/m   GENERAL:  Well appearing NECK:  No jugular venous distention, waveform within normal limits, carotid upstroke brisk and symmetric, no bruits, no thyromegaly LUNGS:  Clear to auscultation bilaterally CHEST:  Unremarkable HEART:  PMI not displaced or sustained,S1 and S2 within normal limits, no S3, no S4, no clicks, no rubs, no murmurs ABD:  Flat, positive bowel sounds normal in frequency in pitch, no bruits, no rebound, no guarding, no midline pulsatile mass, no hepatomegaly, no splenomegaly EXT:  2 plus pulses throughout, no edema, no cyanosis no clubbing  EKG:  Sinus rhythm, rate 84, axis within normal limits, intervals within normal limits, no acute ST-T wave changes. 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  06/16/2018  Lab Results  Component Value Date   CHOL 131 05/23/2018   TRIG 116.0 05/23/2018   HDL 45.40 05/23/2018  [place] 63 05/23/2018     ASSESSMENT AND PLAN  CORONARY ARTERY DISEASE -  The patient has no new sypmtoms.  No further cardiovascular testing is indicated.  We will continue with aggressive risk reduction and meds as listed.  HYPERTENSION - His blood pressure is well controlled.  No change in therapy.   DYSLIPIDEMIA -  His labs are as excellent as above.  No change in meds.  He was using CBD oil which can interact and make less effective Lipitor.  We discussed this.

## 2018-06-16 ENCOUNTER — Encounter: Payer: Self-pay | Admitting: Cardiology

## 2018-06-16 ENCOUNTER — Ambulatory Visit (INDEPENDENT_AMBULATORY_CARE_PROVIDER_SITE_OTHER): Payer: Medicare Other | Admitting: Cardiology

## 2018-06-16 VITALS — BP 132/74 | HR 84 | Ht 71.0 in | Wt 198.0 lb

## 2018-06-16 DIAGNOSIS — I2510 Atherosclerotic heart disease of native coronary artery without angina pectoris: Secondary | ICD-10-CM

## 2018-06-16 DIAGNOSIS — E785 Hyperlipidemia, unspecified: Secondary | ICD-10-CM | POA: Diagnosis not present

## 2018-06-16 DIAGNOSIS — I10 Essential (primary) hypertension: Secondary | ICD-10-CM

## 2018-06-16 NOTE — Patient Instructions (Signed)
Medication Instructions:  Continue current medications  If you need a refill on your cardiac medications before your next appointment, please call your pharmacy.  Labwork: None Ordered   Testing/Procedures: None Ordered  Follow-Up: Your physician wants you to follow-up in: 1 Year. You should receive a reminder letter in the mail two months in advance. If you do not receive a letter, please call our office [phone].    Thank you for choosing [facility]!!      

## 2018-06-19 DIAGNOSIS — L111 Transient acantholytic dermatosis [Grover]: Secondary | ICD-10-CM | POA: Diagnosis not present

## 2018-06-19 DIAGNOSIS — L821 Other seborrheic keratosis: Secondary | ICD-10-CM | POA: Diagnosis not present

## 2018-06-19 DIAGNOSIS — Z85828 Personal history of other malignant neoplasm of skin: Secondary | ICD-10-CM | POA: Diagnosis not present

## 2018-06-19 DIAGNOSIS — L570 Actinic keratosis: Secondary | ICD-10-CM | POA: Diagnosis not present

## 2018-06-19 DIAGNOSIS — Z85820 Personal history of malignant melanoma of skin: Secondary | ICD-10-CM | POA: Diagnosis not present

## 2018-06-19 DIAGNOSIS — D485 Neoplasm of uncertain behavior of skin: Secondary | ICD-10-CM | POA: Diagnosis not present

## 2018-06-19 DIAGNOSIS — C44222 Squamous cell carcinoma of skin of right ear and external auricular canal: Secondary | ICD-10-CM | POA: Diagnosis not present

## 2018-06-19 DIAGNOSIS — C44229 Squamous cell carcinoma of skin of left ear and external auricular canal: Secondary | ICD-10-CM | POA: Diagnosis not present

## 2018-06-30 ENCOUNTER — Telehealth: Payer: Self-pay | Admitting: *Deleted

## 2018-07-17 DIAGNOSIS — N429 Disorder of prostate, unspecified: Secondary | ICD-10-CM | POA: Diagnosis not present

## 2018-07-17 DIAGNOSIS — Z87438 Personal history of other diseases of male genital organs: Secondary | ICD-10-CM | POA: Diagnosis not present

## 2018-07-23 ENCOUNTER — Other Ambulatory Visit: Payer: Self-pay | Admitting: Cardiology

## 2018-07-23 DIAGNOSIS — C44222 Squamous cell carcinoma of skin of right ear and external auricular canal: Secondary | ICD-10-CM | POA: Diagnosis not present

## 2018-07-23 DIAGNOSIS — Z85828 Personal history of other malignant neoplasm of skin: Secondary | ICD-10-CM | POA: Diagnosis not present

## 2018-07-23 DIAGNOSIS — Z85820 Personal history of malignant melanoma of skin: Secondary | ICD-10-CM | POA: Diagnosis not present

## 2018-07-23 NOTE — Telephone Encounter (Signed)
Rx request sent to pharmacy.  

## 2018-10-23 ENCOUNTER — Telehealth: Payer: Self-pay | Admitting: Internal Medicine

## 2018-10-23 NOTE — Telephone Encounter (Signed)
Patient has appointment for a cpx on 05/29/19 with Dr.Letvak.  Dr.Letvak will be out of office. I left a message on patient's voice mail to call back and r/s appointment.

## 2018-12-08 ENCOUNTER — Ambulatory Visit (INDEPENDENT_AMBULATORY_CARE_PROVIDER_SITE_OTHER): Payer: Medicare Other | Admitting: Family Medicine

## 2018-12-08 ENCOUNTER — Ambulatory Visit (INDEPENDENT_AMBULATORY_CARE_PROVIDER_SITE_OTHER): Payer: Medicare Other

## 2018-12-08 ENCOUNTER — Encounter: Payer: Self-pay | Admitting: Family Medicine

## 2018-12-08 VITALS — BP 122/80 | HR 70 | Temp 97.7°F | Ht 71.0 in | Wt 201.0 lb

## 2018-12-08 DIAGNOSIS — M79671 Pain in right foot: Secondary | ICD-10-CM

## 2018-12-08 NOTE — Assessment & Plan Note (Signed)
Findings suggest a possible tear of the plantar fascia.  - pennsaid  - try midfoot arch strap  - has a cane to help with ambulation. May need to try a post op shoe or boot  - xray  - f/u in 2-3 weeks. May need to try injection if pain is significant.

## 2018-12-08 NOTE — Patient Instructions (Signed)
Nice to meet you  Please try the mid foot arch strap  Please try to ice the area  Please try to keep the weight off of the foot  I will call you with the results from today  Please see me back in 2-3 weeks.

## 2018-12-08 NOTE — Progress Notes (Signed)
Juan [name] - 68 y.o. male MRN [id number]  Date of birth: [birth year]  SUBJECTIVE:  Including CC & ROS.  Chief Complaint  Patient presents with  . Foot Pain    right    Juan [name] is a 68 y.o. male that is  Presenting with right foot pain. He was playing pickle ball and felt a sharp pain in the bottom of his heel when he took a step. This happened yesterday. Having moderate to severe pain when he steps on his foot. No pain at rest. Pain localized to the heel. No swelling or bruising. No history of similar pain.   Review of Systems  Constitutional: Negative for fever.  HENT: Negative for congestion.   Respiratory: Negative for cough.   Cardiovascular: Negative for chest pain.  Gastrointestinal: Negative for abdominal pain.  Musculoskeletal: Positive for gait problem.  Skin: Negative for color change.  Neurological: Negative for weakness.  Hematological: Negative for adenopathy.  Psychiatric/Behavioral: Negative for agitation.    HISTORY: Past Medical, Surgical, Social, and Family History Reviewed & Updated per EMR.   Pertinent Historical Findings include:  Past Medical History:  Diagnosis Date  . CAD (coronary artery disease)    a. RCA occlusion with 2 drug-eluting stents 2004;  b. 90% stenosis distal to the stents in the same artery treated with drug-eluting stenting February 2012;  .c 12/2013 Cath: nonobs dzs.  . Complication of anesthesia   . Diverticulosis of colon   . GERD (gastroesophageal reflux disease)   . Hyperlipidemia   . Hypertension   . Melanoma ([place])   . Myocardial infarction ([place]) 2004  . PONV (postoperative nausea and vomiting)    Age 14- not since  . Shortness of breath dyspnea    with extertion    Past Surgical History:  Procedure Laterality Date  . COLONOSCOPY    . ESOPHAGOSCOPY N/A 05/31/2015   Procedure: ESOPHAGOSCOPY;  Surgeon: Izora Gala, MD;  Location: [place];  Service: ENT;  Laterality: N/A;  . LEFT HEART CATHETERIZATION WITH CORONARY  ANGIOGRAM N/A 12/14/2013   Procedure: LEFT HEART CATHETERIZATION WITH CORONARY ANGIOGRAM;  Surgeon: Sinclair Grooms, MD;  Location: [facility] CATH LAB;  Service: Cardiovascular;  Laterality: N/A;  . MELANOMA EXCISION     3 seperate ones  . PTCA  2004  . wisdom teeth removal      No Known Allergies  Family History  Problem Relation Age of Onset  . Heart attack Father 35       died  . Stroke Mother   . Hypertension Mother   . Hypertension Brother   . Cancer Brother        prostate  . Colon cancer Neg Hx   . Pancreatic cancer Neg Hx   . Stomach cancer Neg Hx   . Esophageal cancer Neg Hx   . Rectal cancer Neg Hx   . Diabetes Neg Hx      Social History   Socioeconomic History  . Marital status: Married    Spouse name: Not on file  . Number of children: 1  . Years of education: Not on file  . Highest education level: Not on file  Occupational History  . 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 Financial resource strain: Not on file  . Food insecurity:    Worry: Not on file    Inability: Not on file  . Transportation needs:    Medical: Not on file  Non-medical: Not on file  Tobacco Use  . Smoking status: Never Smoker  . Smokeless tobacco: Never Used  Substance and Sexual Activity  . Alcohol use: Yes    Alcohol/week: 1.0 standard drinks    Types: 1 Cans of beer per week  . Drug use: No  . Sexual activity: Not on file  Lifestyle  . Physical activity:    Days per week: Not on file    Minutes per session: Not on file  . Stress: Not on file  Relationships  . Social connections:    Talks on phone: Not on file    Gets together: Not on file    Attends religious service: Not on file    Active member of club or organization: Not on file    Attends meetings of clubs or organizations: Not on file    Relationship status: Not on file  . Intimate partner violence:    Fear of current or ex partner: Not on file     Emotionally abused: Not on file    Physically abused: Not on file    Forced sexual activity: Not on file  Other Topics Concern  . Not on file  Social History Narrative   No living will or health care POA   Would want wife to make decisions--then daughter   Would accept resuscitation attempts   Would accept at least short term trial of tube feeds     PHYSICAL EXAM:  VS: BP 122/80   Pulse 70   Temp 97.7 F (36.5 C) (Oral)   Ht 5\' 11"  (1.803 m)   Wt 201 lb (91.2 kg)   SpO2 97%   BMI 28.03 kg/m  Physical Exam Gen: NAD, alert, cooperative with exam, well-appearing ENT: normal lips, normal nasal mucosa,  Eye: normal EOM, normal conjunctiva and lids CV:  no edema, +2 pedal pulses   Resp: no accessory muscle use, non-labored,  Skin: no rashes, no areas of induration  Neuro: normal tone, normal sensation to touch Psych:  normal insight, alert and oriented MSK:  Right foot:  No swelling or ecchymosis  TTP at the origin of the plantar fascia Normal ROM  Pain with stepping onto the heel  Normal longitudinal arch  Neurovascularly intact   Limited ultrasound: right foot:  Disruption of normal appearing tissue at the origin to suspect a possible tear.  Heel spur of calcaneous is present  Normal appearing tissue of the distal plantar fascia   Summary: possible partial tear of the plantar fascia.   Ultrasound and interpretation by Clearance Coots, MD       ASSESSMENT & PLAN:   Right foot pain Findings suggest a possible tear of the plantar fascia.  - pennsaid  - try midfoot arch strap  - has a cane to help with ambulation. May need to try a post op shoe or boot  - xray  - f/u in 2-3 weeks. May need to try injection if pain is significant.

## 2018-12-09 ENCOUNTER — Telehealth: Payer: Self-pay | Admitting: Family Medicine

## 2018-12-09 NOTE — Telephone Encounter (Signed)
Left VM for patient. If he calls back please have him speak with a nurse/CMA and inform that his xrays don't show fracture. The [facility] can report results to patient.   If any questions then please take the best time and phone number to call and I will try to call him back.   Rosemarie Ax, MD [facility] 12/09/2018, 9:33 AM

## 2018-12-09 NOTE — Telephone Encounter (Signed)
Pt. Given results from x-ray. Verbalizes understanding.

## 2018-12-11 DIAGNOSIS — N400 Enlarged prostate without lower urinary tract symptoms: Secondary | ICD-10-CM | POA: Diagnosis not present

## 2018-12-11 DIAGNOSIS — R972 Elevated prostate specific antigen [PSA]: Secondary | ICD-10-CM | POA: Diagnosis not present

## 2018-12-24 DIAGNOSIS — L84 Corns and callosities: Secondary | ICD-10-CM | POA: Diagnosis not present

## 2018-12-24 DIAGNOSIS — Z85820 Personal history of malignant melanoma of skin: Secondary | ICD-10-CM | POA: Diagnosis not present

## 2018-12-24 DIAGNOSIS — D485 Neoplasm of uncertain behavior of skin: Secondary | ICD-10-CM | POA: Diagnosis not present

## 2018-12-24 DIAGNOSIS — L570 Actinic keratosis: Secondary | ICD-10-CM | POA: Diagnosis not present

## 2018-12-24 DIAGNOSIS — L918 Other hypertrophic disorders of the skin: Secondary | ICD-10-CM | POA: Diagnosis not present

## 2018-12-24 DIAGNOSIS — Z85828 Personal history of other malignant neoplasm of skin: Secondary | ICD-10-CM | POA: Diagnosis not present

## 2018-12-24 DIAGNOSIS — L111 Transient acantholytic dermatosis [Grover]: Secondary | ICD-10-CM | POA: Diagnosis not present

## 2018-12-24 DIAGNOSIS — L821 Other seborrheic keratosis: Secondary | ICD-10-CM | POA: Diagnosis not present

## 2018-12-24 DIAGNOSIS — D225 Melanocytic nevi of trunk: Secondary | ICD-10-CM | POA: Diagnosis not present

## 2018-12-24 DIAGNOSIS — B351 Tinea unguium: Secondary | ICD-10-CM | POA: Diagnosis not present

## 2018-12-24 DIAGNOSIS — D2271 Melanocytic nevi of right lower limb, including hip: Secondary | ICD-10-CM | POA: Diagnosis not present

## 2019-01-29 DIAGNOSIS — H52203 Unspecified astigmatism, bilateral: Secondary | ICD-10-CM | POA: Diagnosis not present

## 2019-01-29 DIAGNOSIS — H43813 Vitreous degeneration, bilateral: Secondary | ICD-10-CM | POA: Diagnosis not present

## 2019-01-29 DIAGNOSIS — H25813 Combined forms of age-related cataract, bilateral: Secondary | ICD-10-CM | POA: Diagnosis not present

## 2019-01-29 DIAGNOSIS — H0011 Chalazion right upper eyelid: Secondary | ICD-10-CM | POA: Diagnosis not present

## 2019-02-01 ENCOUNTER — Other Ambulatory Visit: Payer: Self-pay | Admitting: Cardiology

## 2019-05-29 ENCOUNTER — Encounter: Payer: Medicare Other | Admitting: Internal Medicine

## 2019-06-03 ENCOUNTER — Encounter: Payer: Self-pay | Admitting: Internal Medicine

## 2019-06-03 ENCOUNTER — Other Ambulatory Visit: Payer: Self-pay

## 2019-06-03 ENCOUNTER — Ambulatory Visit (INDEPENDENT_AMBULATORY_CARE_PROVIDER_SITE_OTHER): Payer: Medicare Other | Admitting: Internal Medicine

## 2019-06-03 VITALS — BP 110/72 | HR 75 | Temp 97.7°F | Ht 71.0 in | Wt 183.0 lb

## 2019-06-03 DIAGNOSIS — I2510 Atherosclerotic heart disease of native coronary artery without angina pectoris: Secondary | ICD-10-CM | POA: Diagnosis not present

## 2019-06-03 DIAGNOSIS — Z0000 Encounter for general adult medical examination without abnormal findings: Secondary | ICD-10-CM | POA: Diagnosis not present

## 2019-06-03 DIAGNOSIS — N401 Enlarged prostate with lower urinary tract symptoms: Secondary | ICD-10-CM

## 2019-06-03 DIAGNOSIS — I10 Essential (primary) hypertension: Secondary | ICD-10-CM | POA: Diagnosis not present

## 2019-06-03 DIAGNOSIS — Z7189 Other specified counseling: Secondary | ICD-10-CM | POA: Diagnosis not present

## 2019-06-03 DIAGNOSIS — E785 Hyperlipidemia, unspecified: Secondary | ICD-10-CM | POA: Diagnosis not present

## 2019-06-03 LAB — COMPREHENSIVE METABOLIC PANEL
ALT: 16 U/L (ref 0–53)
AST: 18 U/L (ref 0–37)
Albumin: 4.1 g/dL (ref 3.5–5.2)
Alkaline Phosphatase: 71 U/L (ref 39–117)
BUN: 10 mg/dL (ref 6–23)
CO2: 31 mEq/L (ref 19–32)
Calcium: 9 mg/dL (ref 8.4–10.5)
Chloride: 104 mEq/L (ref 96–112)
Creatinine, Ser: 0.89 mg/dL (ref 0.40–1.50)
GFR: 84.53 mL/min (ref >60.00)
Glucose, Bld: 97 mg/dL (ref 70–99)
Potassium: 4.3 mEq/L (ref 3.5–5.1)
Sodium: 140 mEq/L (ref 135–145)
Total Bilirubin: 1 mg/dL (ref 0.2–1.2)
Total Protein: 6.7 g/dL (ref 6.0–8.3)

## 2019-06-03 LAB — CBC
HCT: 45.5 % (ref 39.0–52.0)
Hemoglobin: 14.5 g/dL (ref 13.0–17.0)
MCHC: 31.8 g/dL (ref 30.0–36.0)
MCV: 83.7 fl (ref 78.0–100.0)
Platelets: 261 10*3/uL (ref 150.0–400.0)
RBC: 5.43 Mil/uL (ref 4.22–5.81)
RDW: 15.2 % (ref 11.5–15.5)
WBC: 7.2 10*3/uL (ref 4.0–10.5)

## 2019-06-03 LAB — LIPID PANEL
Cholesterol: 112 mg/dL (ref 0–200)
HDL: 41.6 mg/dL (ref >39.00)
LDL Cholesterol: 53 mg/dL (ref 0–99)
NonHDL: 70.76
Total CHOL/HDL Ratio: 3
Triglycerides: 90 mg/dL (ref 0.0–149.0)
VLDL: 18 mg/dL (ref 0.0–40.0)

## 2019-06-03 NOTE — Assessment & Plan Note (Signed)
I have personally reviewed the Medicare Annual Wellness questionnaire and have noted 1. The patient's medical and social history 2. Their use of alcohol, tobacco or illicit drugs 3. Their current medications and supplements 4. The patient's functional ability including ADL's, fall risks, home safety risks and hearing or visual             impairment. 5. Diet and physical activities 6. Evidence for depression or mood disorders  The patients weight, height, BMI and visual acuity have been recorded in the chart I have made referrals, counseling and provided education to the patient based review of the above and I have provided the pt with a written personalized care plan for preventive services.  I have provided you with a copy of your personalized plan for preventive services. Please take the time to review along with your updated medication list.  Colon due 2014 Did great work on fitness Recommended flu vaccine in fall He will check with pharmacist about shingrix PSA by urologist

## 2019-06-03 NOTE — Progress Notes (Signed)
Hearing Screening   125Hz  250Hz  500Hz  1000Hz  2000Hz  3000Hz  4000Hz  6000Hz  8000Hz   Right ear:           Left ear:           Comments: November 2019  Vision Screening Comments: February 2020

## 2019-06-03 NOTE — Progress Notes (Signed)
Subjective:    Patient ID: Juan [name], male    DOB: 01/27/1951, 68 y.o.   MRN: [id number]  HPI Here for Medicare wellness and follow up of chronic health conditions Reviewed form and advanced directives Reviewed other doctors Still works---- wife has horses and he helps with their care Vision is fine Hearing is okay--did get hearing check with mild loss on left No tobacco products Occasional beer or bourbon Is exercising No falls No depression or anhedonia Independent with instrumental ADLs No sig memory issues  He has lost near 20# Has cut back on eating--stopped going out for lunches Walking the golf course--has really helped  Sees cardiologist once a year No chest pain  No SOB and exercise tolerance is pretty good No dizziness (other than when standing up after leaning over) or syncope No edema No palpitations  No problems with statin No myalgias or GI problems  Continues with the urologist Scrotal lump gone PSA had gone down---still getting yearly checks Voids okay--seems to empty Nocturia x 2 generally  Current Outpatient Medications on File Prior to Visit  Medication Sig Dispense Refill  . aspirin EC 81 MG tablet Take 81 mg by mouth daily.    Marland Kitchen atorvastatin (LIPITOR) 20 MG tablet TAKE 1 TABLET BY MOUTH DAILY 90 tablet 3  . ramipril (ALTACE) 2.5 MG capsule TAKE ONE CAPSULE BY MOUTH DAILY 90 capsule 2  . tretinoin (RETIN-A) 0.025 % cream Apply 1 application topically daily.     No current facility-administered medications on file prior to visit.     No Known Allergies  Past Medical History:  Diagnosis Date  . CAD (coronary artery disease)    a. RCA occlusion with 2 drug-eluting stents 2004;  b. 90% stenosis distal to the stents in the same artery treated with drug-eluting stenting February 2012;  .c 12/2013 Cath: nonobs dzs.  . Complication of anesthesia   . Diverticulosis of colon   . GERD (gastroesophageal reflux disease)   . Hyperlipidemia   .  Hypertension   . Melanoma ([place])   . Myocardial infarction ([place]) 2004  . PONV (postoperative nausea and vomiting)    Age 51- not since  . Shortness of breath dyspnea    with extertion    Past Surgical History:  Procedure Laterality Date  . COLONOSCOPY    . ESOPHAGOSCOPY N/A 05/31/2015   Procedure: ESOPHAGOSCOPY;  Surgeon: Izora Gala, MD;  Location: [facility];  Service: ENT;  Laterality: N/A;  . LEFT HEART CATHETERIZATION WITH CORONARY ANGIOGRAM N/A 12/14/2013   Procedure: LEFT HEART CATHETERIZATION WITH CORONARY ANGIOGRAM;  Surgeon: Sinclair Grooms, MD;  Location: [facility] CATH LAB;  Service: Cardiovascular;  Laterality: N/A;  . MELANOMA EXCISION     3 seperate ones  . PTCA  2004  . wisdom teeth removal      Family History  Problem Relation Age of Onset  . Heart attack Father 58       died  . Stroke Mother   . Hypertension Mother   . Hypertension Brother   . Cancer Brother        prostate  . Colon cancer Neg Hx   . Pancreatic cancer Neg Hx   . Stomach cancer Neg Hx   . Esophageal cancer Neg Hx   . Rectal cancer Neg Hx   . Diabetes Neg Hx     Social History   Socioeconomic History  . Marital status: Married    Spouse name: Not on file  . Number  of children: 1  . Years of education: Not on file  . Highest education level: Not on file  Occupational History  . 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 Financial resource strain: Not on file  . Food insecurity    Worry: Not on file    Inability: Not on file  . Transportation needs    Medical: Not on file    Non-medical: Not on file  Tobacco Use  . Smoking status: Never Smoker  . Smokeless tobacco: Never Used  Substance and Sexual Activity  . Alcohol use: Yes    Alcohol/week: 1.0 standard drinks    Types: 1 Cans of beer per week  . Drug use: No  . Sexual activity: Not on file  Lifestyle  . Physical activity    Days per week: Not on file    Minutes per session:  Not on file  . Stress: Not on file  Relationships  . Social Herbalist on phone: Not on file    Gets together: Not on file    Attends religious service: Not on file    Active member of club or organization: Not on file    Attends meetings of clubs or organizations: Not on file    Relationship status: Not on file  . Intimate partner violence    Fear of current or ex partner: Not on file    Emotionally abused: Not on file    Physically abused: Not on file    Forced sexual activity: Not on file  Other Topics Concern  . Not on file  Social History Narrative   No living will or health care POA   Would want wife to make decisions--then daughter   Would accept resuscitation attempts   Would accept at least short term trial of tube feeds   Review of Systems Appetite is good Sleeps fairly well Wears seat belt Bowels are fine. No blood No heartburn. Mild dysphagia--has to be careful about meat or bread (but not recently) Chronic skin issues--- skin cancer in past year and regular with derm (efudex/blue lights, etc) No sig back or joint pain    Objective:   Physical Exam  Constitutional: He is oriented to person, place, and time. He appears well-developed. No distress.  HENT:  Mouth/Throat: Oropharynx is clear and moist. No oropharyngeal exudate.  Neck: No thyromegaly present.  Cardiovascular: Normal rate, regular rhythm, normal heart sounds and intact distal pulses. Exam reveals no gallop.  No murmur heard. Respiratory: Effort normal and breath sounds normal. No respiratory distress. He has no wheezes. He has no rales.  GI: Soft. There is no abdominal tenderness.  Musculoskeletal:        General: No tenderness or edema.  Lymphadenopathy:    He has no cervical adenopathy.  Neurological: He is alert and oriented to person, place, and time.  President--- "Daisy Floro, Barack Abbe [place] Bush" [id number] D-l-r-o-w Recall 3/3  Skin: No rash noted. No  erythema.  Psychiatric: He has a normal mood and affect. His behavior is normal.           Assessment & Plan:

## 2019-06-03 NOTE — Assessment & Plan Note (Signed)
Secondary prevention Will check labs

## 2019-06-03 NOTE — Addendum Note (Signed)
Addended by: Viviana Simpler I on: 06/03/2019 10:05 AM   Modules accepted: Orders

## 2019-06-03 NOTE — Assessment & Plan Note (Signed)
Mild symptoms Not regular for meds

## 2019-06-03 NOTE — Assessment & Plan Note (Signed)
BP Readings from Last 3 Encounters:  06/03/19 110/72  12/08/18 122/80  06/16/18 132/74   Down with his weight loss

## 2019-06-03 NOTE — Assessment & Plan Note (Signed)
No symptoms Appropriate meds (other than no beta blocker--but BP fine)

## 2019-06-03 NOTE — Assessment & Plan Note (Signed)
See social history 

## 2019-06-04 ENCOUNTER — Encounter: Payer: Self-pay | Admitting: *Deleted

## 2019-06-14 NOTE — Progress Notes (Signed)
HPI The patient presents for follow up of CAD.  since I last saw him he has done well.  He golfs a lot for exercise.  The patient denies any new symptoms such as chest discomfort, neck or arm discomfort. There has been no new shortness of breath, PND or orthopnea. There have been no reported palpitations, presyncope or syncope. ted palpitations, presyncope or syncope.   He has had an intentional weight loss since I last saw him.    No Known Allergies  Current Outpatient Medications  Medication Sig Dispense Refill  . aspirin EC 81 MG tablet Take 81 mg by mouth daily.    Marland Kitchen atorvastatin (LIPITOR) 20 MG tablet TAKE 1 TABLET BY MOUTH DAILY 90 tablet 3  . ramipril (ALTACE) 2.5 MG capsule TAKE ONE CAPSULE BY MOUTH DAILY 90 capsule 2  . tretinoin (RETIN-A) 0.025 % cream Apply 1 application topically daily.     No current facility-administered medications for this visit.     Past Medical History:  Diagnosis Date  . CAD (coronary artery disease)    a. RCA occlusion with 2 drug-eluting stents 2004;  b. 90% stenosis distal to the stents in the same artery treated with drug-eluting stenting February 2012;  .c 12/2013 Cath: nonobs dzs.  . Complication of anesthesia   . Diverticulosis of colon   . GERD (gastroesophageal reflux disease)   . Hyperlipidemia   . Hypertension   . Melanoma ([place])   . Myocardial infarction ([place]) 2004  . PONV (postoperative nausea and vomiting)    Age 42- not since  . Shortness of breath dyspnea    with extertion    Past Surgical History:  Procedure Laterality Date  . COLONOSCOPY    . ESOPHAGOSCOPY N/A 05/31/2015   Procedure: ESOPHAGOSCOPY;  Surgeon: Izora Gala, MD;  Location: [facility];  Service: ENT;  Laterality: N/A;  . LEFT HEART CATHETERIZATION WITH CORONARY ANGIOGRAM N/A 12/14/2013   Procedure: LEFT HEART CATHETERIZATION WITH CORONARY ANGIOGRAM;  Surgeon: Sinclair Grooms, MD;  Location: [facility] CATH LAB;  Service: Cardiovascular;  Laterality: N/A;  . MELANOMA  EXCISION     3 seperate ones  . PTCA  2004  . wisdom teeth removal      ROS:  As stated in the HPI and negative for all other systems   PHYSICAL EXAM BP 120/67   Pulse 75   Ht 5\' 11"  (1.803 m)   Wt 183 lb 9.6 oz (83.3 kg)   BMI 25.61 kg/m   GENERAL:  Well appearing NECK:  No jugular venous distention, waveform within normal limits, carotid upstroke brisk and symmetric, no bruits, no thyromegaly LUNGS:  Clear to auscultation bilaterally CHEST:  Unremarkable HEART:  PMI not displaced or sustained,S1 and S2 within normal limits, no S3, no S4, no clicks, no rubs, no murmurs ABD:  Flat, positive bowel sounds normal in frequency in pitch, no bruits, no rebound, no guarding, possible midline pulsatile mass, no hepatomegaly, no splenomegaly EXT:  2 plus pulses throughout, no edema, no cyanosis no clubbing   EKG:  Sinus rhythm, rate 75, axis within normal limits, intervals within normal limits, no acute ST-T wave changes. 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  06/16/2019  Lab Results  Component Value Date   CHOL 112 06/03/2019   TRIG 90.0 06/03/2019   HDL 41.60 06/03/2019   LDLCALC 53 06/03/2019     ASSESSMENT AND PLAN  CORONARY ARTERY DISEASE -  The patient has no new sypmtoms.  No further cardiovascular testing is indicated.  We will continue with aggressive risk reduction and meds as listed.  HYPERTENSION - His blood pressure is at target.  No change in therapy.   DYSLIPIDEMIA -  He is at target as above.  No change in therapy.   PULSATILE ABDOMINAL AORTA - I will order ultrasound to rule out AAA.

## 2019-06-15 ENCOUNTER — Telehealth: Payer: Self-pay | Admitting: Cardiology

## 2019-06-15 NOTE — Telephone Encounter (Signed)
I called pt to confirm his appt for 06-16-19 with Dr Percival Spanish.        COVID-19 Pre-Screening Questions:   In the past 7 to 10 days have you had a cough,  shortness of breath, headache, congestion, fever (100 or greater) body aches, chills, sore throat, or sudden loss of taste or sense of smell?  no  Have you been around anyone with known Covid 19.  Have you been around anyone who is awaiting Covid 19 test results in the past 7 to 10 days?  no Have you been around anyone who has been exposed to Covid 19, or has mentioned symptoms of Covid 19 within the past 7 to 10 days? no If you have any concerns/questions about symptoms patients report during screening (either on the phone or at threshold). Contact the provider seeing the patient or DOD for further guidance.  If neither are available contact a member of the leadership team.

## 2019-06-16 ENCOUNTER — Other Ambulatory Visit: Payer: Self-pay

## 2019-06-16 ENCOUNTER — Encounter: Payer: Self-pay | Admitting: Cardiology

## 2019-06-16 ENCOUNTER — Ambulatory Visit (INDEPENDENT_AMBULATORY_CARE_PROVIDER_SITE_OTHER): Payer: Medicare Other | Admitting: Cardiology

## 2019-06-16 VITALS — BP 120/67 | HR 75 | Ht 71.0 in | Wt 183.6 lb

## 2019-06-16 DIAGNOSIS — I10 Essential (primary) hypertension: Secondary | ICD-10-CM

## 2019-06-16 DIAGNOSIS — I2510 Atherosclerotic heart disease of native coronary artery without angina pectoris: Secondary | ICD-10-CM | POA: Diagnosis not present

## 2019-06-16 DIAGNOSIS — R198 Other specified symptoms and signs involving the digestive system and abdomen: Secondary | ICD-10-CM | POA: Diagnosis not present

## 2019-06-16 NOTE — Patient Instructions (Signed)
Medication Instructions:  Your physician recommends that you continue on your current medications as directed. Please refer to the Current Medication list given to you today.  If you need a refill on your cardiac medications before your next appointment, please call your pharmacy.   Lab work: NONE   Testing/Procedures: Your physician has requested that you have an abdominal aorta duplex. During this test, an ultrasound is used to evaluate the aorta. Allow 30 minutes for this exam. Do not eat after midnight the day before and avoid carbonated beverages  Follow-Up: At [facility], you and your health needs are our priority.  As part of our continuing mission to provide you with exceptional heart care, we have created designated Provider Care Teams.  These Care Teams include your primary Cardiologist (physician) and Advanced Practice Providers (APPs -  Physician Assistants and Nurse Practitioners) who all work together to provide you with the care you need, when you need it. You will need a follow up appointment in 12 months.  Please call our office 2 months in advance to schedule this appointment.  You may see DR [facility]  or one of the following Advanced Practice Providers on your designated Care Team:   Rosaria Ferries, PA-C . Jory Sims, DNP, ANP

## 2019-06-24 ENCOUNTER — Other Ambulatory Visit: Payer: Self-pay

## 2019-06-24 ENCOUNTER — Ambulatory Visit (HOSPITAL_COMMUNITY)
Admission: RE | Admit: 2019-06-24 | Discharge: 2019-06-24 | Disposition: A | Discharge status: Home / Self Care | Payer: Medicare Other | Source: Ambulatory Visit | Attending: Cardiovascular Disease | Admitting: Cardiovascular Disease

## 2019-06-24 DIAGNOSIS — I10 Essential (primary) hypertension: Secondary | ICD-10-CM

## 2019-06-24 DIAGNOSIS — R198 Other specified symptoms and signs involving the digestive system and abdomen: Secondary | ICD-10-CM | POA: Diagnosis not present

## 2019-07-02 NOTE — Progress Notes (Signed)
Notes recorded by Cristopher Estimable, RN on 06/25/2019 at 2:35 PM EDT  Results forwarded  ------   Notes recorded by Cristopher Estimable, RN on 06/25/2019 at 2:35 PM EDT  pt aware of results  ------   Notes recorded by Minus Breeding, MD on 06/24/2019 at 9:22 PM EDT  No AAA. Call Mr. Wickens with the results and send results to Venia Carbon, MD

## 2019-07-30 DIAGNOSIS — L84 Corns and callosities: Secondary | ICD-10-CM | POA: Diagnosis not present

## 2019-07-30 DIAGNOSIS — L821 Other seborrheic keratosis: Secondary | ICD-10-CM | POA: Diagnosis not present

## 2019-07-30 DIAGNOSIS — D2271 Melanocytic nevi of right lower limb, including hip: Secondary | ICD-10-CM | POA: Diagnosis not present

## 2019-07-30 DIAGNOSIS — D225 Melanocytic nevi of trunk: Secondary | ICD-10-CM | POA: Diagnosis not present

## 2019-07-30 DIAGNOSIS — Z85820 Personal history of malignant melanoma of skin: Secondary | ICD-10-CM | POA: Diagnosis not present

## 2019-07-30 DIAGNOSIS — C44311 Basal cell carcinoma of skin of nose: Secondary | ICD-10-CM | POA: Diagnosis not present

## 2019-07-30 DIAGNOSIS — L111 Transient acantholytic dermatosis [Grover]: Secondary | ICD-10-CM | POA: Diagnosis not present

## 2019-07-30 DIAGNOSIS — Z85828 Personal history of other malignant neoplasm of skin: Secondary | ICD-10-CM | POA: Diagnosis not present

## 2019-07-30 DIAGNOSIS — L570 Actinic keratosis: Secondary | ICD-10-CM | POA: Diagnosis not present

## 2019-07-30 DIAGNOSIS — D485 Neoplasm of uncertain behavior of skin: Secondary | ICD-10-CM | POA: Diagnosis not present

## 2019-08-04 ENCOUNTER — Other Ambulatory Visit: Payer: Self-pay | Admitting: Cardiology

## 2019-08-04 MED ORDER — RAMIPRIL 2.5 MG PO CAPS: 2.5000 mg | ORAL_CAPSULE | Freq: Every day | ORAL | 2 refills | Status: DC

## 2019-08-04 MED ORDER — ATORVASTATIN CALCIUM 20 MG PO TABS: 20.0000 mg | ORAL_TABLET | Freq: Every day | ORAL | 2 refills | Status: DC

## 2019-08-04 NOTE — Telephone Encounter (Signed)
Requested Prescriptions   Signed Prescriptions Disp Refills  . atorvastatin (LIPITOR) 20 MG tablet 90 tablet 2    Sig: Take 1 tablet (20 mg total) by mouth daily.    Authorizing Provider: Minus Breeding    Ordering User: NEWCOMER MCCLAIN, BRANDY L  . ramipril (ALTACE) 2.5 MG capsule 90 capsule 2    Sig: Take 1 capsule (2.5 mg total) by mouth daily.    Authorizing Provider: Minus Breeding    Ordering User: Raelene Bott, BRANDY L

## 2019-08-04 NOTE — Telephone Encounter (Signed)
°*  STAT* If patient is at the pharmacy, call can be transferred to refill team.   1. Which medications need to be refilled? (please list name of each medication and dose if known) Atorvastatin, and Ramapril   2. Which pharmacy/location (including street and city if local pharmacy) is medication to be sent to? [facility]- [phone]  3. Do they need a 30 day or 90 day supply? 90 days and refills

## 2019-09-30 DIAGNOSIS — M9904 Segmental and somatic dysfunction of sacral region: Secondary | ICD-10-CM | POA: Diagnosis not present

## 2019-09-30 DIAGNOSIS — M9903 Segmental and somatic dysfunction of lumbar region: Secondary | ICD-10-CM | POA: Diagnosis not present

## 2019-09-30 DIAGNOSIS — M9905 Segmental and somatic dysfunction of pelvic region: Secondary | ICD-10-CM | POA: Diagnosis not present

## 2019-09-30 DIAGNOSIS — M5136 Other intervertebral disc degeneration, lumbar region: Secondary | ICD-10-CM | POA: Diagnosis not present

## 2019-10-01 DIAGNOSIS — M9905 Segmental and somatic dysfunction of pelvic region: Secondary | ICD-10-CM | POA: Diagnosis not present

## 2019-10-01 DIAGNOSIS — M9903 Segmental and somatic dysfunction of lumbar region: Secondary | ICD-10-CM | POA: Diagnosis not present

## 2019-10-01 DIAGNOSIS — M5136 Other intervertebral disc degeneration, lumbar region: Secondary | ICD-10-CM | POA: Diagnosis not present

## 2019-10-01 DIAGNOSIS — M9904 Segmental and somatic dysfunction of sacral region: Secondary | ICD-10-CM | POA: Diagnosis not present

## 2019-10-05 DIAGNOSIS — M5136 Other intervertebral disc degeneration, lumbar region: Secondary | ICD-10-CM | POA: Diagnosis not present

## 2019-10-05 DIAGNOSIS — M9904 Segmental and somatic dysfunction of sacral region: Secondary | ICD-10-CM | POA: Diagnosis not present

## 2019-10-05 DIAGNOSIS — M9905 Segmental and somatic dysfunction of pelvic region: Secondary | ICD-10-CM | POA: Diagnosis not present

## 2019-10-05 DIAGNOSIS — M9903 Segmental and somatic dysfunction of lumbar region: Secondary | ICD-10-CM | POA: Diagnosis not present

## 2019-11-12 ENCOUNTER — Telehealth: Payer: Self-pay | Admitting: Cardiology

## 2019-11-12 NOTE — Telephone Encounter (Signed)
Spoke with pt and informed that signed letter will be available at front desk for pick up. Pt verbalized understanding

## 2019-11-12 NOTE — Telephone Encounter (Signed)
Patient needs a letter stating he is under the care of Dr. Percival Spanish. 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 HE is hoping the letter get done today, but would need it asap.  I provided the patient with a link to register for MyChart because he is not registered yet.

## 2019-11-18 DIAGNOSIS — Z85828 Personal history of other malignant neoplasm of skin: Secondary | ICD-10-CM | POA: Diagnosis not present

## 2019-11-18 DIAGNOSIS — L570 Actinic keratosis: Secondary | ICD-10-CM | POA: Diagnosis not present

## 2019-11-18 DIAGNOSIS — Z85820 Personal history of malignant melanoma of skin: Secondary | ICD-10-CM | POA: Diagnosis not present

## 2019-12-08 DIAGNOSIS — R972 Elevated prostate specific antigen [PSA]: Secondary | ICD-10-CM | POA: Diagnosis not present

## 2019-12-14 DIAGNOSIS — H524 Presbyopia: Secondary | ICD-10-CM | POA: Diagnosis not present

## 2019-12-14 DIAGNOSIS — H0015 Chalazion left lower eyelid: Secondary | ICD-10-CM | POA: Diagnosis not present

## 2019-12-14 DIAGNOSIS — H25813 Combined forms of age-related cataract, bilateral: Secondary | ICD-10-CM | POA: Diagnosis not present

## 2019-12-14 DIAGNOSIS — H43813 Vitreous degeneration, bilateral: Secondary | ICD-10-CM | POA: Diagnosis not present

## 2020-02-25 DIAGNOSIS — B351 Tinea unguium: Secondary | ICD-10-CM | POA: Diagnosis not present

## 2020-02-25 DIAGNOSIS — L821 Other seborrheic keratosis: Secondary | ICD-10-CM | POA: Diagnosis not present

## 2020-02-25 DIAGNOSIS — Z85828 Personal history of other malignant neoplasm of skin: Secondary | ICD-10-CM | POA: Diagnosis not present

## 2020-02-25 DIAGNOSIS — Z85820 Personal history of malignant melanoma of skin: Secondary | ICD-10-CM | POA: Diagnosis not present

## 2020-02-25 DIAGNOSIS — D225 Melanocytic nevi of trunk: Secondary | ICD-10-CM | POA: Diagnosis not present

## 2020-02-25 DIAGNOSIS — L570 Actinic keratosis: Secondary | ICD-10-CM | POA: Diagnosis not present

## 2020-02-25 DIAGNOSIS — D2271 Melanocytic nevi of right lower limb, including hip: Secondary | ICD-10-CM | POA: Diagnosis not present

## 2020-02-25 DIAGNOSIS — L820 Inflamed seborrheic keratosis: Secondary | ICD-10-CM | POA: Diagnosis not present

## 2020-02-25 DIAGNOSIS — L111 Transient acantholytic dermatosis [Grover]: Secondary | ICD-10-CM | POA: Diagnosis not present

## 2020-02-25 DIAGNOSIS — L918 Other hypertrophic disorders of the skin: Secondary | ICD-10-CM | POA: Diagnosis not present

## 2020-04-25 ENCOUNTER — Other Ambulatory Visit: Payer: Self-pay | Admitting: Cardiology

## 2020-05-04 ENCOUNTER — Other Ambulatory Visit: Payer: Self-pay | Admitting: Cardiology

## 2020-06-07 ENCOUNTER — Encounter: Payer: Self-pay | Admitting: Internal Medicine

## 2020-06-07 ENCOUNTER — Other Ambulatory Visit: Payer: Self-pay

## 2020-06-07 ENCOUNTER — Ambulatory Visit (INDEPENDENT_AMBULATORY_CARE_PROVIDER_SITE_OTHER): Payer: Medicare Other | Admitting: Internal Medicine

## 2020-06-07 VITALS — BP 108/66 | HR 78 | Temp 97.5°F | Ht 70.75 in | Wt 190.0 lb

## 2020-06-07 DIAGNOSIS — Z0000 Encounter for general adult medical examination without abnormal findings: Secondary | ICD-10-CM

## 2020-06-07 DIAGNOSIS — I10 Essential (primary) hypertension: Secondary | ICD-10-CM | POA: Diagnosis not present

## 2020-06-07 DIAGNOSIS — E785 Hyperlipidemia, unspecified: Secondary | ICD-10-CM | POA: Diagnosis not present

## 2020-06-07 DIAGNOSIS — Z7189 Other specified counseling: Secondary | ICD-10-CM | POA: Diagnosis not present

## 2020-06-07 DIAGNOSIS — N400 Enlarged prostate without lower urinary tract symptoms: Secondary | ICD-10-CM

## 2020-06-07 DIAGNOSIS — I2510 Atherosclerotic heart disease of native coronary artery without angina pectoris: Secondary | ICD-10-CM | POA: Diagnosis not present

## 2020-06-07 LAB — COMPREHENSIVE METABOLIC PANEL
ALT: 17 U/L (ref 0–53)
AST: 22 U/L (ref 0–37)
Albumin: 4.2 g/dL (ref 3.5–5.2)
Alkaline Phosphatase: 71 U/L (ref 39–117)
BUN: 11 mg/dL (ref 6–23)
CO2: 31 mEq/L (ref 19–32)
Calcium: 9.3 mg/dL (ref 8.4–10.5)
Chloride: 103 mEq/L (ref 96–112)
Creatinine, Ser: 0.97 mg/dL (ref 0.40–1.50)
GFR: 76.31 mL/min (ref >60.00)
Glucose, Bld: 103 mg/dL — ABNORMAL HIGH (ref 70–99)
Potassium: 4.1 mEq/L (ref 3.5–5.1)
Sodium: 140 mEq/L (ref 135–145)
Total Bilirubin: 1 mg/dL (ref 0.2–1.2)
Total Protein: 7.2 g/dL (ref 6.0–8.3)

## 2020-06-07 LAB — CBC
HCT: 44.4 % (ref 39.0–52.0)
Hemoglobin: 14.3 g/dL (ref 13.0–17.0)
MCHC: 32.3 g/dL (ref 30.0–36.0)
MCV: 84.3 fl (ref 78.0–100.0)
Platelets: 255 10*3/uL (ref 150.0–400.0)
RBC: 5.27 Mil/uL (ref 4.22–5.81)
RDW: 14.9 % (ref 11.5–15.5)
WBC: 7.6 10*3/uL (ref 4.0–10.5)

## 2020-06-07 LAB — LIPID PANEL
Cholesterol: 121 mg/dL (ref 0–200)
HDL: 50.2 mg/dL (ref >39.00)
LDL Cholesterol: 52 mg/dL (ref 0–99)
NonHDL: 70.64
Total CHOL/HDL Ratio: 2
Triglycerides: 95 mg/dL (ref 0.0–149.0)
VLDL: 19 mg/dL (ref 0.0–40.0)

## 2020-06-07 NOTE — Assessment & Plan Note (Signed)
Mild symptoms  No Rx as yet

## 2020-06-07 NOTE — Progress Notes (Signed)
Hearing Screening   Method: Audiometry   125Hz  250Hz  500Hz  1000Hz  2000Hz  3000Hz  4000Hz  6000Hz  8000Hz   Right ear:   40 0 20  0    Left ear:   25 20 20   40    Comments: Wax in both ears  Vision Screening Comments: January 2021

## 2020-06-07 NOTE — Assessment & Plan Note (Signed)
See social history 

## 2020-06-07 NOTE — Assessment & Plan Note (Signed)
I have personally reviewed the Medicare Annual Wellness questionnaire and have noted 1. The patient's medical and social history 2. Their use of alcohol, tobacco or illicit drugs 3. Their current medications and supplements 4. The patient's functional ability including ADL's, fall risks, home safety risks and hearing or visual             impairment. 5. Diet and physical activities 6. Evidence for depression or mood disorders  The patients weight, height, BMI and visual acuity have been recorded in the chart I have made referrals, counseling and provided education to the patient based review of the above and I have provided the pt with a written personalized care plan for preventive services.  I have provided you with a copy of your personalized plan for preventive services. Please take the time to review along with your updated medication list.  Colon due 2024 PSA with Dr Amalia Hailey Discussed resistance training Flu vaccine in the fall--none recently Td booster next year

## 2020-06-07 NOTE — Assessment & Plan Note (Signed)
On statin.

## 2020-06-07 NOTE — Assessment & Plan Note (Signed)
No angina On satin, ACEI, ASA

## 2020-06-07 NOTE — Progress Notes (Signed)
Subjective:    Patient ID: Juan [name], male    DOB: May 09, 1951, 69 y.o.   MRN: [id number]  HPI Here for Medicare wellness visit and follow up of chronic health conditions This visit occurred during the SARS-CoV-2 public health emergency.  Safety protocols were in place, including screening questions prior to the visit, additional usage of staff PPE, and extensive cleaning of exam room while observing appropriate contact time as indicated for disinfecting solutions.   Reviewed form and advanced directives Reviewed other doctors No tobacco Still enjoys beer or bourbon 3 times per week or so Walks some --stays active (job, etc). Signed up for Silver Sneakers No falls No depression or anhedonia Vision is slipping sllightly--keeps up with eye doctor (just prescription) No hearing problems Independent with instrumental ADLs No sig memory issues  Working less More golf Tries to walk the course when not so hot  No chest pain or SOB No dizziness or syncope--other than some dizziness briefly after bending No palpitations No edema No headaches  Continues to see urologist No sig urgency Flow is okay Nocturia x 2 usually  No heartburn issues  Current Outpatient Medications on File Prior to Visit  Medication Sig Dispense Refill  . aspirin EC 81 MG tablet Take 81 mg by mouth daily.    Marland Kitchen atorvastatin (LIPITOR) 20 MG tablet TAKE 1 TABLET BY MOUTH EVERY DAY 90 tablet 2  . ramipril (ALTACE) 2.5 MG capsule TAKE 1 CAPSULE BY MOUTH EVERY DAY 90 capsule 1  . tretinoin (RETIN-A) 0.025 % cream Apply 1 application topically daily.     No current facility-administered medications on file prior to visit.    No Known Allergies  Past Medical History:  Diagnosis Date  . CAD (coronary artery disease)    a. RCA occlusion with 2 drug-eluting stents 2004;  b. 90% stenosis distal to the stents in the same artery treated with drug-eluting stenting February 2012;  .c 12/2013 Cath: nonobs dzs.  .  Complication of anesthesia   . Diverticulosis of colon   . GERD (gastroesophageal reflux disease)   . Hyperlipidemia   . Hypertension   . Melanoma ([place])   . Myocardial infarction ([place]) 2004  . PONV (postoperative nausea and vomiting)    Age 56- not since  . Shortness of breath dyspnea    with extertion    Past Surgical History:  Procedure Laterality Date  . COLONOSCOPY    . ESOPHAGOSCOPY N/A 05/31/2015   Procedure: ESOPHAGOSCOPY;  Surgeon: Izora Gala, MD;  Location: [place];  Service: ENT;  Laterality: N/A;  . LEFT HEART CATHETERIZATION WITH CORONARY ANGIOGRAM N/A 12/14/2013   Procedure: LEFT HEART CATHETERIZATION WITH CORONARY ANGIOGRAM;  Surgeon: Sinclair Grooms, MD;  [facility] C[facility] LAB;  Service: Cardiovascular;  Laterality: N/A;  . MELANOMA EXCISION     3 seperate ones  . PTCA  2004  . wisdom teeth removal      Family History  Problem Relation Age of Onset  . Heart attack Father 16       died  . Stroke Mother   . Hypertension Mother   . Hypertension Brother   . Cancer Brother        prostate  . Colon cancer Neg Hx   . Pancreatic cancer Neg Hx   . Stomach cancer Neg Hx   . Esophageal cancer Neg Hx   . Rectal cancer Neg Hx   . Diabetes Neg Hx     Social History   Socioeconomic  History  . Marital status: Married    Spouse name: Not on file  . Number of children: 1  . Years of education: Not on file  . Highest education level: Not on file  Occupational History  . 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 Smoking status: Never Smoker  . Smokeless tobacco: Never Used  Vaping Use  . Vaping Use: Never used  Substance and Sexual Activity  . Alcohol use: Yes    Alcohol/week: 1.0 standard drink    Types: 1 Cans of beer per week  . Drug use: No  . Sexual activity: Not on file  Other Topics Concern  . Not on file  Social History Narrative   No living will or health care POA   Would want wife to make  decisions--then daughter   Would accept resuscitation attempts   Would accept at least short term trial of tube feeds   Social Determinants of Health   Financial Resource Strain:   . Difficulty of Paying Living Expenses:   Food Insecurity:   . Worried About Charity fundraiser in the Last Year:   . Arboriculturist in the Last Year:   Transportation Needs:   . Film/video editor (Medical):   Marland Kitchen Lack of Transportation (Non-Medical):   Physical Activity:   . Days of Exercise per Week:   . Minutes of Exercise per Session:   Stress:   . Feeling of Stress :   Social Connections:   . Frequency of Communication with Friends and Family:   . Frequency of Social Gatherings with Friends and Family:   . Attends Religious Services:   . Active Member of Clubs or Organizations:   . Attends Archivist Meetings:   Marland Kitchen Marital Status:   Intimate Partner Violence:   . Fear of Current or Ex-Partner:   . Emotionally Abused:   Marland Kitchen Physically Abused:   . Sexually Abused:    Review of Systems  Appetite is good Weight up a bit--going out to lunch more Sleeps fine Wears seat belt Teeth okay--keeps up with dentist No suspicious skin lesions Bowels are fine--no blood Some back pain---better after a few adjustments at chiropractor No other joint issues    Objective:   Physical Exam Constitutional:      General: He is not in acute distress.    Appearance: Normal appearance.  HENT:     Head: Normocephalic and atraumatic.     Mouth/Throat:     Mouth: Mucous membranes are moist.     Comments: No lesions Cardiovascular:     Rate and Rhythm: Normal rate and regular rhythm.     Pulses: Normal pulses.     Heart sounds: No murmur heard.  No gallop.   Pulmonary:     Effort: Pulmonary effort is normal.     Breath sounds: Normal breath sounds. No wheezing or rales.  Abdominal:     Palpations: Abdomen is soft.     Tenderness: There is no abdominal tenderness.  Musculoskeletal:      Cervical back: Neck supple.     Right lower leg: No edema.     Left lower leg: No edema.  Lymphadenopathy:     Cervical: No cervical adenopathy.  Skin:    General: Skin is warm.     Findings: No rash.  Neurological:     Mental Status: He is alert and oriented to person, place, and time.  Comments: President--- "Waymond Cera, Barack Obama" [phone] D-l-r-o-w Recall 3/3  Psychiatric:        Mood and Affect: Mood normal.        Behavior: Behavior normal.            Assessment & Plan:

## 2020-06-07 NOTE — Assessment & Plan Note (Signed)
BP Readings from Last 3 Encounters:  06/07/20 108/66  06/16/19 120/67  06/03/19 110/72   Good control--ramipril Will check labs

## 2020-06-14 NOTE — Progress Notes (Signed)
Cardiology Office Note   Date:  06/16/2020   ID:  Juan [name], DOB 12-24-1950, MRN [id number]  PCP:  Venia Carbon, MD  Cardiologist:   No primary care provider on file.   Chief Complaint  Patient presents with  . Coronary Artery Disease      History of Present Illness: Juan [name] is a 69 y.o. male who presents for follow up of CAD.  Since I last saw him he has done very well. He golfs and pulls to golf bag behind him several days a week. The patient denies any new symptoms such as chest discomfort, neck or arm discomfort. There has been no new shortness of breath, PND or orthopnea. There have been no reported palpitations, presyncope or syncope.     Past Medical History:  Diagnosis Date  . CAD (coronary artery disease)    a. RCA occlusion with 2 drug-eluting stents 2004;  b. 90% stenosis distal to the stents in the same artery treated with drug-eluting stenting February 2012;  .c 12/2013 Cath: nonobs dzs.  . Complication of anesthesia   . Diverticulosis of colon   . GERD (gastroesophageal reflux disease)   . Hyperlipidemia   . Hypertension   . Melanoma ([place])   . Myocardial infarction ([place]) 2004  . PONV (postoperative nausea and vomiting)    Age 32- not since    Past Surgical History:  Procedure Laterality Date  . COLONOSCOPY    . ESOPHAGOSCOPY N/A 05/31/2015   Procedure: ESOPHAGOSCOPY;  Surgeon: Izora Gala, MD;  Location: [facility];  Service: ENT;  Laterality: N/A;  . LEFT HEART CATHETERIZATION WITH CORONARY ANGIOGRAM N/A 12/14/2013   Procedure: LEFT HEART CATHETERIZATION WITH CORONARY ANGIOGRAM;  Surgeon: Sinclair Grooms, MD;  Location: [facility] CATH LAB;  Service: Cardiovascular;  Laterality: N/A;  . MELANOMA EXCISION     3 seperate ones  . PTCA  2004  . wisdom teeth removal       Current Outpatient Medications  Medication Sig Dispense Refill  . aspirin EC 81 MG tablet Take 81 mg by mouth daily.    Marland Kitchen atorvastatin (LIPITOR) 20 MG tablet TAKE 1 TABLET BY MOUTH  EVERY DAY 90 tablet 2  . ramipril (ALTACE) 2.5 MG capsule TAKE 1 CAPSULE BY MOUTH EVERY DAY 90 capsule 1   No current facility-administered medications for this visit.    Allergies:   Patient has no known allergies.    ROS:  Please see the history of present illness.   Otherwise, review of systems are positive for none.   All other systems are reviewed and negative.    PHYSICAL EXAM: VS:  BP 114/62   Pulse 64   Ht 5\' 11"  (1.803 m)   Wt 191 lb 6.4 oz (86.8 kg)   SpO2 97%   BMI 26.69 kg/m  , BMI Body mass index is 26.69 kg/m. GENERAL:  Well appearing NECK:  No jugular venous distention, waveform within normal limits, carotid upstroke brisk and symmetric, no bruits, no thyromegaly LUNGS:  Clear to auscultation bilaterally CHEST:  Unremarkable HEART:  PMI not displaced or sustained,S1 and S2 within normal limits, no S3, no S4, no clicks, no rubs, no murmurs ABD:  Flat, positive bowel sounds normal in frequency in pitch, no bruits, no rebound, no guarding, no midline pulsatile mass, no hepatomegaly, no splenomegaly EXT:  2 plus pulses throughout, no edema, no cyanosis no clubbing   EKG:  EKG is ordered today. The ekg ordered today demonstrates normal  sinus rhythm, rate 64, axis within normal limits, intervals within normal limits, no acute ST-T wave changes.   Recent Labs: 06/07/2020: ALT 17; BUN 11; Creatinine, Ser 0.97; Hemoglobin 14.3; Platelets 255.0; Potassium 4.1; Sodium 140    Lipid Panel    Component Value Date/Time   CHOL 121 06/07/2020 0949   TRIG 95.0 06/07/2020 0949   HDL 50.20 06/07/2020 0949   CHOLHDL 2 06/07/2020 0949   VLDL 19.0 06/07/2020 0949   LDLCALC 52 06/07/2020 0949      Wt Readings from Last 3 Encounters:  06/16/20 191 lb 6.4 oz (86.8 kg)  06/07/20 190 lb (86.2 kg)  06/16/19 183 lb 9.6 oz (83.3 kg)      Other studies Reviewed: Additional studies/ records that were reviewed today include: Labs. Review of the above records demonstrates:   Please see elsewhere in the note.     ASSESSMENT AND PLAN:  CORONARY ARTERY DISEASE -  The patient has no new sypmtoms.  No further cardiovascular testing is indicated.  We will continue with aggressive risk reduction and meds as listed.  HYPERTENSION - His blood pressure is at target. No change in therapy.  DYSLIPIDEMIA -  His LDL was 52 with an HDL of 50. No change in therapy.   Current medicines are reviewed at length with the patient today.  The patient does not have concerns regarding medicines.  The following changes have been made:  no change  Labs/ tests ordered today include: None  Orders Placed This Encounter  Procedures  . EKG 12-Lead     Disposition:   FU with me in one year.     Signed, Minus Breeding, MD  06/16/2020 12:47 PM    [facility]

## 2020-06-16 ENCOUNTER — Other Ambulatory Visit: Payer: Self-pay

## 2020-06-16 ENCOUNTER — Ambulatory Visit (INDEPENDENT_AMBULATORY_CARE_PROVIDER_SITE_OTHER): Payer: Medicare Other | Admitting: Cardiology

## 2020-06-16 ENCOUNTER — Encounter: Payer: Self-pay | Admitting: Cardiology

## 2020-06-16 VITALS — BP 114/62 | HR 64 | Ht 71.0 in | Wt 191.4 lb

## 2020-06-16 DIAGNOSIS — I2510 Atherosclerotic heart disease of native coronary artery without angina pectoris: Secondary | ICD-10-CM

## 2020-06-16 DIAGNOSIS — I10 Essential (primary) hypertension: Secondary | ICD-10-CM

## 2020-06-16 DIAGNOSIS — E785 Hyperlipidemia, unspecified: Secondary | ICD-10-CM | POA: Diagnosis not present

## 2020-06-16 NOTE — Patient Instructions (Signed)
Medication Instructions:  NO CHANGE *If you need a refill on your cardiac medications before your next appointment, please call your pharmacy*   Lab Work: If you have labs (blood work) drawn today and your tests are completely normal, you will receive your results only by: Marland Kitchen MyChart Message (if you have MyChart) OR . A paper copy in the mail If you have any lab test that is abnormal or we need to change your treatment, we will call you to review the results   Follow-Up: At [facility], you and your health needs are our priority.  As part of our continuing mission to provide you with exceptional heart care, we have created designated Provider Care Teams.  These Care Teams include your primary Cardiologist (physician) and Advanced Practice Providers (APPs -  Physician Assistants and Nurse Practitioners) who all work together to provide you with the care you need, when you need it.  We recommend signing up for the patient portal called "MyChart".  Sign up information is provided on this After Visit Summary.  MyChart is used to connect with patients for Virtual Visits (Telemedicine).  Patients are able to view lab/test results, encounter notes, upcoming appointments, etc.  Non-urgent messages can be sent to your provider as well.   To learn more about what you can do with MyChart, go to [email or web address].    Your next appointment:   12 month(s)  The format for your next appointment:   In Person  Provider:   You may see Minus Breeding MD or one of the following Advanced Practice Providers on your designated Care Team:    Rosaria Ferries, PA-C  Jory Sims, DNP, ANP  Cadence Kathlen Mody, PA-C

## 2020-08-31 DIAGNOSIS — D2271 Melanocytic nevi of right lower limb, including hip: Secondary | ICD-10-CM | POA: Diagnosis not present

## 2020-08-31 DIAGNOSIS — L111 Transient acantholytic dermatosis [Grover]: Secondary | ICD-10-CM | POA: Diagnosis not present

## 2020-08-31 DIAGNOSIS — L814 Other melanin hyperpigmentation: Secondary | ICD-10-CM | POA: Diagnosis not present

## 2020-08-31 DIAGNOSIS — D485 Neoplasm of uncertain behavior of skin: Secondary | ICD-10-CM | POA: Diagnosis not present

## 2020-08-31 DIAGNOSIS — Z85820 Personal history of malignant melanoma of skin: Secondary | ICD-10-CM | POA: Diagnosis not present

## 2020-08-31 DIAGNOSIS — D0462 Carcinoma in situ of skin of left upper limb, including shoulder: Secondary | ICD-10-CM | POA: Diagnosis not present

## 2020-08-31 DIAGNOSIS — L570 Actinic keratosis: Secondary | ICD-10-CM | POA: Diagnosis not present

## 2020-08-31 DIAGNOSIS — Z85828 Personal history of other malignant neoplasm of skin: Secondary | ICD-10-CM | POA: Diagnosis not present

## 2020-08-31 DIAGNOSIS — L821 Other seborrheic keratosis: Secondary | ICD-10-CM | POA: Diagnosis not present

## 2020-08-31 DIAGNOSIS — D2272 Melanocytic nevi of left lower limb, including hip: Secondary | ICD-10-CM | POA: Diagnosis not present

## 2020-10-07 DIAGNOSIS — H10502 Unspecified blepharoconjunctivitis, left eye: Secondary | ICD-10-CM | POA: Diagnosis not present

## 2020-10-17 ENCOUNTER — Telehealth: Payer: Self-pay | Admitting: *Deleted

## 2020-10-17 NOTE — Telephone Encounter (Signed)
Spoke to pt. He will give it all a try. Will let us know if he continues to have an issue.

## 2020-10-17 NOTE — Telephone Encounter (Signed)
Patient called stating that he thinks that he may be having a flare-up with allergies. Patient stated that he has had head congestion and a slight sore throat for about 10 days. Patient stated that he has not really felt that bad. Patient denies a fever, chills, SOB or difficulty breathing. Patient stated that he feels like he has fluid in his ears also. Patient stated that he took benadryl once and then took some OTC Allegra from Thursday thru Saturday. Patient wants to know if Dr. Silvio Pate can recommend something over the counter for his symptoms or does he feel that he needs to be seen? Patient was advised that unfortunately with his symptoms that I doubt that we can bring him into the office.

## 2020-10-17 NOTE — Telephone Encounter (Signed)
It is somewhat unusual to have allergies starting right now---since the ragweed season started 2 months ago. It could be mold though. I like the allegra---to be taken daily. He may want to try a second antihistamine--like cetirzine--in addition if that is not enough. Fluticasone (flonase) nasal spray can also help--but may take a few days to a week to kick in. He should consider that mild COVID infections can present in this way--he may need to consider getting tested

## 2020-11-14 ENCOUNTER — Telehealth: Payer: Self-pay | Admitting: Cardiology

## 2020-11-14 MED ORDER — RAMIPRIL 2.5 MG PO CAPS: ORAL_CAPSULE | ORAL | 3 refills | Status: DC

## 2020-11-14 NOTE — Telephone Encounter (Signed)
*  STAT* If patient is at the pharmacy, call can be transferred to refill team.   1. Which medications need to be refilled? (please list name of each medication and dose if known) ramipril (ALTACE) 2.5 MG capsule  2. Which pharmacy/location (including street and city if local pharmacy) is medication to be sent to? [id number] - [place], [place] - [address]  3. Do they need a 30 day or 90 day supply? 90   Patient states that his pharmacy has been trying to contact us since 12/01 with no repsonse. Patient only has 3 tablets left.

## 2020-12-05 DIAGNOSIS — R972 Elevated prostate specific antigen [PSA]: Secondary | ICD-10-CM | POA: Diagnosis not present

## 2020-12-05 DIAGNOSIS — Z87438 Personal history of other diseases of male genital organs: Secondary | ICD-10-CM | POA: Diagnosis not present

## 2020-12-05 IMAGING — DX DG FOOT COMPLETE 3+V*R*
3 series · 3 of 3 positions shown · non-contrast
Comparison: None.

CLINICAL DATA: Right heel pain for the past day.

EXAM:
RIGHT FOOT COMPLETE - 3+ VIEW

[foot ap]
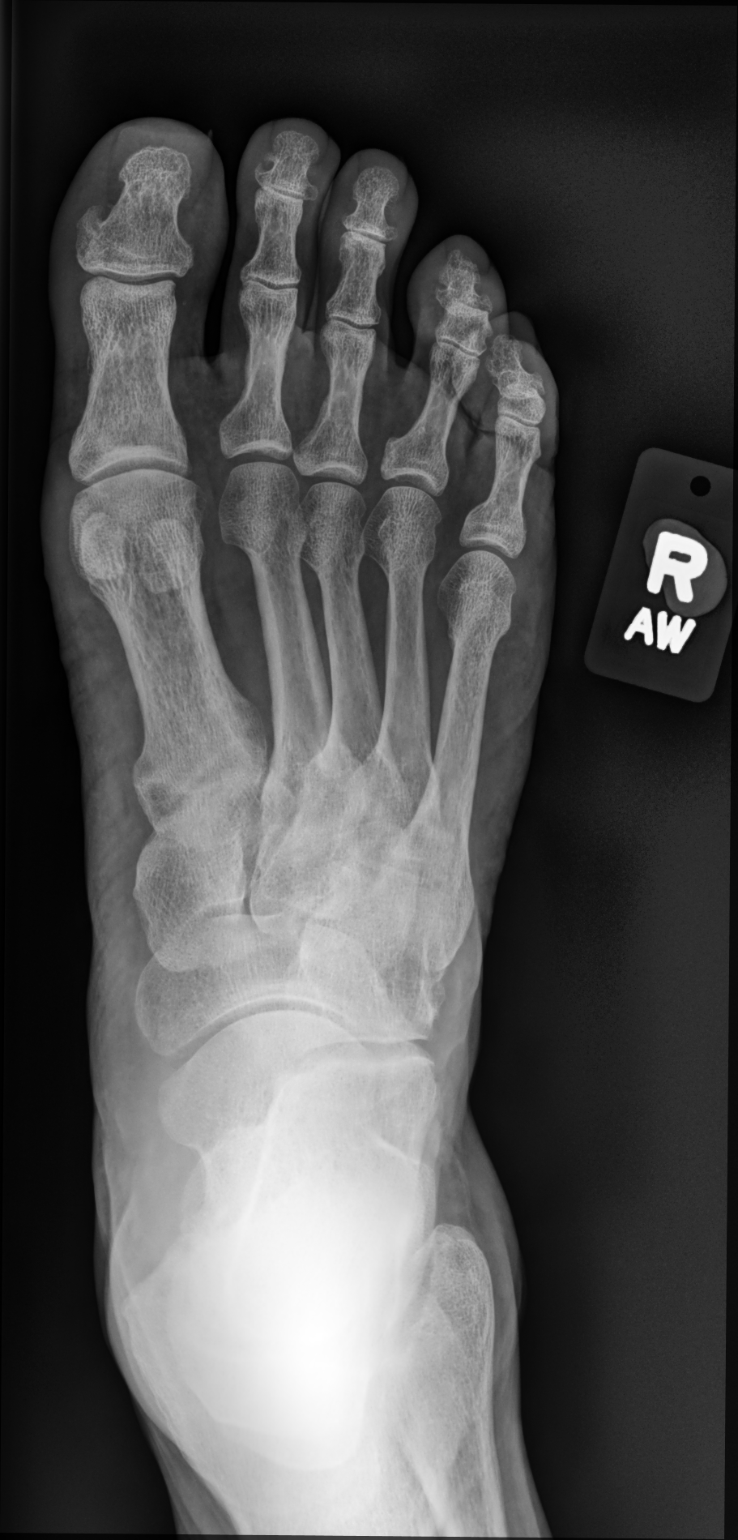

[foot mlo]
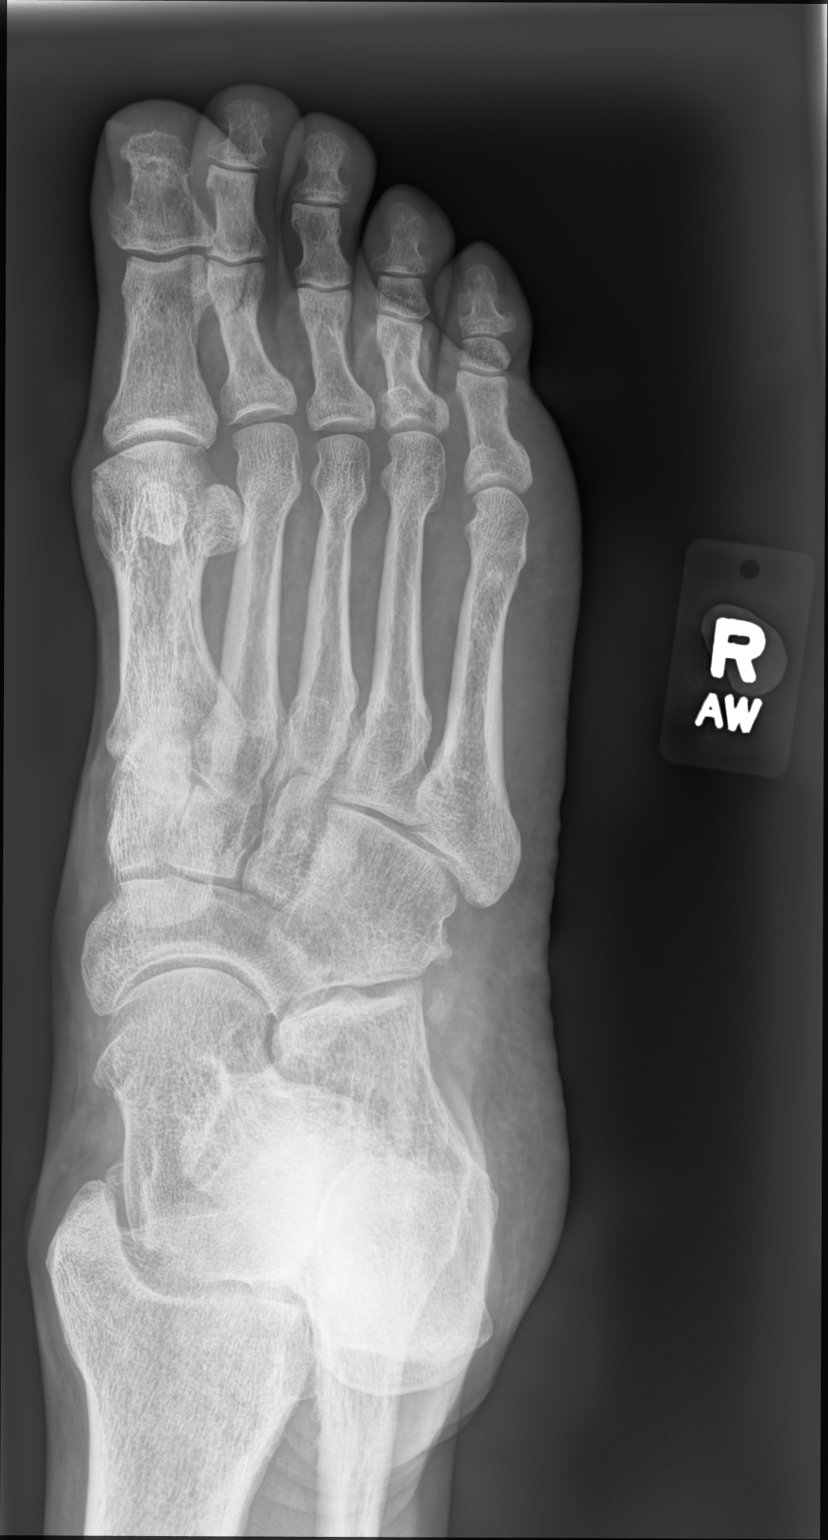

[foot lat]
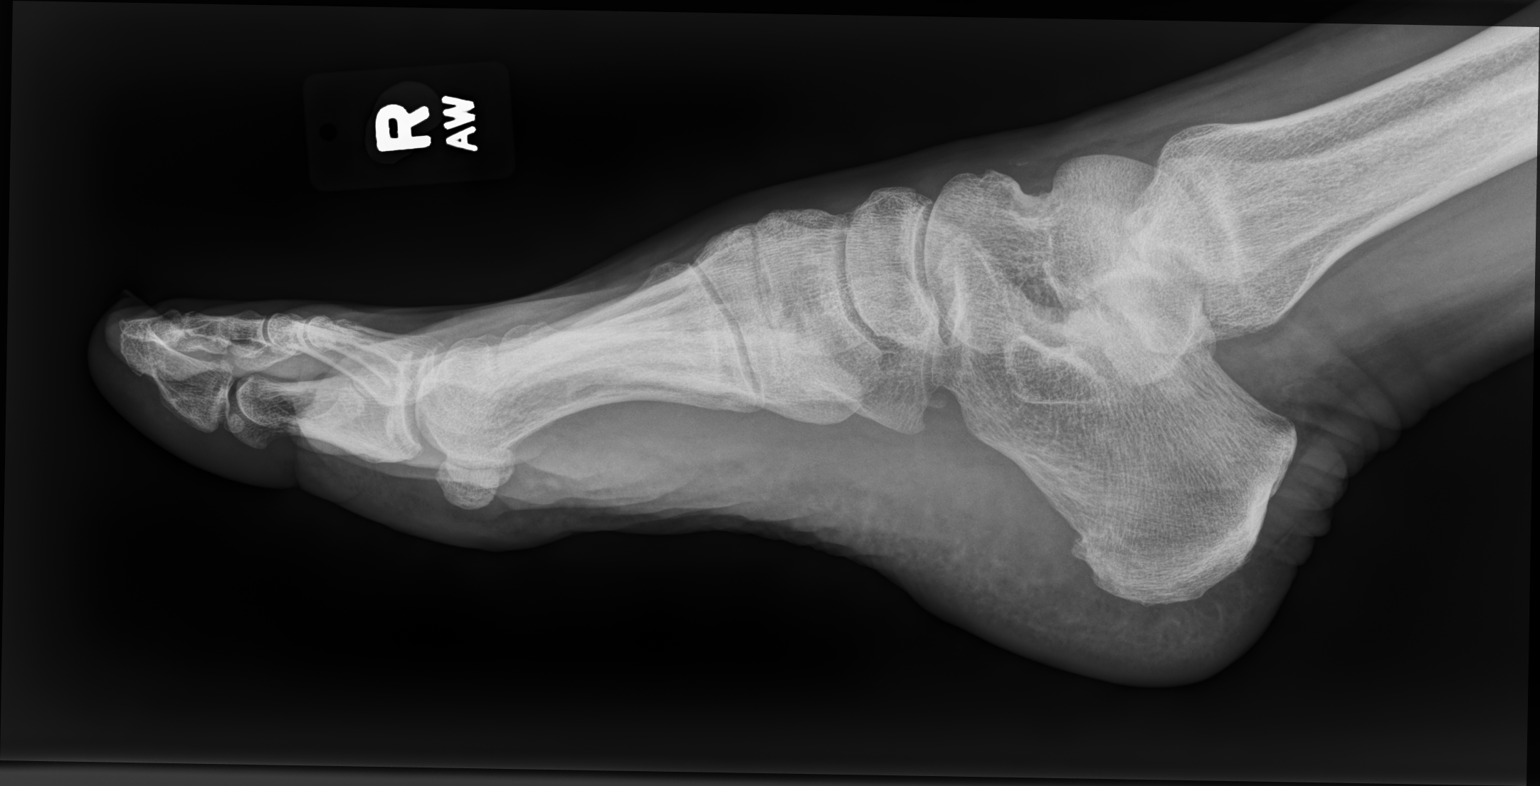

[3 of 3 positions shown; findings below may reference images not displayed]

FINDINGS: No acute fracture or dislocation. Joint spaces are preserved. Mild
osteopenia. Tiny plantar enthesophyte. Soft tissues are
unremarkable.
IMPRESSION: 1. No acute osseous abnormality or significant degenerative changes.
2. Tiny plantar enthesophyte.

## 2021-01-02 DIAGNOSIS — D23122 Other benign neoplasm of skin of left lower eyelid, including canthus: Secondary | ICD-10-CM | POA: Diagnosis not present

## 2021-01-02 DIAGNOSIS — L821 Other seborrheic keratosis: Secondary | ICD-10-CM | POA: Diagnosis not present

## 2021-01-24 ENCOUNTER — Telehealth: Payer: Self-pay | Admitting: Cardiology

## 2021-01-24 NOTE — Telephone Encounter (Signed)
Spoke with patient and relayed recommendations from Dr. Percival Spanish. Patient verbalized understanding.

## 2021-01-24 NOTE — Telephone Encounter (Signed)
There is really not any blood work that would "make sure everything is OK."  He needs to present to ED if this happens again.  If he was assessed and there were no acute changes and symptoms have completely resolved he can be seen as scheduled.  However, again, if symptoms recur he needs to be seen acutely.  Juan [name]

## 2021-01-24 NOTE — Telephone Encounter (Signed)
.  Pt c/o of Chest Pain: STAT if CP now or developed within 24 hours  1. Are you having CP right now? No- it was like a sensation right benwath his chest and above his stomach, running all the way across  2. Are you experiencing any other symptoms (ex. SOB, nausea, vomiting, sweating)? Little short of breath  3. How long have you been experiencing CP? Around 7:30 last night- he called EMS  4. Is your CP continuous or coming and going?  Had one episode that lasted for 15 or 20 minutes  5. Have you taken Nitroglycerin? No, took a goody- pt would like to be seen today if possible please ?

## 2021-01-24 NOTE — Telephone Encounter (Signed)
Spoke with patient. Patient reports he had an episode yesterday when driving to his daughter's house. He became flushed, uneasy and had a funny feeling. There was an awareness that something wasn't right. It wasn't a pain or pressure but a feeling that ran above his stomach and below his chest. It lasted 15-20 minutes. His daughter called 911 and they checked his vitals and ran an EKG all of which were normal. Patient also took a goody powder and felt better afterwards. He would like to have some lab work done to make sure everything is okay. Appointment made for next available office visit which is 02/04/20 with Dr. Percival Spanish. Will route to MD for review.

## 2021-02-01 ENCOUNTER — Other Ambulatory Visit: Payer: Self-pay

## 2021-02-01 MED ORDER — ATORVASTATIN CALCIUM 20 MG PO TABS: 20.0000 mg | ORAL_TABLET | Freq: Every day | ORAL | 2 refills | Status: DC

## 2021-02-02 NOTE — Progress Notes (Signed)
Cardiology Office Note   Date:  02/03/2021   ID:  [name], Juan May 16, 1951, MRN [id number]  PCP:  Venia Carbon, MD  Cardiologist:   No primary care provider on file.   No chief complaint on file.     History of Present Illness: Juan [name] is a 70 y.o. male who presents for follow up of CAD.  Since I last saw him he called recently after he had an "uneasy feeling."  He said that he felt a discomfort under his bilateral ribs.  It was not like his previous angina.  There was no arm or neck discomfort.  He was going to see his daughter.  When he got into the room with his family he felt flushed and they noted that he did not look right.  They actually called 911.  He said he was checked out and found to be okay with a blood pressure 947 systolic he recalls but he does not remember any of the other details.  He been feeling okay since then and before then.  He has not been having any symptoms reminiscent of his previous angina.  He has not been having any substernal chest pressure, neck or arm discomfort.  He has had no new shortness of breath, PND or orthopnea.  He has had no weight gain or edema.   Past Medical History:  Diagnosis Date  . CAD (coronary artery disease)    a. RCA occlusion with 2 drug-eluting stents 2004;  b. 90% stenosis distal to the stents in the same artery treated with drug-eluting stenting February 2012;  .c 12/2013 Cath: nonobs dzs.  . Complication of anesthesia   . Diverticulosis of colon   . GERD (gastroesophageal reflux disease)   . Hyperlipidemia   . Hypertension   . Melanoma ([place])   . Myocardial infarction ([place]) 2004  . PONV (postoperative nausea and vomiting)    Age 14- not since    Past Surgical History:  Procedure Laterality Date  . COLONOSCOPY    . ESOPHAGOSCOPY N/A 05/31/2015   Procedure: ESOPHAGOSCOPY;  Surgeon: Izora Gala, MD;  Location: [facility];  Service: ENT;  Laterality: N/A;  . LEFT HEART CATHETERIZATION WITH CORONARY ANGIOGRAM N/A  12/14/2013   Procedure: LEFT HEART CATHETERIZATION WITH CORONARY ANGIOGRAM;  Surgeon: Sinclair Grooms, MD;  Location: [facility] CATH LAB;  Service: Cardiovascular;  Laterality: N/A;  . MELANOMA EXCISION     3 seperate ones  . PTCA  2004  . wisdom teeth removal       Current Outpatient Medications  Medication Sig Dispense Refill  . aspirin EC 81 MG tablet Take 81 mg by mouth daily.    Marland Kitchen atorvastatin (LIPITOR) 20 MG tablet Take 1 tablet (20 mg total) by mouth daily. 90 tablet 2  . ramipril (ALTACE) 2.5 MG capsule TAKE 1 CAPSULE BY MOUTH EVERY DAY 90 capsule 3   No current facility-administered medications for this visit.    Allergies:   Patient has no known allergies.    ROS:  Please see the history of present illness.   Otherwise, review of systems are positive for none.   All other systems are reviewed and negative.    PHYSICAL EXAM: VS:  BP 128/74   Pulse 72   Ht 5\' 11"  (1.803 m)   Wt 198 lb 9.6 oz (90.1 kg)   SpO2 96%   BMI 27.70 kg/m  , BMI Body mass index is 27.7 kg/m. GENERAL:  Well appearing  NECK:  No jugular venous distention, waveform within normal limits, carotid upstroke brisk and symmetric, no bruits, no thyromegaly LUNGS:  Clear to auscultation bilaterally CHEST:  Unremarkable HEART:  PMI not displaced or sustained,S1 and S2 within normal limits, no S3, no S4, no clicks, no rubs, no murmurs ABD:  Flat, positive bowel sounds normal in frequency in pitch, no bruits, no rebound, no guarding, no midline pulsatile mass, no hepatomegaly, no splenomegaly EXT:  2 plus pulses throughout, no edema, no cyanosis no clubbing     EKG:  EKG is not ordered today. The ekg ordered today demonstrates normal sinus rhythm, rate 72, axis within normal limits, intervals within normal limits, no acute ST-T wave changes.   Recent Labs: 06/07/2020: ALT 17; BUN 11; Creatinine, Ser 0.97; Hemoglobin 14.3; Platelets 255.0; Potassium 4.1; Sodium 140    Lipid Panel    Component Value  Date/Time   CHOL 121 06/07/2020 0949   TRIG 95.0 06/07/2020 0949   HDL 50.20 06/07/2020 0949   CHOLHDL 2 06/07/2020 0949   VLDL 19.0 06/07/2020 0949   LDLCALC 52 06/07/2020 0949      Wt Readings from Last 3 Encounters:  02/03/21 198 lb 9.6 oz (90.1 kg)  06/16/20 191 lb 6.4 oz (86.8 kg)  06/07/20 190 lb (86.2 kg)      Other studies Reviewed: Additional studies/ records that were reviewed today include: None. Review of the above records demonstrates:  Please see elsewhere in the note.     ASSESSMENT AND PLAN:  CORONARY ARTERY DISEASE -  The patient's symptoms are vague.  However, he has known coronary disease. POET (Plain Old Exercise Treadmill)   HYPERTENSION - His blood pressure is at target.  No change in therapy.   DYSLIPIDEMIA -  His LDL was 52 previously.  No change in therapy.  I will repeat a lipid profile.    Current medicines are reviewed at length with the patient today.  The patient does not have concerns regarding medicines.  The following changes have been made:  no change  Labs/ tests ordered today include:  Orders Placed This Encounter  Procedures  . Comprehensive metabolic panel  . Lipid panel  . CBC  . EXERCISE TOLERANCE TEST (ETT)  . EKG 12-Lead     Disposition:   FU with me in one year.     Signed, Minus Breeding, MD  02/03/2021 10:13 AM    [place]

## 2021-02-03 ENCOUNTER — Encounter: Payer: Self-pay | Admitting: Cardiology

## 2021-02-03 ENCOUNTER — Ambulatory Visit (INDEPENDENT_AMBULATORY_CARE_PROVIDER_SITE_OTHER): Payer: Medicare Other | Admitting: Cardiology

## 2021-02-03 ENCOUNTER — Other Ambulatory Visit: Payer: Self-pay

## 2021-02-03 VITALS — BP 128/74 | HR 72 | Ht 71.0 in | Wt 198.6 lb

## 2021-02-03 DIAGNOSIS — I10 Essential (primary) hypertension: Secondary | ICD-10-CM | POA: Diagnosis not present

## 2021-02-03 DIAGNOSIS — E785 Hyperlipidemia, unspecified: Secondary | ICD-10-CM

## 2021-02-03 DIAGNOSIS — I2510 Atherosclerotic heart disease of native coronary artery without angina pectoris: Secondary | ICD-10-CM

## 2021-02-03 NOTE — Patient Instructions (Addendum)
Medication Instructions:  No changes *If you need a refill on your cardiac medications before your next appointment, please call your pharmacy*  Lab Work: Your physician recommends that you return for lab work today: CMP, Lipids, CBC If you have labs (blood work) drawn today and your tests are completely normal, you will receive your results only by: Marland Kitchen MyChart Message (if you have MyChart) OR . A paper copy in the mail If you have any lab test that is abnormal or we need to change your treatment, we will call you to review the results.  Testing/Procedures: Your physician has requested that you have an exercise tolerance test. For further information please visit [email or web address]. Please also follow instruction sheet, as given.  You will need a covid screening 3 days before your stress test. You will need to quarantine until your procedure.  Follow-Up: At [facility], you and your health needs are our priority.  As part of our continuing mission to provide you with exceptional heart care, we have created designated Provider Care Teams.  These Care Teams include your primary Cardiologist (physician) and Advanced Practice Providers (APPs -  Physician Assistants and Nurse Practitioners) who all work together to provide you with the care you need, when you need it.  We recommend signing up for the patient portal called "MyChart".  Sign up information is provided on this After Visit Summary.  MyChart is used to connect with patients for Virtual Visits (Telemedicine).  Patients are able to view lab/test results, encounter notes, upcoming appointments, etc.  Non-urgent messages can be sent to your provider as well.   To learn more about what you can do with MyChart, go to [email or web address].    Your next appointment:   12 month(s) You will receive a reminder letter in the mail two months in advance. If you don't receive a letter, please call our office to schedule the follow-up  appointment.  The format for your next appointment:   In Person  Provider:   Minus Breeding, MD

## 2021-02-04 LAB — CBC
Hematocrit: 48.6 % (ref 37.5–51.0)
Hemoglobin: 15.3 g/dL (ref 13.0–17.7)
MCH: 27 pg (ref 26.6–33.0)
MCHC: 31.5 g/dL (ref 31.5–35.7)
MCV: 86 fL (ref 79–97)
Platelets: 324 10*3/uL (ref 150–450)
RBC: 5.67 x10E6/uL (ref 4.14–5.80)
RDW: 13.8 % (ref 11.6–15.4)
WBC: 8.4 10*3/uL (ref 3.4–10.8)

## 2021-02-04 LAB — COMPREHENSIVE METABOLIC PANEL
ALT: 18 IU/L (ref 0–44)
AST: 18 IU/L (ref 0–40)
Albumin/Globulin Ratio: 1.5 (ref 1.2–2.2)
Albumin: 4.3 g/dL (ref 3.7–4.7)
Alkaline Phosphatase: 94 IU/L (ref 44–121)
BUN/Creatinine Ratio: 11 (ref 10–24)
BUN: 10 mg/dL (ref 8–27)
Bilirubin Total: 0.5 mg/dL (ref 0.0–1.2)
CO2: 26 mmol/L (ref 20–29)
Calcium: 9.5 mg/dL (ref 8.6–10.2)
Chloride: 102 mmol/L (ref 96–106)
Creatinine, Ser: 0.95 mg/dL (ref 0.76–1.27)
Globulin, Total: 2.8 g/dL (ref 1.5–4.5)
Glucose: 98 mg/dL (ref 65–99)
Potassium: 5 mmol/L (ref 3.5–5.2)
Sodium: 141 mmol/L (ref 134–144)
Total Protein: 7.1 g/dL (ref 6.0–8.5)
eGFR: 86 mL/min/{1.73_m2} (ref >59)

## 2021-02-04 LAB — LIPID PANEL
Chol/HDL Ratio: 2.5 ratio (ref 0.0–5.0)
Cholesterol, Total: 127 mg/dL (ref 100–199)
HDL: 50 mg/dL (ref >39)
LDL Chol Calc (NIH): 62 mg/dL (ref 0–99)
Triglycerides: 75 mg/dL (ref 0–149)
VLDL Cholesterol Cal: 15 mg/dL (ref 5–40)

## 2021-02-10 ENCOUNTER — Telehealth (HOSPITAL_COMMUNITY): Payer: Self-pay | Admitting: Cardiology

## 2021-02-10 NOTE — Telephone Encounter (Signed)
Will send this to patient's cardiologist and his nurse.

## 2021-02-10 NOTE — Telephone Encounter (Signed)
Patient called on 02/03/21 and cancelled GXT and states he will call back later to reschedule per Satira Sark. Order will be removed from the [place] and when pt calls back to reschedule we will reinstate the order. Thank you.

## 2021-02-16 ENCOUNTER — Encounter (HOSPITAL_COMMUNITY): Payer: Medicare Other

## 2021-02-28 DIAGNOSIS — D225 Melanocytic nevi of trunk: Secondary | ICD-10-CM | POA: Diagnosis not present

## 2021-02-28 DIAGNOSIS — D2272 Melanocytic nevi of left lower limb, including hip: Secondary | ICD-10-CM | POA: Diagnosis not present

## 2021-02-28 DIAGNOSIS — L821 Other seborrheic keratosis: Secondary | ICD-10-CM | POA: Diagnosis not present

## 2021-02-28 DIAGNOSIS — Z85820 Personal history of malignant melanoma of skin: Secondary | ICD-10-CM | POA: Diagnosis not present

## 2021-02-28 DIAGNOSIS — D2271 Melanocytic nevi of right lower limb, including hip: Secondary | ICD-10-CM | POA: Diagnosis not present

## 2021-02-28 DIAGNOSIS — Z85828 Personal history of other malignant neoplasm of skin: Secondary | ICD-10-CM | POA: Diagnosis not present

## 2021-02-28 DIAGNOSIS — L111 Transient acantholytic dermatosis [Grover]: Secondary | ICD-10-CM | POA: Diagnosis not present

## 2021-02-28 DIAGNOSIS — L570 Actinic keratosis: Secondary | ICD-10-CM | POA: Diagnosis not present

## 2021-06-13 ENCOUNTER — Ambulatory Visit (INDEPENDENT_AMBULATORY_CARE_PROVIDER_SITE_OTHER): Payer: Medicare Other | Admitting: Internal Medicine

## 2021-06-13 ENCOUNTER — Encounter: Payer: Self-pay | Admitting: Internal Medicine

## 2021-06-13 ENCOUNTER — Other Ambulatory Visit: Payer: Self-pay

## 2021-06-13 VITALS — BP 106/60 | HR 83 | Temp 97.7°F | Ht 71.0 in | Wt 197.0 lb

## 2021-06-13 DIAGNOSIS — N400 Enlarged prostate without lower urinary tract symptoms: Secondary | ICD-10-CM

## 2021-06-13 DIAGNOSIS — R1319 Other dysphagia: Secondary | ICD-10-CM | POA: Diagnosis not present

## 2021-06-13 DIAGNOSIS — I2510 Atherosclerotic heart disease of native coronary artery without angina pectoris: Secondary | ICD-10-CM

## 2021-06-13 DIAGNOSIS — I10 Essential (primary) hypertension: Secondary | ICD-10-CM

## 2021-06-13 DIAGNOSIS — Z0000 Encounter for general adult medical examination without abnormal findings: Secondary | ICD-10-CM

## 2021-06-13 NOTE — Assessment & Plan Note (Signed)
Only if he eats fast Recommended GI evaluation if worsens

## 2021-06-13 NOTE — Assessment & Plan Note (Addendum)
I have personally reviewed the Medicare Annual Wellness questionnaire and have noted 1. The patient's medical and social history 2. Their use of alcohol, tobacco or illicit drugs 3. Their current medications and supplements 4. The patient's functional ability including ADL's, fall risks, home safety risks and hearing or visual             impairment. 5. Diet and physical activities 6. Evidence for depression or mood disorders  The patients weight, height, BMI and visual acuity have been recorded in the chart I have made referrals, counseling and provided education to the patient based review of the above and I have provided the pt with a written personalized care plan for preventive services.  I have provided you with a copy of your personalized plan for preventive services. Please take the time to review along with your updated medication list.  Needs COVID booster Prefers no flu vaccine Td if any injury Discussed resistance exercise Consider shingirx at pharmacy Colon due 2024 No PSA due to age

## 2021-06-13 NOTE — Assessment & Plan Note (Signed)
No symptoms On statin, ramipril and ASA Dr  Percival Spanish had suggested stress test--unclear if that is needed with no symptoms

## 2021-06-13 NOTE — Assessment & Plan Note (Signed)
BP Readings from Last 3 Encounters:  06/13/21 106/60  02/03/21 128/74  06/16/20 114/62   Controlled on the ramipril

## 2021-06-13 NOTE — Assessment & Plan Note (Signed)
Symptoms actually better Would consider tamsulosin if worsens

## 2021-06-13 NOTE — Progress Notes (Signed)
Hearing Screening   500Hz  1000Hz  2000Hz  4000Hz   Right ear 0 20 20 0  Left ear 20 20 20 20   Vision Screening - Comments:: January 2022

## 2021-06-13 NOTE — Progress Notes (Signed)
Subjective:    Patient ID: Juan [name], male    DOB: 1951-08-07, 70 y.o.   MRN: [id number]  HPI Here for Medicare wellness visit and follow up of chronic health conditions This visit occurred during the SARS-CoV-2 public health emergency.  Safety protocols were in place, including screening questions prior to the visit, additional usage of staff PPE, and extensive cleaning of exam room while observing appropriate contact time as indicated for disinfecting solutions.   Reviewed form and advanced directives Reviewed other doctors No tobacco Still enjoys occ beer or bourbon Walking regularly Vision is okay Hearing is "fine"---no functional problems No falls No depression or anhedonia Independent with instrumental ADLs No memory problems  Having trouble with right shoulder Limited abduction and pain along bicipital tendon Limits his throwing and he needs to left hand more Has seen ortho in the past--both shoulders have been a problem (frozen shoulder on left which improved with PT, etc) No problems with golf  Some right groin discomfort at times No specific activity No bulge or mass  No chest pain or SOB Did have cardiology visit in March---nothing worrisome (blood work okay) No dizziness or syncope No edema No palpitations  Continues on the statin  Voids okay Stream hesitant at times --but nothing significant  Current Outpatient Medications on File Prior to Visit  Medication Sig Dispense Refill   aspirin EC 81 MG tablet Take 81 mg by mouth daily.     atorvastatin (LIPITOR) 20 MG tablet Take 1 tablet (20 mg total) by mouth daily. 90 tablet 2   ramipril (ALTACE) 2.5 MG capsule TAKE 1 CAPSULE BY MOUTH EVERY DAY 90 capsule 3   No current facility-administered medications on file prior to visit.    No Known Allergies  Past Medical History:  Diagnosis Date   CAD (coronary artery disease)    a. RCA occlusion with 2 drug-eluting stents 2004;  b. 90% stenosis distal to  the stents in the same artery treated with drug-eluting stenting February 2012;  .c 12/2013 Cath: nonobs dzs.   Complication of anesthesia    Diverticulosis of colon    GERD (gastroesophageal reflux disease)    Hyperlipidemia    Hypertension    Melanoma ([place])    Myocardial infarction ([place]) 2004   PONV (postoperative nausea and vomiting)    Age 20- not since    Past Surgical History:  Procedure Laterality Date   COLONOSCOPY     ESOPHAGOSCOPY N/A 05/31/2015   Procedure: ESOPHAGOSCOPY;  Surgeon: Izora Gala, MD;  Location: [place];  Service: ENT;  Laterality: N/A;   LEFT HEART CATHETERIZATION WITH CORONARY ANGIOGRAM N/A 12/14/2013   Procedure: LEFT HEART CATHETERIZATION WITH CORONARY ANGIOGRAM;  Surgeon: Sinclair Grooms, MD;  Location: [facility] CATH LAB;  Service: Cardiovascular;  Laterality: N/A;   MELANOMA EXCISION     3 seperate ones   PTCA  2004   wisdom teeth removal      Family History  Problem Relation Age of Onset   Heart attack Father 89       died   Stroke Mother    Hypertension Mother    Hypertension Brother    Cancer Brother        prostate   Colon cancer Neg Hx    Pancreatic cancer Neg Hx    Stomach cancer Neg Hx    Esophageal cancer Neg Hx    Rectal cancer Neg Hx    Diabetes Neg Hx     Social History  Socioeconomic History   Marital status: Married    Spouse name: Not on file   Number of children: 1   Years of education: Not on file   Highest education level: Not on file  Occupational History   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  Tobacco Use   Smoking status: Never   Smokeless tobacco: Never  Vaping Use   Vaping Use: Never used  Substance and Sexual Activity   Alcohol use: Yes    Alcohol/week: 1.0 standard drink    Types: 1 Cans of beer per week   Drug use: No   Sexual activity: Not on file  Other Topics Concern   Not on file  Social History Narrative   No living will or health care POA   Would want wife  to make decisions--then daughter   Would accept resuscitation attempts   Would accept at least short term trial of tube feeds   Social Determinants of Health   Financial Resource Strain: Not on file  Food Insecurity: Not on file  Transportation Needs: Not on file  Physical Activity: Not on file  Stress: Not on file  Social Connections: Not on file  Intimate Partner Violence: Not on file   Review of Systems Appetite is "too good" Has gained back some weight Sleeps fine Wears seat belt Teeth okay--recent crown No heartburn. Does have some dysphagia if eats too fast. (Not enough for action) Bowels move fine--no blood (rare blood on toilet paper from hemorrhoid) No other back or joint issues No suspicious skin lesions    Objective:   Physical Exam Constitutional:      Appearance: Normal appearance.  HENT:     Mouth/Throat:     Comments: No lesions Eyes:     Conjunctiva/sclera: Conjunctivae normal.     Pupils: Pupils are equal, round, and reactive to light.  Cardiovascular:     Rate and Rhythm: Normal rate and regular rhythm.     Pulses: Normal pulses.     Heart sounds: No murmur heard.   No gallop.  Pulmonary:     Effort: Pulmonary effort is normal.     Breath sounds: Normal breath sounds. No wheezing or rales.  Abdominal:     Palpations: Abdomen is soft.     Tenderness: There is no abdominal tenderness.     Comments: No inguinal hernia  Musculoskeletal:     Cervical back: Neck supple.     Right lower leg: No edema.     Left lower leg: No edema.     Comments: Mild frozen shoulder on right (discussed therapy himself--consider cortisone injection)  Lymphadenopathy:     Cervical: No cervical adenopathy.  Skin:    General: Skin is warm.     Findings: No rash.  Neurological:     Mental Status: He is alert and oriented to person, place, and time.     Comments: Shon Millet, Obama" [phone] [id number] Recall 3/3  Psychiatric:         Mood and Affect: Mood normal.        Behavior: Behavior normal.           Assessment & Plan:

## 2021-07-20 DIAGNOSIS — U071 COVID-19: Secondary | ICD-10-CM | POA: Diagnosis not present

## 2021-08-28 DIAGNOSIS — L570 Actinic keratosis: Secondary | ICD-10-CM | POA: Diagnosis not present

## 2021-08-28 DIAGNOSIS — L821 Other seborrheic keratosis: Secondary | ICD-10-CM | POA: Diagnosis not present

## 2021-08-28 DIAGNOSIS — Z85820 Personal history of malignant melanoma of skin: Secondary | ICD-10-CM | POA: Diagnosis not present

## 2021-08-28 DIAGNOSIS — L812 Freckles: Secondary | ICD-10-CM | POA: Diagnosis not present

## 2021-08-28 DIAGNOSIS — L111 Transient acantholytic dermatosis [Grover]: Secondary | ICD-10-CM | POA: Diagnosis not present

## 2021-08-28 DIAGNOSIS — Z85828 Personal history of other malignant neoplasm of skin: Secondary | ICD-10-CM | POA: Diagnosis not present

## 2021-09-12 ENCOUNTER — Telehealth: Payer: Self-pay

## 2021-09-12 ENCOUNTER — Emergency Department (HOSPITAL_COMMUNITY): Payer: Medicare Other | Admitting: Anesthesiology

## 2021-09-12 ENCOUNTER — Observation Stay (HOSPITAL_COMMUNITY)
Admission: EM | Admit: 2021-09-12 | Discharge: 2021-09-13 | Disposition: A | Discharge status: Home / Self Care | Payer: Medicare Other | Attending: Surgery | Admitting: Surgery

## 2021-09-12 ENCOUNTER — Ambulatory Visit
Admission: RE | Admit: 2021-09-12 | Discharge: 2021-09-12 | Disposition: A | Discharge status: Home / Self Care | Payer: Medicare Other | Source: Ambulatory Visit | Attending: Nurse Practitioner | Admitting: Nurse Practitioner

## 2021-09-12 ENCOUNTER — Encounter (HOSPITAL_COMMUNITY)
Admission: EM | Disposition: A | Discharge status: Home / Self Care | Payer: Self-pay | Source: Home / Self Care | Attending: Emergency Medicine

## 2021-09-12 ENCOUNTER — Ambulatory Visit (INDEPENDENT_AMBULATORY_CARE_PROVIDER_SITE_OTHER): Payer: Medicare Other | Admitting: Nurse Practitioner

## 2021-09-12 ENCOUNTER — Other Ambulatory Visit: Payer: Self-pay

## 2021-09-12 ENCOUNTER — Encounter (HOSPITAL_COMMUNITY): Payer: Self-pay | Admitting: Emergency Medicine

## 2021-09-12 VITALS — BP 130/72 | HR 77 | Temp 97.2°F | Resp 12 | Ht 71.0 in | Wt 200.0 lb

## 2021-09-12 DIAGNOSIS — Z7982 Long term (current) use of aspirin: Secondary | ICD-10-CM | POA: Diagnosis not present

## 2021-09-12 DIAGNOSIS — I2510 Atherosclerotic heart disease of native coronary artery without angina pectoris: Secondary | ICD-10-CM | POA: Insufficient documentation

## 2021-09-12 DIAGNOSIS — Z20822 Contact with and (suspected) exposure to covid-19: Secondary | ICD-10-CM | POA: Insufficient documentation

## 2021-09-12 DIAGNOSIS — K56609 Unspecified intestinal obstruction, unspecified as to partial versus complete obstruction: Secondary | ICD-10-CM | POA: Diagnosis not present

## 2021-09-12 DIAGNOSIS — Z8719 Personal history of other diseases of the digestive system: Secondary | ICD-10-CM

## 2021-09-12 DIAGNOSIS — R8290 Unspecified abnormal findings in urine: Secondary | ICD-10-CM | POA: Insufficient documentation

## 2021-09-12 DIAGNOSIS — R1031 Right lower quadrant pain: Secondary | ICD-10-CM

## 2021-09-12 DIAGNOSIS — K30 Functional dyspepsia: Secondary | ICD-10-CM | POA: Insufficient documentation

## 2021-09-12 DIAGNOSIS — I10 Essential (primary) hypertension: Secondary | ICD-10-CM | POA: Diagnosis not present

## 2021-09-12 DIAGNOSIS — K4030 Unilateral inguinal hernia, with obstruction, without gangrene, not specified as recurrent: Secondary | ICD-10-CM | POA: Diagnosis not present

## 2021-09-12 DIAGNOSIS — K219 Gastro-esophageal reflux disease without esophagitis: Secondary | ICD-10-CM | POA: Diagnosis not present

## 2021-09-12 DIAGNOSIS — K5730 Diverticulosis of large intestine without perforation or abscess without bleeding: Secondary | ICD-10-CM | POA: Diagnosis not present

## 2021-09-12 DIAGNOSIS — E785 Hyperlipidemia, unspecified: Secondary | ICD-10-CM | POA: Diagnosis not present

## 2021-09-12 DIAGNOSIS — Z79899 Other long term (current) drug therapy: Secondary | ICD-10-CM | POA: Insufficient documentation

## 2021-09-12 DIAGNOSIS — K6389 Other specified diseases of intestine: Secondary | ICD-10-CM | POA: Diagnosis not present

## 2021-09-12 HISTORY — PX: INGUINAL HERNIA REPAIR: SHX194

## 2021-09-12 LAB — COMPREHENSIVE METABOLIC PANEL
ALT: 15 U/L (ref 0–53)
ALT: 18 U/L (ref 0–44)
AST: 20 U/L (ref 0–37)
AST: 22 U/L (ref 15–41)
Albumin: 4.1 g/dL (ref 3.5–5.2)
Albumin: 3.6 g/dL (ref 3.5–5.0)
Alkaline Phosphatase: 82 U/L (ref 39–117)
Alkaline Phosphatase: 82 U/L (ref 38–126)
Anion gap: 9 (ref 5–15)
BUN: 10 mg/dL (ref 6–23)
BUN: 7 mg/dL — ABNORMAL LOW (ref 8–23)
CO2: 28 mEq/L (ref 19–32)
CO2: 27 mmol/L (ref 22–32)
Calcium: 9.5 mg/dL (ref 8.4–10.5)
Calcium: 9.1 mg/dL (ref 8.9–10.3)
Chloride: 102 mEq/L (ref 96–112)
Chloride: 99 mmol/L (ref 98–111)
Creatinine, Ser: 0.91 mg/dL (ref 0.40–1.50)
Creatinine, Ser: 0.94 mg/dL (ref 0.61–1.24)
GFR, Estimated: >60 mL/min (ref >60)
GFR: 84.39 mL/min (ref >60.00)
Glucose, Bld: 116 mg/dL — ABNORMAL HIGH (ref 70–99)
Glucose, Bld: 118 mg/dL — ABNORMAL HIGH (ref 70–99)
Potassium: 4.5 mEq/L (ref 3.5–5.1)
Potassium: 4.3 mmol/L (ref 3.5–5.1)
Sodium: 137 mEq/L (ref 135–145)
Sodium: 135 mmol/L (ref 135–145)
Total Bilirubin: 0.7 mg/dL (ref 0.2–1.2)
Total Bilirubin: 1.2 mg/dL (ref 0.3–1.2)
Total Protein: 7.1 g/dL (ref 6.0–8.3)
Total Protein: 6.9 g/dL (ref 6.5–8.1)

## 2021-09-12 LAB — CBC WITH DIFFERENTIAL/PLATELET
Abs Immature Granulocytes: 0.05 10*3/uL (ref 0.00–0.07)
Basophils Absolute: 0 10*3/uL (ref 0.0–0.1)
Basophils Absolute: 0 10*3/uL (ref 0.0–0.1)
Basophils Relative: 0.3 % (ref 0.0–3.0)
Basophils Relative: 0 %
Eosinophils Absolute: 0 10*3/uL (ref 0.0–0.7)
Eosinophils Absolute: 0 10*3/uL (ref 0.0–0.5)
Eosinophils Relative: 0.2 % (ref 0.0–5.0)
Eosinophils Relative: 0 %
HCT: 46.4 % (ref 39.0–52.0)
HCT: 47.9 % (ref 39.0–52.0)
Hemoglobin: 14.8 g/dL (ref 13.0–17.0)
Hemoglobin: 15.2 g/dL (ref 13.0–17.0)
Immature Granulocytes: 0 %
Lymphocytes Relative: 7.2 % — ABNORMAL LOW (ref 12.0–46.0)
Lymphocytes Relative: 12 %
Lymphs Abs: 0.9 10*3/uL (ref 0.7–4.0)
Lymphs Abs: 1.7 10*3/uL (ref 0.7–4.0)
MCH: 26.7 pg (ref 26.0–34.0)
MCHC: 31.9 g/dL (ref 30.0–36.0)
MCHC: 31.7 g/dL (ref 30.0–36.0)
MCV: 84 fl (ref 78.0–100.0)
MCV: 84.2 fL (ref 80.0–100.0)
Monocytes Absolute: 0.7 10*3/uL (ref 0.1–1.0)
Monocytes Absolute: 1 10*3/uL (ref 0.1–1.0)
Monocytes Relative: 5.7 % (ref 3.0–12.0)
Monocytes Relative: 7 %
Neutro Abs: 10.8 10*3/uL — ABNORMAL HIGH (ref 1.4–7.7)
Neutro Abs: 11.3 10*3/uL — ABNORMAL HIGH (ref 1.7–7.7)
Neutrophils Relative %: 86.6 % — ABNORMAL HIGH (ref 43.0–77.0)
Neutrophils Relative %: 81 %
Platelets: 259 10*3/uL (ref 150.0–400.0)
Platelets: 284 10*3/uL (ref 150–400)
RBC: 5.52 Mil/uL (ref 4.22–5.81)
RBC: 5.69 MIL/uL (ref 4.22–5.81)
RDW: 15.2 % (ref 11.5–15.5)
RDW: 14.8 % (ref 11.5–15.5)
WBC: 12.5 10*3/uL — ABNORMAL HIGH (ref 4.0–10.5)
WBC: 14.2 10*3/uL — ABNORMAL HIGH (ref 4.0–10.5)
nRBC: 0 % (ref 0.0–0.2)

## 2021-09-12 LAB — POCT URINALYSIS DIP (CLINITEK)
Bilirubin, UA: NEGATIVE
Blood, UA: NEGATIVE
Glucose, UA: NEGATIVE mg/dL
Ketones, POC UA: NEGATIVE mg/dL
Nitrite, UA: NEGATIVE
Spec Grav, UA: 1.015 (ref 1.010–1.025)
Urobilinogen, UA: NEGATIVE E.U./dL — AB
pH, UA: 6 (ref 5.0–8.0)

## 2021-09-12 LAB — RESP PANEL BY RT-PCR (FLU A&B, COVID) ARPGX2
Influenza A by PCR: NEGATIVE
Influenza B by PCR: NEGATIVE
SARS Coronavirus 2 by RT PCR: NEGATIVE

## 2021-09-12 LAB — URINALYSIS, MICROSCOPIC ONLY

## 2021-09-12 LAB — LACTIC ACID, PLASMA: Lactic Acid, Venous: 1.6 mmol/L (ref 0.5–1.9)

## 2021-09-12 LAB — LIPASE, BLOOD: Lipase: 29 U/L (ref 11–51)

## 2021-09-12 LAB — LIPASE: Lipase: 17 U/L (ref 11.0–59.0)

## 2021-09-12 SURGERY — REPAIR, HERNIA, INGUINAL, ADULT
Anesthesia: General | Laterality: Right

## 2021-09-12 MED ORDER — SUCCINYLCHOLINE CHLORIDE 200 MG/10ML IV SOSY
PREFILLED_SYRINGE | INTRAVENOUS | Status: AC
  Filled 2021-09-12: qty 10

## 2021-09-12 MED ORDER — BUPIVACAINE-EPINEPHRINE (PF) 0.25% -1:200000 IJ SOLN
INTRAMUSCULAR | Status: AC
  Filled 2021-09-12: qty 30

## 2021-09-12 MED ORDER — ONDANSETRON HCL 4 MG/2ML IJ SOLN: 4.0000 mg | Freq: Once | INTRAMUSCULAR | Status: DC | PRN

## 2021-09-12 MED ORDER — CEFAZOLIN SODIUM-DEXTROSE 2-3 GM-%(50ML) IV SOLR
INTRAVENOUS | Status: DC | PRN
  Administered 2021-09-12: 2 g via INTRAVENOUS

## 2021-09-12 MED ORDER — SUCCINYLCHOLINE CHLORIDE 200 MG/10ML IV SOSY
PREFILLED_SYRINGE | INTRAVENOUS | Status: DC | PRN
  Administered 2021-09-12: 100 mg via INTRAVENOUS

## 2021-09-12 MED ORDER — PHENYLEPHRINE HCL (PRESSORS) 10 MG/ML IV SOLN
INTRAVENOUS | Status: DC | PRN
  Administered 2021-09-12 (×3): 80 ug via INTRAVENOUS

## 2021-09-12 MED ORDER — FENTANYL CITRATE PF 50 MCG/ML IJ SOSY
50.0000 ug | PREFILLED_SYRINGE | Freq: Once | INTRAMUSCULAR | Status: AC
  Administered 2021-09-12: 50 ug via INTRAVENOUS
  Filled 2021-09-12: qty 1

## 2021-09-12 MED ORDER — ACETAMINOPHEN 10 MG/ML IV SOLN
INTRAVENOUS | Status: AC
  Filled 2021-09-12: qty 100

## 2021-09-12 MED ORDER — LACTATED RINGERS IV SOLN: INTRAVENOUS | Status: DC | PRN

## 2021-09-12 MED ORDER — FENTANYL CITRATE (PF) 100 MCG/2ML IJ SOLN
INTRAMUSCULAR | Status: DC | PRN
  Administered 2021-09-12 (×2): 50 ug via INTRAVENOUS
  Administered 2021-09-12: 100 ug via INTRAVENOUS

## 2021-09-12 MED ORDER — DEXAMETHASONE SODIUM PHOSPHATE 10 MG/ML IJ SOLN
INTRAMUSCULAR | Status: DC | PRN
  Administered 2021-09-12: 10 mg via INTRAVENOUS

## 2021-09-12 MED ORDER — ONDANSETRON HCL 4 MG/2ML IJ SOLN
INTRAMUSCULAR | Status: AC
  Filled 2021-09-12: qty 2

## 2021-09-12 MED ORDER — PROPOFOL 10 MG/ML IV BOLUS
INTRAVENOUS | Status: DC | PRN
  Administered 2021-09-12: 150 mg via INTRAVENOUS

## 2021-09-12 MED ORDER — FENTANYL CITRATE (PF) 100 MCG/2ML IJ SOLN
25.0000 ug | INTRAMUSCULAR | Status: DC | PRN
  Administered 2021-09-13: 50 ug via INTRAVENOUS

## 2021-09-12 MED ORDER — IOPAMIDOL (ISOVUE-300) INJECTION 61%
100.0000 mL | Freq: Once | INTRAVENOUS | Status: AC | PRN
  Administered 2021-09-12: 100 mL via INTRAVENOUS

## 2021-09-12 MED ORDER — ONDANSETRON HCL 4 MG/2ML IJ SOLN
INTRAMUSCULAR | Status: DC | PRN
  Administered 2021-09-12: 4 mg via INTRAVENOUS

## 2021-09-12 MED ORDER — ROCURONIUM BROMIDE 100 MG/10ML IV SOLN
INTRAVENOUS | Status: DC | PRN
  Administered 2021-09-12: 70 mg via INTRAVENOUS
  Administered 2021-09-12: 10 mg via INTRAVENOUS

## 2021-09-12 MED ORDER — PROPOFOL 10 MG/ML IV BOLUS
INTRAVENOUS | Status: AC
  Filled 2021-09-12: qty 20

## 2021-09-12 MED ORDER — LIDOCAINE 2% (20 MG/ML) 5 ML SYRINGE
INTRAMUSCULAR | Status: AC
  Filled 2021-09-12: qty 5

## 2021-09-12 MED ORDER — LIDOCAINE HCL (CARDIAC) PF 100 MG/5ML IV SOSY
PREFILLED_SYRINGE | INTRAVENOUS | Status: DC | PRN
  Administered 2021-09-12: 80 mg via INTRAVENOUS

## 2021-09-12 MED ORDER — FENTANYL CITRATE (PF) 250 MCG/5ML IJ SOLN
INTRAMUSCULAR | Status: AC
  Filled 2021-09-12: qty 5

## 2021-09-12 SURGICAL SUPPLY — 47 items
BAG COUNTER SPONGE SURGICOUNT (BAG) ×2 IMPLANT
BAG SURGICOUNT SPONGE COUNTING (BAG) ×1
BENZOIN TINCTURE PRP APPL 2/3 (GAUZE/BANDAGES/DRESSINGS) IMPLANT
BLADE CLIPPER SURG (BLADE) ×3 IMPLANT
CHLORAPREP W/TINT 26 (MISCELLANEOUS) ×3 IMPLANT
COVER SURGICAL LIGHT HANDLE (MISCELLANEOUS) ×3 IMPLANT
DERMABOND ADVANCED (GAUZE/BANDAGES/DRESSINGS) ×2
DERMABOND ADVANCED .7 DNX12 (GAUZE/BANDAGES/DRESSINGS) ×1 IMPLANT
DRAIN PENROSE 1/2X12 LTX STRL (WOUND CARE) ×3 IMPLANT
DRAPE LAPAROSCOPIC ABDOMINAL (DRAPES) IMPLANT
DRAPE LAPAROTOMY TRNSV 102X78 (DRAPES) IMPLANT
DRSG TEGADERM 4X4.75 (GAUZE/BANDAGES/DRESSINGS) IMPLANT
ELECT CAUTERY BLADE 6.4 (BLADE) ×3 IMPLANT
ELECT REM PT RETURN 9FT ADLT (ELECTROSURGICAL) ×3
ELECTRODE REM PT RTRN 9FT ADLT (ELECTROSURGICAL) ×1 IMPLANT
GAUZE 4X4 16PLY ~~LOC~~+RFID DBL (SPONGE) ×3 IMPLANT
GAUZE SPONGE 4X4 12PLY STRL (GAUZE/BANDAGES/DRESSINGS) IMPLANT
GLOVE SURG ENC MOIS LTX SZ6.5 (GLOVE) ×3 IMPLANT
GLOVE SURG UNDER POLY LF SZ6 (GLOVE) ×3 IMPLANT
GOWN STRL REUS W/ TWL LRG LVL3 (GOWN DISPOSABLE) ×3 IMPLANT
GOWN STRL REUS W/TWL LRG LVL3 (GOWN DISPOSABLE) ×9
KIT BASIN OR (CUSTOM PROCEDURE TRAY) ×3 IMPLANT
KIT TURNOVER KIT B (KITS) ×3 IMPLANT
MESH ULTRAPRO 3X6 7.6X15CM (Mesh General) ×3 IMPLANT
NEEDLE HYPO 25GX1X1/2 BEV (NEEDLE) ×3 IMPLANT
NS IRRIG 1000ML POUR BTL (IV SOLUTION) ×3 IMPLANT
PACK GENERAL/GYN (CUSTOM PROCEDURE TRAY) ×3 IMPLANT
PAD ARMBOARD 7.5X6 YLW CONV (MISCELLANEOUS) ×3 IMPLANT
PENCIL SMOKE EVACUATOR (MISCELLANEOUS) ×3 IMPLANT
SPONGE INTESTINAL PEANUT (DISPOSABLE) IMPLANT
SUT MNCRL AB 4-0 PS2 18 (SUTURE) ×3 IMPLANT
SUT NOVA NAB GS-21 0 18 T12 DT (SUTURE) IMPLANT
SUT PDS AB 0 CT 36 (SUTURE) IMPLANT
SUT SILK 0 SH 30 (SUTURE) IMPLANT
SUT SILK 2 0 SH (SUTURE) IMPLANT
SUT SILK 3 0 (SUTURE)
SUT SILK 3-0 18XBRD TIE 12 (SUTURE) IMPLANT
SUT VIC AB 0 CT1 27 (SUTURE) ×9
SUT VIC AB 0 CT1 27XBRD ANBCTR (SUTURE) ×3 IMPLANT
SUT VIC AB 0 CT2 27 (SUTURE) ×3 IMPLANT
SUT VIC AB 2-0 SH 27 (SUTURE) ×3
SUT VIC AB 2-0 SH 27X BRD (SUTURE) ×1 IMPLANT
SUT VIC AB 3-0 SH 27 (SUTURE) ×3
SUT VIC AB 3-0 SH 27XBRD (SUTURE) ×1 IMPLANT
SYR CONTROL 10ML LL (SYRINGE) ×3 IMPLANT
TOWEL GREEN STERILE (TOWEL DISPOSABLE) ×3 IMPLANT
TOWEL GREEN STERILE FF (TOWEL DISPOSABLE) ×3 IMPLANT

## 2021-09-12 NOTE — Telephone Encounter (Signed)
Pt has already been arrived for appt with Romilda Garret NP 09/12/21 at 9 AM. Sending note to Romilda Garret NP and Anastasiya CMA.

## 2021-09-12 NOTE — ED Notes (Signed)
Pt ambulatory to restroom

## 2021-09-12 NOTE — ED Provider Notes (Signed)
[facility] EMERGENCY DEPARTMENT Provider Note   CSN: [id number] Arrival date & time: 09/12/21  1801     History Chief Complaint  Patient presents with   sent by dr    Juan [name] is a 70 y.o. male.  Juan [name] is a 70 y.o. male with hx of CAD, HTN, GERD, who presents with past medical history significant for HTN and CAD who presents today for evaluation of RLQ abdominal pain. He states he had a sudden onset of pain last night that he describes as an intense dull ache. It radiated to his groin. Associated with indigestion and increased belching. No N/V. He had three bowel movements today that he states were normal consistency. Has passed some flatus. He took tylenol with mild pain relief. Denies shortness of breath, chest pain, or dizziness. He was seen by his PCP this morning who ordered a CT scan and referred him to the ED for further management after CT showed and incarcerated right inguinal hernia. Last PO intake 1900 yesterday, water at 1700 today. Takes 81 mg aspirin but no other blood thinners. No prior abdominal surgeries.  The history is provided by the patient, medical records and the spouse.      Past Medical History:  Diagnosis Date   CAD (coronary artery disease)    a. RCA occlusion with 2 drug-eluting stents 2004;  b. 90% stenosis distal to the stents in the same artery treated with drug-eluting stenting February 2012;  .c 12/2013 Cath: nonobs dzs.   Complication of anesthesia    Diverticulosis of colon    GERD (gastroesophageal reflux disease)    Hyperlipidemia    Hypertension    Melanoma ([place])    Myocardial infarction ([place]) 2004   PONV (postoperative nausea and vomiting)    Age 85- not since    Patient Active Problem List   Diagnosis Date Noted   Indigestion 09/12/2021   Right lower quadrant abdominal pain 09/12/2021   Abnormal urine finding 09/12/2021   Esophageal dysphagia 06/13/2021   Advance directive discussed with patient  12/28/2015   Hyperlipidemia    Preventative health care 06/05/2011   Diverticulosis of colon 06/05/2011   BPH (benign prostatic hyperplasia) 06/05/2011   DJD (degenerative joint disease) 06/05/2011   Essential hypertension, benign 05/07/2009   Coronary atherosclerosis of native coronary artery 05/07/2009   MELANOMA, HX OF 05/07/2009    Past Surgical History:  Procedure Laterality Date   COLONOSCOPY     ESOPHAGOSCOPY N/A 05/31/2015   Procedure: ESOPHAGOSCOPY;  Surgeon: Izora Gala, MD;  Location: [place];  Service: ENT;  Laterality: N/A;   LEFT HEART CATHETERIZATION WITH CORONARY ANGIOGRAM N/A 12/14/2013   Procedure: LEFT HEART CATHETERIZATION WITH CORONARY ANGIOGRAM;  Surgeon: Sinclair Grooms, MD;  Location: [facility] CATH LAB;  Service: Cardiovascular;  Laterality: N/A;   MELANOMA EXCISION     3 seperate ones   PTCA  2004   wisdom teeth removal         Family History  Problem Relation Age of Onset   Heart attack Father 16       died   Stroke Mother    Hypertension Mother    Hypertension Brother    Cancer Brother        prostate   Colon cancer Neg Hx    Pancreatic cancer Neg Hx    Stomach cancer Neg Hx    Esophageal cancer Neg Hx    Rectal cancer Neg Hx    Diabetes Neg  Hx     Social History   Tobacco Use   Smoking status: Never   Smokeless tobacco: Never  Vaping Use   Vaping Use: Never used  Substance Use Topics   Alcohol use: Yes    Alcohol/week: 1.0 standard drink    Types: 1 Cans of beer per week   Drug use: No    Home Medications Prior to Admission medications   Medication Sig Start Date End Date Taking? Authorizing Provider  acetaminophen (TYLENOL) 325 MG tablet Take 650 mg by mouth every 6 (six) hours as needed for headache, mild pain or fever.   Yes [provider]  aspirin EC 81 MG tablet Take 81 mg by mouth daily.   Yes [provider]  atorvastatin (LIPITOR) 20 MG tablet Take 1 tablet (20 mg total) by mouth daily. 02/01/21  Yes Minus Breeding, MD  Multiple Vitamins-Minerals (EMERGEN-C IMMUNE PLUS) PACK Take 1 packet by mouth in the morning.   Yes [provider]  ramipril (ALTACE) 2.5 MG capsule TAKE 1 CAPSULE BY MOUTH EVERY DAY Patient taking differently: Take 2.5 mg by mouth daily. 11/14/20  Yes Minus Breeding, MD    Allergies    Patient has no known allergies.  Review of Systems   Review of Systems  Constitutional:  Negative for chills and fever.  HENT: Negative.    Respiratory:  Negative for cough and shortness of breath.   Cardiovascular:  Negative for chest pain.  Gastrointestinal:  Positive for abdominal pain. Negative for constipation, diarrhea, nausea and vomiting.  Genitourinary:  Negative for dysuria and frequency.  Musculoskeletal:  Negative for arthralgias and myalgias.  Skin:  Negative for color change and rash.  All other systems reviewed and are negative.  Physical Exam Updated Vital Signs BP 132/79 (BP Location: Left Arm)   Pulse 88   Temp 99.2 F (37.3 C) (Oral)   Resp 16   SpO2 97%   Physical Exam Vitals and nursing note reviewed.  Constitutional:      General: He is not in acute distress.    Appearance: Normal appearance. He is well-developed. He is not ill-appearing or diaphoretic.  HENT:     Head: Normocephalic and atraumatic.  Eyes:     General:        Right eye: No discharge.        Left eye: No discharge.     Pupils: Pupils are equal, round, and reactive to light.  Cardiovascular:     Rate and Rhythm: Normal rate and regular rhythm.     Pulses: Normal pulses.     Heart sounds: Normal heart sounds.  Pulmonary:     Effort: Pulmonary effort is normal. No respiratory distress.     Breath sounds: Normal breath sounds. No wheezing or rales.     Comments: Respirations equal and unlabored, patient able to speak in full sentences, lungs clear to auscultation bilaterally  Abdominal:     General: Bowel sounds are normal. There is no distension.     Palpations: Abdomen is  soft. There is no mass.     Tenderness: There is abdominal tenderness. There is no guarding.     Comments: Abdomen soft, nondistended, some borborygmi noted on auscultation, focal tenderness in the right lower quadrant and right pelvis without visible hernia or palpable mass.  Musculoskeletal:        General: No deformity.     Cervical back: Neck supple.  Skin:    General: Skin is warm and dry.  Capillary Refill: Capillary refill takes less than 2 seconds.  Neurological:     Mental Status: He is alert and oriented to person, place, and time.     Coordination: Coordination normal.     Comments: Speech is clear, able to follow commands Moves extremities without ataxia, coordination intact  Psychiatric:        Mood and Affect: Mood normal.        Behavior: Behavior normal.    ED Results / Procedures / Treatments   Labs (all labs ordered are listed, but only abnormal results are displayed) Labs Reviewed  CBC WITH DIFFERENTIAL/PLATELET - Abnormal; Notable for the following components:      Result Value   WBC 14.2 (*)    Neutro Abs 11.3 (*)    All other components within normal limits  COMPREHENSIVE METABOLIC PANEL - Abnormal; Notable for the following components:   Glucose, Bld 118 (*)    BUN 7 (*)    All other components within normal limits  RESP PANEL BY RT-PCR (FLU A&B, COVID) ARPGX2  LIPASE, BLOOD  LACTIC ACID, PLASMA    EKG None  Radiology CT Abdomen Pelvis W Contrast  Result Date: 09/12/2021 CLINICAL DATA:  Right lower quadrant pain, concern for appendicitis or hernia EXAM: CT ABDOMEN AND PELVIS WITH CONTRAST TECHNIQUE: Multidetector CT imaging of the abdomen and pelvis was performed using the standard protocol following bolus administration of intravenous contrast. CONTRAST:  176mL ISOVUE-300 IOPAMIDOL (ISOVUE-300) INJECTION 61% COMPARISON:  None. FINDINGS: Lower chest: There is minimal dependent subsegmental atelectasis in the lung bases. The imaged heart is  unremarkable. Hepatobiliary: The liver and gallbladder are unremarkable. There is no biliary ductal dilatation. Pancreas: Unremarkable. Spleen: Unremarkable. Adrenals/Urinary Tract: The adrenals are unremarkable. The kidneys are unremarkable, with no focal lesion, stone, hydronephrosis, or hydroureter. The bladder is unremarkable. Stomach/Bowel: The stomach is significantly distended with enteric contrast. There is refluxed enteric contrast into the esophagus. The terminal ileum is herniated into the right inguinal canal with dilation of the upstream small bowel with the largest loop measuring up to 3.3 cm in diameter. There is fecalized material within this dilated small bowel. There is no pneumatosis intestinalis. There is extensive colonic diverticulosis without evidence of acute diverticulitis. Vascular/Lymphatic: There is scattered calcified atherosclerotic plaque throughout the nonaneurysmal abdominal aorta. The major branch vessels are patent. The main portal and splenic veins are patent. There is no portal venous gas. There is no abdominal or pelvic lymphadenopathy. Reproductive: The prostate and seminal vesicles are unremarkable. Other: There is trace free fluid in the right lower quadrant, likely reactive (2-77). There is no free intraperitoneal air. Musculoskeletal: There is multilevel degenerative change throughout the imaged spine with fusion of the vertebral bodies anteriorly. There is also partial fusion of the SI joints. IMPRESSION: 1. The distal ileum is herniated into the inguinal canal with evidence of incarceration and small-bowel obstruction as above. No pneumatosis intestinalis or portal venous gas. 2. Diverticulosis without evidence of acute diverticulitis. 3. 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 These results were called by telephone at the time of interpretation on 09/12/2021 at 5:01 pm to provider Dr Theda Sers , who verbally acknowledged these results. Electronically  Signed   By: Valetta Mole M.D.   On: 09/12/2021 17:01    Procedures Procedures   Medications Ordered in ED Medications  fentaNYL (SUBLIMAZE) injection 50 mcg (50 mcg Intravenous Given 09/12/21 2029)    ED Course  I have reviewed the triage vital signs and the nursing notes.  Pertinent labs & imaging results that were available during my care of the patient were reviewed by me and considered in my medical decision making (see chart for details).    MDM Rules/Calculators/A&P                           Patient presents to the ED with complaints of abdominal pain. Patient nontoxic appearing, in no apparent distress, vitals WNL. On exam patient tender to palpation in RLQ and right groin, without visible hernia or palpable mass, no peritoneal signs. Pt had outpatient CT done today which shows an incarcerated right inguinal hernia. Analgesics, anti-emetics, and fluids administered.   Additional history obtained:  Additional history obtained from chart review & nursing note review. History obtained from spouse and daughter as well.  Lab Tests:  I Ordered, reviewed, and interpreted labs, which included:  CBC: Leukocytosis of 14.2, normal hemoglobin CMP: No significant electrolyte derangements, normal renal and liver function Lipase: WNL Lactic acid: WNL   Imaging :  I have reviewed imaging studies that were done as an outpatient today, consistent with incarcerated right inguinal hernia, no pneumatosis intestinalis or portal venous gas  ED Course:   Case discussed with Dr. Bobbye Morton with general surgery who will see and evaluate patient, initial attempt to reduce incarcerated hernia was deferred as patient was in the hallway.  Dr. Bobbye Morton attempted reduction at bedside and was unsuccessful.  She will plan to admit patient and take to the OR for hernia repair.  Portions of this note were generated with Lobbyist. Dictation errors may occur despite best attempts at  proofreading.    Final Clinical Impression(s) / ED Diagnoses Final diagnoses:  Incarcerated inguinal hernia    Rx / DC Orders ED Discharge Orders     None        Janet Berlin 09/12/21 2330    Godfrey Pick, MD 09/13/21 0157

## 2021-09-12 NOTE — ED Provider Notes (Signed)
Emergency Medicine Provider Triage Evaluation Note  Juan [name] , a 69 y.o. male  was evaluated in triage.  Pt complains of abd pain that started last night. Had outpt ct and was dx with incarcerated hernia.  Review of Systems  Positive: Abd pain Negative: nv  Physical Exam  BP 132/79 (BP Location: Left Arm)   Pulse 88   Temp 99.2 F (37.3 C) (Oral)   Resp 16   SpO2 97%  Gen:   Awake, no distress   Resp:  Normal effort  MSK:   Moves extremities without difficulty  Other:  TTP to the RLQ  Medical Decision Making  Medically screening exam initiated at 6:59 PM.  Appropriate orders placed.  Juan [name] was informed that the remainder of the evaluation will be completed by another provider, this initial triage assessment does not replace that evaluation, and the importance of remaining in the ED until their evaluation is complete.     Bishop Dublin 09/12/21 1900    Margette Fast, MD 09/12/21 2207

## 2021-09-12 NOTE — ED Notes (Signed)
MD Lovick at bedside to attempt and reduce hernia - pain meds given

## 2021-09-12 NOTE — Assessment & Plan Note (Signed)
Patient was experiencing some indigestion this morning.  States last time he did when he was having his heart attack.  Denies any chest pain or shortness of breath.  Patient is EKG looked okay in office.  Patient states he did have just some water and vitamin C supplement this morning.  We will continue to monitor signs and symptoms reviewed as to when to seek emergent help

## 2021-09-12 NOTE — Assessment & Plan Note (Addendum)
We will send off for urine microscopy and culture.

## 2021-09-12 NOTE — ED Notes (Signed)
MD at bedside. 

## 2021-09-12 NOTE — Assessment & Plan Note (Signed)
Patient has concerning signs and symptoms of right lower quadrant pain that was dull aching and consistent in nature.  Concern for appendicitis versus possible hernia complications.  Upon exam patient seems to have a right inguinal and scrotal hernia that patient was unaware of.  We will draw labs pending results order CT scan of abdomen pelvis with contrast.  Gust this when to seek emergent health care if symptoms change or forgetting results.

## 2021-09-12 NOTE — Telephone Encounter (Signed)
Suanne Marker with [facility] said Dr Valetta Mole wants to speak with Romilda Garret NP. Catalina Antigua is not in office. Dr Einar Pheasant will speak with Dr Quintella Baton.

## 2021-09-12 NOTE — Telephone Encounter (Signed)
Spoke with Dr. Quintella Baton.   CT with Right inguinal hernia with obstruction.   Called pt and advised to go to the ER for further evaluation and treatment of obstructed hernia  Called [place] to notify he is coming by private vehicle and call report provided

## 2021-09-12 NOTE — Anesthesia Preprocedure Evaluation (Addendum)
Anesthesia Evaluation  Patient identified by MRN, date of birth, ID band Patient awake    Reviewed: Allergy & Precautions, NPO status , Patient's Chart, lab work & pertinent test results  History of Anesthesia Complications (+) PONV and history of anesthetic complications  Airway Mallampati: III  TM Distance: <3 FB Neck ROM: Full    Dental  (+) Teeth Intact, Dental Advisory Given   Pulmonary neg pulmonary ROS,    Pulmonary exam normal breath sounds clear to auscultation       Cardiovascular hypertension, Pt. on medications + CAD, + Past MI and + Cardiac Stents  Normal cardiovascular exam Rhythm:Regular Rate:Normal     Neuro/Psych negative neurological ROS  negative psych ROS   GI/Hepatic Neg liver ROS, GERD  ,right inguinal hernia   Endo/Other  negative endocrine ROS  Renal/GU negative Renal ROS     Musculoskeletal  (+) Arthritis ,   Abdominal   Peds  Hematology negative hematology ROS (+)   Anesthesia Other Findings Day of surgery medications reviewed with the patient.  Reproductive/Obstetrics                            Anesthesia Physical Anesthesia Plan  ASA: 3 and emergent  Anesthesia Plan: General   Post-op Pain Management:    Induction: Intravenous  PONV Risk Score and Plan: 4 or greater and Dexamethasone, Ondansetron and Treatment may vary due to age or medical condition  Airway Management Planned: Oral ETT  Additional Equipment:   Intra-op Plan:   Post-operative Plan: Extubation in OR  Informed Consent: I have reviewed the patients History and Physical, chart, labs and discussed the procedure including the risks, benefits and alternatives for the proposed anesthesia with the patient or authorized representative who has indicated his/her understanding and acceptance.     Dental advisory given  Plan Discussed with: CRNA  Anesthesia Plan Comments:          Anesthesia Quick Evaluation

## 2021-09-12 NOTE — ED Triage Notes (Signed)
Pt coming from dr, complaint of hernia with obstruction, had ct scan today and told to come to ER.

## 2021-09-12 NOTE — H&P (Signed)
Reason for Consult/Chief Complaint: incarcerated inguinal hernia Consultant: Tivis Ringer, PA  Juan [name] is an 70 y.o. male.   HPI: 63M with R groin "discomfort" since 10/10 PM that kept him from sleeping. Denies n/v, abd pain, fevers. BM x3 today.  Hernia present for unknown period of time, but saw PCP 06/2021 and was noted at that visit. Presented to PCP today with symptoms and sent for CT scan showing incarcerated inguinal hernia. Last colonoscopy ~5-6y ago.   Past Medical History:  Diagnosis Date   CAD (coronary artery disease)    a. RCA occlusion with 2 drug-eluting stents 2004;  b. 90% stenosis distal to the stents in the same artery treated with drug-eluting stenting February 2012;  .c 12/2013 Cath: nonobs dzs.   Complication of anesthesia    Diverticulosis of colon    GERD (gastroesophageal reflux disease)    Hyperlipidemia    Hypertension    Melanoma ([facility])    Myocardial infarction ([facility]) 2004   PONV (postoperative nausea and vomiting)    Age 79- not since    Past Surgical History:  Procedure Laterality Date   COLONOSCOPY     ESOPHAGOSCOPY N/A 05/31/2015   Procedure: ESOPHAGOSCOPY;  Surgeon: Izora Gala, MD;  Location: [facility];  Service: ENT;  Laterality: N/A;   LEFT HEART CATHETERIZATION WITH CORONARY ANGIOGRAM N/A 12/14/2013   Procedure: LEFT HEART CATHETERIZATION WITH CORONARY ANGIOGRAM;  Surgeon: Sinclair Grooms, MD;  Location: [facility] CATH LAB;  Service: Cardiovascular;  Laterality: N/A;   MELANOMA EXCISION     3 seperate ones   PTCA  2004   wisdom teeth removal      Family History  Problem Relation Age of Onset   Heart attack Father 3       died   Stroke Mother    Hypertension Mother    Hypertension Brother    Cancer Brother        prostate   Colon cancer Neg Hx    Pancreatic cancer Neg Hx    Stomach cancer Neg Hx    Esophageal cancer Neg Hx    Rectal cancer Neg Hx    Diabetes Neg Hx     Social History:  reports that he has never smoked. He has never used  smokeless tobacco. He reports current alcohol use of about 1.0 standard drink per week. He reports that he does not use drugs.  Allergies: No Known Allergies  Medications: I have reviewed the patient's current medications.  Results for orders placed or performed during the hospital encounter of 09/12/21 (from the past 48 hour(s))  CBC with Differential     Status: Abnormal   Collection Time: 09/12/21  7:04 PM  Result Value Ref Range   WBC 14.2 (H) 4.0 - 10.5 K/uL   RBC 5.69 4.22 - 5.81 MIL/uL   Hemoglobin 15.2 13.0 - 17.0 g/dL   HCT 47.9 39.0 - 52.0 %   MCV 84.2 80.0 - 100.0 fL   MCH 26.7 26.0 - 34.0 pg   MCHC 31.7 30.0 - 36.0 g/dL   RDW 14.8 11.5 - 15.5 %   Platelets 284 150 - 400 K/uL   nRBC 0.0 0.0 - 0.2 %   Neutrophils Relative % 81 %   Neutro Abs 11.3 (H) 1.7 - 7.7 K/uL   Lymphocytes Relative 12 %   Lymphs Abs 1.7 0.7 - 4.0 K/uL   Monocytes Relative 7 %   Monocytes Absolute 1.0 0.1 - 1.0 K/uL   Eosinophils Relative  0 %   Eosinophils Absolute 0.0 0.0 - 0.5 K/uL   Basophils Relative 0 %   Basophils Absolute 0.0 0.0 - 0.1 K/uL   Immature Granulocytes 0 %   Abs Immature Granulocytes 0.05 0.00 - 0.07 K/uL    Comment: Performed at [facility] [address], [place], [place] [address]  Comprehensive metabolic panel     Status: Abnormal   Collection Time: 09/12/21  7:04 PM  Result Value Ref Range   Sodium 135 135 - 145 mmol/L   Potassium 4.3 3.5 - 5.1 mmol/L   Chloride 99 98 - 111 mmol/L   CO2 27 22 - 32 mmol/L   Glucose, Bld 118 (H) 70 - 99 mg/dL    Comment: Glucose reference range applies only to samples taken after fasting for at least 8 hours.   BUN 7 (L) 8 - 23 mg/dL   Creatinine, Ser 0.94 0.61 - 1.24 mg/dL   Calcium 9.1 8.9 - 10.3 mg/dL   Total Protein 6.9 6.5 - 8.1 g/dL   Albumin 3.6 3.5 - 5.0 g/dL   AST 22 15 - 41 U/L   ALT 18 0 - 44 U/L   Alkaline Phosphatase 82 38 - 126 U/L   Total Bilirubin 1.2 0.3 - 1.2 mg/dL   GFR, Estimated >60 >60 mL/min     Comment: (NOTE) Calculated using the CKD-EPI Creatinine Equation (2021)    Anion gap 9 5 - 15    Comment: Performed at [facility] [address], [place], [place] [address]  Lipase, blood     Status: None   Collection Time: 09/12/21  7:04 PM  Result Value Ref Range   Lipase 29 11 - 51 U/L    Comment: Performed at [facility] [address] [place], [place] [address]    CT Abdomen Pelvis W Contrast  Result Date: 09/12/2021 CLINICAL DATA:  Right lower quadrant pain, concern for appendicitis or hernia EXAM: CT ABDOMEN AND PELVIS WITH CONTRAST TECHNIQUE: Multidetector CT imaging of the abdomen and pelvis was performed using the standard protocol following bolus administration of intravenous contrast. CONTRAST:  134mL ISOVUE-300 IOPAMIDOL (ISOVUE-300) INJECTION 61% COMPARISON:  None. FINDINGS: Lower chest: There is minimal dependent subsegmental atelectasis in the lung bases. The imaged heart is unremarkable. Hepatobiliary: The liver and gallbladder are unremarkable. There is no biliary ductal dilatation. Pancreas: Unremarkable. Spleen: Unremarkable. Adrenals/Urinary Tract: The adrenals are unremarkable. The kidneys are unremarkable, with no focal lesion, stone, hydronephrosis, or hydroureter. The bladder is unremarkable. Stomach/Bowel: The stomach is significantly distended with enteric contrast. There is refluxed enteric contrast into the esophagus. The terminal ileum is herniated into the right inguinal canal with dilation of the upstream small bowel with the largest loop measuring up to 3.3 cm in diameter. There is fecalized material within this dilated small bowel. There is no pneumatosis intestinalis. There is extensive colonic diverticulosis without evidence of acute diverticulitis. Vascular/Lymphatic: There is scattered calcified atherosclerotic plaque throughout the nonaneurysmal abdominal aorta. The major branch vessels are patent. The main portal and splenic veins are patent.  There is no portal venous gas. There is no abdominal or pelvic lymphadenopathy. Reproductive: The prostate and seminal vesicles are unremarkable. Other: There is trace free fluid in the right lower quadrant, likely reactive (2-77). There is no free intraperitoneal air. Musculoskeletal: There is multilevel degenerative change throughout the imaged spine with fusion of the vertebral bodies anteriorly. There is also partial fusion of the SI joints. IMPRESSION: 1. The distal  ileum is herniated into the inguinal canal with evidence of incarceration and small-bowel obstruction as above. No pneumatosis intestinalis or portal venous gas. 2. Diverticulosis without evidence of acute diverticulitis. 3. 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 These results were called by telephone at the time of interpretation on 09/12/2021 at 5:01 pm to provider Dr Theda Sers , who verbally acknowledged these results. Electronically Signed   By: Valetta Mole M.D.   On: 09/12/2021 17:01    ROS 10 point review of systems is negative except as listed above in HPI.   Physical Exam Blood pressure 132/79, pulse 88, temperature 99.2 F (37.3 C), temperature source Oral, resp. rate 16, SpO2 97 %. Constitutional: well-developed, well-nourished HEENT: pupils equal, round, reactive to light, 72mm b/l, moist conjunctiva, external inspection of ears and nose normal, hearing intact Oropharynx: normal oropharyngeal mucosa, normal dentition Neck: no thyromegaly, trachea midline, no midline cervical tenderness to palpation Chest: breath sounds equal bilaterally, normal respiratory effort, no midline or lateral chest wall tenderness to palpation/deformity Abdomen: soft, NT, no bruising, no hepatosplenomegaly GU: no blood at urethral meatus of penis, non-reducible, tender R scrotal masses  Back: no wounds, no thoracic/lumbar spine tenderness to palpation, no thoracic/lumbar spine stepoffs Rectal: deferred Extremities: 2+ radial  and pedal pulses bilaterally, intact motor and sensation bilateral UE and LE, no peripheral edema MSK: normal gait/station, no clubbing/cyanosis of fingers/toes, normal ROM of all four extremities Skin: warm, dry, no rashes Psych: normal memory, normal mood/affect    Assessment/Plan: 78M with incarcerated R inguinal hernia. Non-reducible on exam. Plan for open RIHR, possible mesh implantation, possible exlap, possible bowel resection. Informed consent was obtained after detailed explanation of risks, including bleeding, infection, hematoma/seroma, decreased fertility, temporary or permanent neuropathy, hernia recurrence, and mesh infection requiring explantation. All questions answered to the patient's satisfaction. Wife present at bedside and all questions answered.    Jesusita Oka, MD [facility]

## 2021-09-12 NOTE — Progress Notes (Signed)
Acute Office Visit  Subjective:    Patient ID: Juan [name], male    DOB: 01/28/1951, 70 y.o.   MRN: [id number]  Chief Complaint  Patient presents with   Abdominal Pain    Started last night 09/11/21 when he went to get in bed. Sudden pain in the lower right quadrant, constant pain, kept him up at night, could not get comfortable. Dry mouth and not feeling well today. No bowel issues, no nausea, no urinary discomfort or vomiting.    Abdominal Pain Pertinent negatives include no diarrhea, dysuria, fever, hematuria, nausea or vomiting.  Patient is in today for Abdominal pain  Started around 11 when he went to bed. States that it is a dull aching pain that is constant.   Past Medical History:  Diagnosis Date   CAD (coronary artery disease)    a. RCA occlusion with 2 drug-eluting stents 2004;  b. 90% stenosis distal to the stents in the same artery treated with drug-eluting stenting February 2012;  .c 12/2013 Cath: nonobs dzs.   Complication of anesthesia    Diverticulosis of colon    GERD (gastroesophageal reflux disease)    Hyperlipidemia    Hypertension    Melanoma ([facility])    Myocardial infarction ([facility]) 2004   PONV (postoperative nausea and vomiting)    Age 53- not since    Past Surgical History:  Procedure Laterality Date   COLONOSCOPY     ESOPHAGOSCOPY N/A 05/31/2015   Procedure: ESOPHAGOSCOPY;  Surgeon: Izora Gala, MD;  Location: [facility];  Service: ENT;  Laterality: N/A;   LEFT HEART CATHETERIZATION WITH CORONARY ANGIOGRAM N/A 12/14/2013   Procedure: LEFT HEART CATHETERIZATION WITH CORONARY ANGIOGRAM;  Surgeon: Sinclair Grooms, MD;  Location: [facility] CATH LAB;  Service: Cardiovascular;  Laterality: N/A;   MELANOMA EXCISION     3 seperate ones   PTCA  2004   wisdom teeth removal      Family History  Problem Relation Age of Onset   Heart attack Father 101       died   Stroke Mother    Hypertension Mother    Hypertension Brother    Cancer Brother        prostate    Colon cancer Neg Hx    Pancreatic cancer Neg Hx    Stomach cancer Neg Hx    Esophageal cancer Neg Hx    Rectal cancer Neg Hx    Diabetes Neg Hx     Social History   Socioeconomic History   Marital status: Married    Spouse name: Not on file   Number of children: 1   Years of education: Not on file   Highest education level: Not on file  Occupational History   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  Tobacco Use   Smoking status: Never   Smokeless tobacco: Never  Vaping Use   Vaping Use: Never used  Substance and Sexual Activity   Alcohol use: Yes    Alcohol/week: 1.0 standard drink    Types: 1 Cans of beer per week   Drug use: No   Sexual activity: Not on file  Other Topics Concern   Not on file  Social History Narrative   No living will or health care POA   Would want wife to make decisions--then daughter   Would accept resuscitation attempts   Would accept at least short term trial of tube feeds   Social Determinants of Health  Financial Resource Strain: Not on file  Food Insecurity: Not on file  Transportation Needs: Not on file  Physical Activity: Not on file  Stress: Not on file  Social Connections: Not on file  Intimate Partner Violence: Not on file    Outpatient Medications Prior to Visit  Medication Sig Dispense Refill   aspirin EC 81 MG tablet Take 81 mg by mouth daily.     atorvastatin (LIPITOR) 20 MG tablet Take 1 tablet (20 mg total) by mouth daily. 90 tablet 2   ramipril (ALTACE) 2.5 MG capsule TAKE 1 CAPSULE BY MOUTH EVERY DAY 90 capsule 3   No facility-administered medications prior to visit.    No Known Allergies  Review of Systems  Constitutional:  Positive for chills. Negative for fever.  Respiratory:  Negative for cough and shortness of breath.   Cardiovascular:  Negative for chest pain.  Gastrointestinal:  Positive for abdominal pain. Negative for diarrhea, nausea and vomiting.  Genitourinary:   Negative for dysuria and hematuria.  Skin:  Negative for color change and pallor.      Objective:    Physical Exam Vitals and nursing note reviewed. Exam conducted with a chaperone present [facility], CMA).  Constitutional:      Appearance: He is well-developed.  Cardiovascular:     Rate and Rhythm: Normal rate and regular rhythm.  Pulmonary:     Effort: Pulmonary effort is normal.     Breath sounds: Normal breath sounds.  Abdominal:     General: Bowel sounds are normal.     Palpations: Abdomen is soft.     Tenderness: There is abdominal tenderness in the right lower quadrant. There is no right CVA tenderness, left CVA tenderness or guarding.     Hernia: A hernia (right inguinal and scrotal) is present. Hernia is present in the right inguinal area (right scrotal).    Genitourinary:    Penis: Normal.      Testes:        Right: Tenderness or swelling not present.        Left: Mass, tenderness or swelling not present.  Skin:    General: Skin is warm.  Neurological:     General: No focal deficit present.     Mental Status: He is alert.  Psychiatric:        Mood and Affect: Mood normal.        Behavior: Behavior normal.        Thought Content: Thought content normal.    BP 130/72   Pulse 77   Temp (!) 97.2 F (36.2 C)   Resp 12   Ht 5' 11"  (1.803 m)   Wt 200 lb (90.7 kg)   SpO2 96%   BMI 27.89 kg/m  Wt Readings from Last 3 Encounters:  09/12/21 200 lb (90.7 kg)  06/13/21 197 lb (89.4 kg)  02/03/21 198 lb 9.6 oz (90.1 kg)    Health Maintenance Due  Topic Date Due   Hepatitis C Screening  Never done   Zoster Vaccines- Shingrix (1 of 2) Never done   COVID-19 Vaccine (3 - Booster for Moderna series) 08/26/2020   TETANUS/TDAP  06/04/2021   INFLUENZA VACCINE  07/03/2021    There are no preventive care reminders to display for this patient.   Lab Results  Component Value Date   TSH 3.02 08/04/2013   Lab Results  Component Value Date   WBC 8.4  02/03/2021   HGB 15.3 02/03/2021   HCT 48.6 02/03/2021  MCV 86 02/03/2021   PLT 324 02/03/2021   Lab Results  Component Value Date   NA 141 02/03/2021   K 5.0 02/03/2021   CO2 26 02/03/2021   GLUCOSE 98 02/03/2021   BUN 10 02/03/2021   CREATININE 0.95 02/03/2021   BILITOT 0.5 02/03/2021   ALKPHOS 94 02/03/2021   AST 18 02/03/2021   ALT 18 02/03/2021   PROT 7.1 02/03/2021   ALBUMIN 4.3 02/03/2021   CALCIUM 9.5 02/03/2021   ANIONGAP 7 05/30/2015   EGFR 86 02/03/2021   GFR 76.31 06/07/2020   Lab Results  Component Value Date   CHOL 127 02/03/2021   Lab Results  Component Value Date   HDL 50 02/03/2021   Lab Results  Component Value Date   LDLCALC 62 02/03/2021   Lab Results  Component Value Date   TRIG 75 02/03/2021   Lab Results  Component Value Date   CHOLHDL 2.5 02/03/2021   No results found for: HGBA1C     Assessment & Plan:   Problem List Items Addressed This Visit       Other   Indigestion - Primary    Patient was experiencing some indigestion this morning.  States last time he did when he was having his heart attack.  Denies any chest pain or shortness of breath.  Patient is EKG looked okay in office.  Patient states he did have just some water and vitamin C supplement this morning.  We will continue to monitor signs and symptoms reviewed as to when to seek emergent help      Relevant Orders   EKG 12-Lead (Completed)   Right lower quadrant abdominal pain    Patient has concerning signs and symptoms of right lower quadrant pain that was dull aching and consistent in nature.  Concern for appendicitis versus possible hernia complications.  Upon exam patient seems to have a right inguinal and scrotal hernia that patient was unaware of.  We will draw labs pending results order CT scan of abdomen pelvis with contrast.  Gust this when to seek emergent health care if symptoms change or forgetting results.      Relevant Orders   CBC with  Differential/Platelet   Comprehensive metabolic panel   Lipase   POCT URINALYSIS DIP (CLINITEK) (Completed)   CT Abdomen Pelvis W Contrast   Abnormal urine finding    We will send off for urine microscopy and culture.      Relevant Orders   Urine Microscopic   Urine Culture     No orders of the defined types were placed in this encounter.  This visit occurred during the SARS-CoV-2 public health emergency.  Safety protocols were in place, including screening questions prior to the visit, additional usage of staff PPE, and extensive cleaning of exam room while observing appropriate contact time as indicated for disinfecting solutions.   Romilda Garret, NP

## 2021-09-12 NOTE — Telephone Encounter (Signed)
[facility] Night - Client TELEPHONE ADVICE RECORD [facility] Patient Name: Juan [name] [place] Gender: Male DOB: 1951-08-15 Age: 70 Y 2 M 11 D Return Phone Number: [phone] (Primary) Address: City/ State/ Zip: [place] [address] Client [facility] Night - Client Client Site [place] Physician Viviana Simpler- MD Contact Type Call Who Is Calling Patient / Member / Family / Caregiver Call Type Triage / Clinical Relationship To Patient Self Return Phone Number [phone] (Primary) Chief Complaint Abdominal Pain Reason for Call Symptomatic / Request for [place] states, pt having low abd pain - not severe, didnt not sleep. Translation No Nurse Assessment Nurse: Acey Lav, RN, Estill Bamberg Date/Time (Eastern Time): 09/12/2021 7:23:57 AM Confirm and document reason for call. If symptomatic, describe symptoms. ---Caller states he was having low abd pain could not sleep. Right side of stomach. Constant pain, achy pain. 0-10 pain scale: 3-4. Thirsty too. Looked online seen appendicitis. Does the patient have any new or worsening symptoms? ---Yes Will a triage be completed? ---Yes Related visit to physician within the last 2 weeks? ---N/A Does the PT have any chronic conditions? (i.e. diabetes, asthma, this includes High risk factors for pregnancy, etc.) ---Yes List chronic conditions. ---18 years: mild MI. cholesterol and HTN. Is this a behavioral health or substance abuse call? ---No Guidelines Guideline Title Affirmed Question Affirmed Notes Nurse Date/Time Eilene Ghazi Time) Abdominal Pain - Male [1] MILDMODERATE pain AND [2] constant AND [3] present > 2 hours Acey Lav, RN, Estill Bamberg 09/12/2021 7:26:22 AM Disp. Time Eilene Ghazi Time) Disposition Final User PLEASE NOTE: All timestamps contained within this report are represented as [place] Standard  Time. CONFIDENTIALTY NOTICE: This fax transmission is intended only for the addressee. It contains information that is legally privileged, confidential or otherwise protected from use or disclosure. If you are not the intended recipient, you are strictly prohibited from reviewing, disclosing, copying using or disseminating any of this information or taking any action in reliance on or regarding this information. If you have received this fax in error, please notify us immediately by telephone so that we can arrange for its return to [place]. Phone: [phone], Toll-Free: [phone], Fax: [phone] Page: 2 of 2 Call Id: [id number] 09/12/2021 7:29:53 AM See HCP within 4 Hours (or PCP triage) Yes Acey Lav, RN, Shelly Coss Disagree/Comply Comply Caller Understands Yes PreDisposition Search internet for information Care Advice Given Per Guideline SEE HCP (OR PCP TRIAGE) WITHIN 4 HOURS: * IF OFFICE WILL BE OPEN: You need to be seen within the next 3 or 4 hours. Call your doctor (or NP/PA) now or as soon as the office opens. CARE ADVICE given per Abdominal Pain, Male (Adult) guideline. NOTHING BY MOUTH: * Do not eat or drink anything for now. * Rest until you are seen. REST: CALL BACK IF: * You become worse Referrals REFERRED TO PCP OFFICE

## 2021-09-12 NOTE — Patient Instructions (Signed)
Nice to see you  We will be in touch in regards to labs and scan. Likely be at [address], but they will confirm

## 2021-09-12 NOTE — Anesthesia Procedure Notes (Signed)
Procedure Name: Intubation Date/Time: 09/12/2021 10:26 PM Performed by: Markus Casten T, CRNA Pre-anesthesia Checklist: Patient identified, Emergency Drugs available, Suction available and Patient being monitored Patient Re-evaluated:Patient Re-evaluated prior to induction Oxygen Delivery Method: Circle system utilized Preoxygenation: Pre-oxygenation with 100% oxygen Induction Type: IV induction and Rapid sequence Ventilation: Mask ventilation without difficulty Laryngoscope Size: Glidescope and 3 Grade View: Grade I Tube type: Oral Tube size: 7.5 mm Number of attempts: 1 Airway Equipment and Method: Stylet and Oral airway Placement Confirmation: ETT inserted through vocal cords under direct vision, positive ETCO2 and breath sounds checked- equal and bilateral Secured at: 23 cm Tube secured with: Tape Dental Injury: Teeth and Oropharynx as per pre-operative assessment  Difficulty Due To: Difficulty was anticipated, Difficult Airway- due to anterior larynx and Difficult Airway- due to limited oral opening Future Recommendations: Recommend- induction with short-acting agent, and alternative techniques readily available

## 2021-09-13 ENCOUNTER — Encounter (HOSPITAL_COMMUNITY): Payer: Self-pay | Admitting: Surgery

## 2021-09-13 DIAGNOSIS — Z79899 Other long term (current) drug therapy: Secondary | ICD-10-CM | POA: Diagnosis not present

## 2021-09-13 DIAGNOSIS — I2510 Atherosclerotic heart disease of native coronary artery without angina pectoris: Secondary | ICD-10-CM | POA: Diagnosis not present

## 2021-09-13 DIAGNOSIS — Z20822 Contact with and (suspected) exposure to covid-19: Secondary | ICD-10-CM | POA: Diagnosis not present

## 2021-09-13 DIAGNOSIS — Z9889 Other specified postprocedural states: Secondary | ICD-10-CM

## 2021-09-13 DIAGNOSIS — Z8719 Personal history of other diseases of the digestive system: Secondary | ICD-10-CM

## 2021-09-13 DIAGNOSIS — Z7982 Long term (current) use of aspirin: Secondary | ICD-10-CM | POA: Diagnosis not present

## 2021-09-13 DIAGNOSIS — I10 Essential (primary) hypertension: Secondary | ICD-10-CM | POA: Diagnosis not present

## 2021-09-13 DIAGNOSIS — K4030 Unilateral inguinal hernia, with obstruction, without gangrene, not specified as recurrent: Secondary | ICD-10-CM | POA: Diagnosis not present

## 2021-09-13 LAB — URINE CULTURE
MICRO NUMBER:: 12487982
SPECIMEN QUALITY:: ADEQUATE

## 2021-09-13 MED ORDER — ONDANSETRON 4 MG PO TBDP: 4.0000 mg | ORAL_TABLET | Freq: Four times a day (QID) | ORAL | Status: DC | PRN

## 2021-09-13 MED ORDER — DOCUSATE SODIUM 100 MG PO CAPS
100.0000 mg | ORAL_CAPSULE | Freq: Two times a day (BID) | ORAL | Status: DC
  Administered 2021-09-13: 100 mg via ORAL
  Filled 2021-09-13: qty 1

## 2021-09-13 MED ORDER — METHOCARBAMOL 500 MG PO TABS
1000.0000 mg | ORAL_TABLET | Freq: Three times a day (TID) | ORAL | Status: DC
  Administered 2021-09-13 (×2): 1000 mg via ORAL
  Filled 2021-09-13 (×2): qty 2

## 2021-09-13 MED ORDER — MORPHINE SULFATE (PF) 2 MG/ML IV SOLN: 2.0000 mg | INTRAVENOUS | Status: DC | PRN

## 2021-09-13 MED ORDER — FENTANYL CITRATE (PF) 100 MCG/2ML IJ SOLN
INTRAMUSCULAR | Status: AC
  Filled 2021-09-13: qty 2

## 2021-09-13 MED ORDER — TRAMADOL HCL 50 MG PO TABS: 50.0000 mg | ORAL_TABLET | Freq: Four times a day (QID) | ORAL | 0 refills | Status: AC | PRN

## 2021-09-13 MED ORDER — DOCUSATE SODIUM 100 MG PO CAPS: 100.0000 mg | ORAL_CAPSULE | Freq: Two times a day (BID) | ORAL | Status: DC

## 2021-09-13 MED ORDER — 0.9 % SODIUM CHLORIDE (POUR BTL) OPTIME
TOPICAL | Status: DC | PRN
  Administered 2021-09-13: 1000 mL

## 2021-09-13 MED ORDER — OXYCODONE HCL 5 MG/5ML PO SOLN: 2.5000 mg | ORAL | Status: DC | PRN

## 2021-09-13 MED ORDER — IBUPROFEN 800 MG PO TABS: 800.0000 mg | ORAL_TABLET | Freq: Three times a day (TID) | ORAL | Status: AC | PRN

## 2021-09-13 MED ORDER — ENOXAPARIN SODIUM 40 MG/0.4ML IJ SOSY: 40.0000 mg | PREFILLED_SYRINGE | INTRAMUSCULAR | Status: DC

## 2021-09-13 MED ORDER — ONDANSETRON HCL 4 MG/2ML IJ SOLN: 4.0000 mg | Freq: Four times a day (QID) | INTRAMUSCULAR | Status: DC | PRN

## 2021-09-13 MED ORDER — BUPIVACAINE-EPINEPHRINE 0.25% -1:200000 IJ SOLN
INTRAMUSCULAR | Status: DC | PRN
  Administered 2021-09-13: 30 mL

## 2021-09-13 MED ORDER — LACTATED RINGERS IV SOLN: INTRAVENOUS | Status: DC

## 2021-09-13 MED ORDER — ACETAMINOPHEN 10 MG/ML IV SOLN
INTRAVENOUS | Status: DC | PRN
  Administered 2021-09-13: 1000 mg via INTRAVENOUS

## 2021-09-13 MED ORDER — ACETAMINOPHEN 500 MG PO TABS
1000.0000 mg | ORAL_TABLET | Freq: Four times a day (QID) | ORAL | Status: DC
  Administered 2021-09-13: 1000 mg via ORAL
  Filled 2021-09-13: qty 2

## 2021-09-13 MED ORDER — SUGAMMADEX SODIUM 200 MG/2ML IV SOLN
INTRAVENOUS | Status: DC | PRN
  Administered 2021-09-13: 300 mg via INTRAVENOUS

## 2021-09-13 NOTE — Progress Notes (Signed)
Juan [name] to be D/C'd per MD order. Discussed with the patient and all questions fully answered. ? VSS, Skin clean, dry and intact without evidence of skin break down, no evidence of skin tears noted. ? IV catheter discontinued intact. Site without signs and symptoms of complications. Dressing and pressure applied. ? An After Visit Summary was printed and given to the patient. Patient informed where to pickup prescriptions. ? D/c education completed with patient/family including follow up instructions, medication list, d/c activities limitations if indicated, with other d/c instructions as indicated by MD - patient able to verbalize understanding, all questions fully answered.  ? Patient instructed to return to ED, call 911, or call MD for any changes in condition.  ? Patient to be escorted via [place], and D/C home via private auto.

## 2021-09-13 NOTE — Discharge Instructions (Addendum)
[facility]  UMBILICAL [facility] INGUINAL HERN[facility] REPAIR: POST OP INSTRUCTIONS  Always review your discharge instruction sheet given to you by the facility where your surgery was performed. IF YOU HAVE DISABILITY OR FAMILY LEAVE FORMS, YOU MUST BRING THEM TO THE OFFICE FOR PROCESSING.   DO NOT GIVE THEM TO YOUR DOCTOR.  A  prescription for pain medication may be given to you upon discharge.  Take your pain medication as prescribed, if needed.  If narcotic pain medicine is not needed, then you may take acetaminophen (Tylenol), naprosyn (Alleve) or ibuprofen (Advil) as needed. Take your usually prescribed medications unless otherwise directed. If you need a refill on your pain medication, please contact your pharmacy.  They will contact our office to request authorization. Prescriptions will not be filled after 5 pm or on week-ends. You should follow a light diet the first 24 hours after arrival home, such as soup and crackers, etc.  Be sure to include lots of fluids daily.  Resume your normal diet the day after surgery. Most patients will experience some swelling and bruising around the umbilicus or in the groin and scrotum.  Ice packs and reclining will help.  Swelling and bruising can take several days to resolve.  It is common to experience some constipation if taking pain medication after surgery.  Increasing fluid intake and taking a stool softener (such as Colace) will usually help or prevent this problem from occurring.  A mild laxative (Milk of Magnesia or Miralax) should be taken according to package directions if there are no bowel movements after 48 hours. Unless discharge instructions indicate otherwise, you may remove your bandages 48 hours after surgery, and you may shower at that time.  You may have steri-strips (small skin tapes) in place directly over the incision.  These strips should be left on the skin for 7-10 days and will come off on their own.  If your surgeon used  skin glue on the incision, you may shower in 24 hours.  The glue will flake off over the next 2-3 weeks.  Any sutures or staples will be removed at the office during your follow-up visit. ACTIVITIES:  You may resume regular (light) daily activities beginning the next day--such as daily self-care, walking, climbing stairs--gradually increasing activities as tolerated.  You may have sexual intercourse when it is comfortable.  Refrain from any heavy lifting or straining until approved by your doctor. You may drive when you are no longer taking prescription pain medication, you can comfortably wear a seatbelt, and you can safely maneuver your car and apply brakes. RETURN TO WORK:  __________________________________________________________ Dennis Bast should see your doctor in the office for a follow-up appointment approximately 2-3 weeks after your surgery.  Make sure that you call for this appointment within a day or two after you arrive home to insure a convenient appointment time. OTHER INSTRUCTIONS:  __________________________________________________________________________________________________________________________________________________________________________________________  WHEN TO CALL YOUR DOCTOR: Fever over 101.0 Inability to urinate Nausea and/or vomiting Extreme swelling or bruising Continued bleeding from incision. Increased pain, redness, or drainage from the incision  The clinic staff is available to answer your questions during regular business hours.  Please don't hesitate to call and ask to speak to one of the nurses for clinical concerns.  If you have a medical emergency, go to the nearest emergency room or call 911.  A surgeon from [facility] is always on call at the hospital   [address] [place], [place], [place]  [address] ?  [address], [place], [place]   [address] [phone] ? [phone] ? FAX [phone] Web site:  [email or web address]      Managing Your Pain After Surgery Without Opioids    Thank you for participating in our program to help patients manage their pain after surgery without opioids. This is part of our effort to provide you with the best care possible, without exposing you or your family to the risk that opioids pose.  What pain can I expect after surgery? You can expect to have some pain after surgery. This is normal. The pain is typically worse the day after surgery, and quickly begins to get better. Many studies have found that many patients are able to manage their pain after surgery with Over-the-Counter (OTC) medications such as Tylenol and Motrin. If you have a condition that does not allow you to take Tylenol or Motrin, notify your surgical team.  How will I manage my pain? The best strategy for controlling your pain after surgery is around the clock pain control with Tylenol (acetaminophen) and Motrin (ibuprofen or Advil). Alternating these medications with each other allows you to maximize your pain control. In addition to Tylenol and Motrin, you can use heating pads or ice packs on your incisions to help reduce your pain.  How will I alternate your regular strength over-the-counter pain medication? You will take a dose of pain medication every three hours. Start by taking 650 mg of Tylenol (2 pills of 325 mg) 3 hours later take 600 mg of Motrin (3 pills of 200 mg) 3 hours after taking the Motrin take 650 mg of Tylenol 3 hours after that take 600 mg of Motrin.   - 1 -  See example - if your first dose of Tylenol is at 12:00 PM   12:00 PM Tylenol 650 mg (2 pills of 325 mg)  3:00 PM Motrin 600 mg (3 pills of 200 mg)  6:00 PM Tylenol 650 mg (2 pills of 325 mg)  9:00 PM Motrin 600 mg (3 pills of 200 mg)  Continue alternating every 3 hours   We recommend that you follow this schedule around-the-clock for at least 3 days after surgery, or until you feel that  it is no longer needed. Use the table on the last page of this handout to keep track of the medications you are taking. Important: Do not take more than 3000mg  of Tylenol or 3200mg  of Motrin in a 24-hour period. Do not take ibuprofen/Motrin if you have a history of bleeding stomach ulcers, severe kidney disease, &/or actively taking a blood thinner  What if I still have pain? If you have pain that is not controlled with the over-the-counter pain medications (Tylenol and Motrin or Advil) you might have what we call "breakthrough" pain. You will receive a prescription for a small amount of an opioid pain medication such as Oxycodone, Tramadol, or Tylenol with Codeine. Use these opioid pills in the first 24 hours after surgery if you have breakthrough pain. Do not take more than 1 pill every 4-6 hours.  If you still have uncontrolled pain after using all opioid pills, don't hesitate to call our staff using the number provided. We will help make sure you are managing your pain in the best way possible, and if necessary, we can provide a prescription for additional pain medication.   Day 1    Time  Name of Medication Number of pills taken  Amount of Acetaminophen  Pain Level  Comments  AM PM       AM PM       AM PM       AM PM       AM PM       AM PM       AM PM       AM PM       Total Daily amount of Acetaminophen Do not take more than  3,000 mg per day      Day 2    Time  Name of Medication Number of pills taken  Amount of Acetaminophen  Pain Level   Comments  AM PM       AM PM       AM PM       AM PM       AM PM       AM PM       AM PM       AM PM       Total Daily amount of Acetaminophen Do not take more than  3,000 mg per day      Day 3    Time  Name of Medication Number of pills taken  Amount of Acetaminophen  Pain Level   Comments  AM PM       AM PM       AM PM       AM PM          AM PM       AM PM       AM PM       AM PM       Total Daily  amount of Acetaminophen Do not take more than  3,000 mg per day      Day 4    Time  Name of Medication Number of pills taken  Amount of Acetaminophen  Pain Level   Comments  AM PM       AM PM       AM PM       AM PM       AM PM       AM PM       AM PM       AM PM       Total Daily amount of Acetaminophen Do not take more than  3,000 mg per day      Day 5    Time  Name of Medication Number of pills taken  Amount of Acetaminophen  Pain Level   Comments  AM PM       AM PM       AM PM       AM PM       AM PM       AM PM       AM PM       AM PM       Total Daily amount of Acetaminophen Do not take more than  3,000 mg per day       Day 6    Time  Name of Medication Number of pills taken  Amount of Acetaminophen  Pain Level  Comments  AM PM       AM PM       AM PM       AM PM       AM PM       AM PM       AM  PM       AM PM       Total Daily amount of Acetaminophen Do not take more than  3,000 mg per day      Day 7    Time  Name of Medication Number of pills taken  Amount of Acetaminophen  Pain Level   Comments  AM PM       AM PM       AM PM       AM PM       AM PM       AM PM       AM PM       AM PM       Total Daily amount of Acetaminophen Do not take more than  3,000 mg per day        For additional information about how and where to safely dispose of unused opioid medications - [employer]  Disclaimer: This document contains information and/or instructional materials adapted from [employer] for the typical patient with your condition. It does not replace medical advice from your health care provider because your experience may differ from that of the typical patient. Talk to your health care provider if you have any questions about this document, your condition or your treatment plan. Adapted from [employer]

## 2021-09-13 NOTE — Op Note (Signed)
   Operative Note   Date: 09/13/2021  Procedure: open right inguinal hernia repair with mesh  Pre-op diagnosis: incarcerate right inguinal hernia Post-op diagnosis: incarcerated indirect inguinal herni  Indication and clinical history: The patient is a 70 y.o. year old male with an incarcerated right inguinal hernia     Surgeon: Jesusita Oka, MD  Anesthesiologist: Gifford Shave, MD Anesthesia: General  Findings:  Specimen: none EBL: <5cc Drains/Implants: 3x6 ultrapro mesh  Disposition: PACU - hemodynamically stable.  Description of procedure: The patient was positioned supine on the operating room table. General anesthetic induction and intubation were uneventful. Foley catheter insertion was performed and was atraumatic. Time-out was performed verifying correct patient, procedure, signature of informed consent, and administration of pre-operative antibiotics. The abdomen and right groin were prepped and draped in the usual sterile fashion.  An incision was made approximately 2/3 distance from the ASIS to the pubic tubercle and deepened through Scarpa's fascia until the external oblique aponeurosis was reached. The external oblique aponeurosis was entered sharply and opened using Metzenbaum scissors, so as to avoid injuring the ilioinguinal nerve. The spermatic cord was isolated and encircled with a Penrose drain. During mobilization of the cord, the incarcerated indirect hernia was reduced. The hernia sac was isolated and a high ligation performed. A 3x6 mesh was cut to fit and sutured to the pubic tubercle with a #1 novofil suture and run along the shelving edge inferiorly and the conjoined tendon superiorly. A slit was made in the center of the mesh to accommodate the cord structures and a vicryl suture was used to create a snug fit around it. The ends of the mesh were overlapped beneath the external oblique aponeurosis. The external oblique was closed with a 2-0 vicryl suture and Scarpa's  closed with a 3-0 vicryl. Local anesthetic was infiltrated into the surrounding tissues. The skin was closed with 4-0 monocryl suture and dermabond applied as dressing.   All sponge and instrument counts were correct at the conclusion of the procedure. The patient was awakened from anesthesia, extubated uneventfully, and transported to the PACU in good condition. There were no complications.     Jesusita Oka, MD [facility]

## 2021-09-13 NOTE — Transfer of Care (Signed)
Immediate Anesthesia Transfer of Care Note  Patient: Juan [name]   Procedure(s) Performed: HERNIA REPAIR INGUINAL ADULT (Right)  Patient Location: PACU  Anesthesia Type:General  Level of Consciousness: drowsy  Airway & Oxygen Therapy: Patient Spontanous Breathing and Patient connected to nasal cannula oxygen  Post-op Assessment: Report given to RN, Post -op Vital signs reviewed and stable and Patient moving all extremities  Post vital signs: Reviewed and stable  Last Vitals:  Vitals Value Taken Time  BP 134/69 09/13/21 0043  Temp    Pulse 83 09/13/21 0048  Resp 16 09/13/21 0048  SpO2 94 % 09/13/21 0048  Vitals shown include unvalidated device data.  Last Pain:  Vitals:   09/12/21 2155  TempSrc:   PainSc: 2          Complications: No notable events documented.

## 2021-09-13 NOTE — Progress Notes (Signed)
Juan [name] is a 70 y.o. male patient admitted. Awake, alert - oriented  X 4 - no acute distress noted.  VSS - Blood pressure 118/66, pulse 77, temperature 98.2 F (36.8 C), temperature source Oral, resp. rate 19, SpO2 96 %.    IV in place, occlusive dsg intact without redness.  Orientation to room, and floor completed.  Admission INP armband ID verified with patient/family, and in place.   SR up x 2, fall assessment complete, with patient and family able to verbalize understanding of risk associated with falls, and verbalized understanding to call nsg before up out of bed.  Call light within reach, patient able to voice, and demonstrate understanding. No evidence of skin break down noted on exam.   Will cont to eval and treat per MD orders.  Minna Antis, RN 09/13/2021 3:41 AM

## 2021-09-13 NOTE — Discharge Summary (Signed)
[facility] Discharge Summary   Patient ID: NAHSIR Juan [name] MRN: [id number] DOB/AGE: 1951-06-22 70 y.o.  Admit date: 09/12/2021 Discharge date: 09/13/2021  Admitting Diagnosis: Incarcerated inguinal hernia [K40.30]   Discharge Diagnosis Incarcerated inguinal hernia [K40.30] s/p open right inguinal hernia repair with mesh  Consultants None   Imaging: CT Abdomen Pelvis W Contrast  Result Date: 09/12/2021 CLINICAL DATA:  Right lower quadrant pain, concern for appendicitis or hernia EXAM: CT ABDOMEN AND PELVIS WITH CONTRAST TECHNIQUE: Multidetector CT imaging of the abdomen and pelvis was performed using the standard protocol following bolus administration of intravenous contrast. CONTRAST:  151mL ISOVUE-300 IOPAMIDOL (ISOVUE-300) INJECTION 61% COMPARISON:  None. FINDINGS: Lower chest: There is minimal dependent subsegmental atelectasis in the lung bases. The imaged heart is unremarkable. Hepatobiliary: The liver and gallbladder are unremarkable. There is no biliary ductal dilatation. Pancreas: Unremarkable. Spleen: Unremarkable. Adrenals/Urinary Tract: The adrenals are unremarkable. The kidneys are unremarkable, with no focal lesion, stone, hydronephrosis, or hydroureter. The bladder is unremarkable. Stomach/Bowel: The stomach is significantly distended with enteric contrast. There is refluxed enteric contrast into the esophagus. The terminal ileum is herniated into the right inguinal canal with dilation of the upstream small bowel with the largest loop measuring up to 3.3 cm in diameter. There is fecalized material within this dilated small bowel. There is no pneumatosis intestinalis. There is extensive colonic diverticulosis without evidence of acute diverticulitis. Vascular/Lymphatic: There is scattered calcified atherosclerotic plaque throughout the nonaneurysmal abdominal aorta. The major branch vessels are patent. The main portal and splenic veins are patent. There is no portal  venous gas. There is no abdominal or pelvic lymphadenopathy. Reproductive: The prostate and seminal vesicles are unremarkable. Other: There is trace free fluid in the right lower quadrant, likely reactive (2-77). There is no free intraperitoneal air. Musculoskeletal: There is multilevel degenerative change throughout the imaged spine with fusion of the vertebral bodies anteriorly. There is also partial fusion of the SI joints. IMPRESSION: 1. The distal ileum is herniated into the inguinal canal with evidence of incarceration and small-bowel obstruction as above. No pneumatosis intestinalis or portal venous gas. 2. Diverticulosis without evidence of acute diverticulitis. 3. 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 These results were called by telephone at the time of interpretation on 09/12/2021 at 5:01 pm to provider Dr Theda Sers , who verbally acknowledged these results. Electronically Signed   By: Valetta Mole M.D.   On: 09/12/2021 17:01    Procedures Dr. Bobbye Morton (09/13/21) - open right inguinal hernia repair with mesh  Hospital Course:  70 year old male who presented to [facility] ED with right groin discomfort.  Workup showed incarcerated inguinal hernia.  Patient was admitted and underwent procedure listed above.  Tolerated procedure well and was transferred to the floor.  Diet was advanced as tolerated.  On the date of his surgery, the patient was voiding well, tolerating diet, ambulating well, pain well controlled, vital signs stable, incision c/d/i and felt stable for discharge home.  Patient will follow up in our office in 3 weeks and knows to call with questions or concerns.  He will call to confirm appointment date/time.    He was discharged with tramadol to alternate with tylenol and ibuprofen for pain control. He will take a stool softener  Physical Exam: General:  Alert, NAD, pleasant, comfortable Abd:  Soft, ND, no abdominal tenderness to palpation GU: R inguinal  incision C/D/I mildly TTP around incision. No erythema, discharge, drainage   I or a member of my  team have reviewed this patient in the Controlled Substance Database.   Allergies as of 09/13/2021   No Known Allergies      Medication List     TAKE these medications    acetaminophen 325 MG tablet Commonly known as: TYLENOL Take 650 mg by mouth every 6 (six) hours as needed for headache, mild pain or fever.   aspirin EC 81 MG tablet Take 81 mg by mouth daily.   atorvastatin 20 MG tablet Commonly known as: LIPITOR Take 1 tablet (20 mg total) by mouth daily.   docusate sodium 100 MG capsule Commonly known as: COLACE Take 1 capsule (100 mg total) by mouth 2 (two) times daily.   Emergen-C Immune Plus Pack Take 1 packet by mouth in the morning.   ibuprofen 800 MG tablet Commonly known as: ADVIL Take 1 tablet (800 mg total) by mouth every 8 (eight) hours as needed for up to 5 days for mild pain or moderate pain.   ramipril 2.5 MG capsule Commonly known as: ALTACE TAKE 1 CAPSULE BY MOUTH EVERY DAY What changed:  how much to take how to take this when to take this additional instructions   traMADol 50 MG tablet Commonly known as: Ultram Take 1 tablet (50 mg total) by mouth every 6 (six) hours as needed for up to 5 days.          Follow-up Information     [facility] [place]. Call.   Specialty: General Surgery Why: we are working to make your follow up appointment, please call to confirm appointment date and time Contact information: [address] [place] [address] [phone]                 Signed: Winferd Humphrey , [facility] 09/13/2021, 11:44 AM Please see [employer] for pager number during day hours 7:00am-4:30pm

## 2021-09-13 NOTE — Anesthesia Postprocedure Evaluation (Signed)
Anesthesia Post Note  Patient: Juan [name]  Procedure(s) Performed: HERNIA REPAIR INGUINAL ADULT (Right)     Patient location during evaluation: PACU Anesthesia Type: General Level of consciousness: awake and alert Pain management: pain level controlled Vital Signs Assessment: post-procedure vital signs reviewed and stable Respiratory status: spontaneous breathing, nonlabored ventilation and respiratory function stable Cardiovascular status: blood pressure returned to baseline and stable Postop Assessment: no apparent nausea or vomiting Anesthetic complications: no   No notable events documented.  Last Vitals:  Vitals:   09/13/21 0607 09/13/21 0818  BP: 122/70 121/71  Pulse: 76 80  Resp: 18 17  Temp: 36.7 C (!) 36.4 C  SpO2: 96% 95%    Last Pain:  Vitals:   09/13/21 0818  TempSrc: Oral  PainSc:                  Catalina Gravel

## 2021-11-04 ENCOUNTER — Other Ambulatory Visit: Payer: Self-pay | Admitting: Cardiology

## 2021-12-05 DIAGNOSIS — R972 Elevated prostate specific antigen [PSA]: Secondary | ICD-10-CM | POA: Diagnosis not present

## 2022-02-27 DIAGNOSIS — Z85828 Personal history of other malignant neoplasm of skin: Secondary | ICD-10-CM | POA: Diagnosis not present

## 2022-02-27 DIAGNOSIS — L918 Other hypertrophic disorders of the skin: Secondary | ICD-10-CM | POA: Diagnosis not present

## 2022-02-27 DIAGNOSIS — L821 Other seborrheic keratosis: Secondary | ICD-10-CM | POA: Diagnosis not present

## 2022-02-27 DIAGNOSIS — Z85820 Personal history of malignant melanoma of skin: Secondary | ICD-10-CM | POA: Diagnosis not present

## 2022-02-27 DIAGNOSIS — L570 Actinic keratosis: Secondary | ICD-10-CM | POA: Diagnosis not present

## 2022-02-27 DIAGNOSIS — L820 Inflamed seborrheic keratosis: Secondary | ICD-10-CM | POA: Diagnosis not present

## 2022-05-01 DIAGNOSIS — M25562 Pain in left knee: Secondary | ICD-10-CM | POA: Diagnosis not present

## 2022-05-01 DIAGNOSIS — M160 Bilateral primary osteoarthritis of hip: Secondary | ICD-10-CM | POA: Diagnosis not present

## 2022-05-09 ENCOUNTER — Other Ambulatory Visit: Payer: Self-pay | Admitting: Cardiology

## 2022-06-01 ENCOUNTER — Telehealth: Payer: Self-pay | Admitting: Internal Medicine

## 2022-06-01 NOTE — Telephone Encounter (Signed)
Patient left medical record release and I faxed to medical records.  [place]

## 2022-06-07 NOTE — Progress Notes (Signed)
Cardiology Office Note   Date:  06/08/2022   ID:  Juan, [name] 07/16/1951, MRN [id number]  PCP:  Venia Carbon, MD  Cardiologist:   None   Chief Complaint  Patient presents with   Coronary Artery Disease      History of Present Illness: Juan [name] is a 71 y.o. male who presents for follow up of CAD.   He had some vague symptoms when I saw him and he was to have a POET (Plain Old Exercise Treadmill).  However, he did not have this.  Since I last saw him he has had an incarcerated inguinal hernia urgently repaired.  His brother died soon after being diagnosed with brain cancer.  His wife fell and broke her femur.  He has had a lot of stress with all of this.  He has not had any new cardiovascular symptoms. The patient denies any new symptoms such as chest discomfort, neck or arm discomfort. There has been no new shortness of breath, PND or orthopnea. There have been no reported palpitations, presyncope or syncope.  He shovels stalls.  He is going to get a Ship broker.    Past Medical History:  Diagnosis Date   CAD (coronary artery disease)    a. RCA occlusion with 2 drug-eluting stents 2004;  b. 90% stenosis distal to the stents in the same artery treated with drug-eluting stenting February 2012;  .c 12/2013 Cath: nonobs dzs.   Complication of anesthesia    Diverticulosis of colon    GERD (gastroesophageal reflux disease)    Hyperlipidemia    Hypertension    Melanoma ([place])    Myocardial infarction ([place]) 2004   PONV (postoperative nausea and vomiting)    Age 67- not since    Past Surgical History:  Procedure Laterality Date   COLONOSCOPY     ESOPHAGOSCOPY N/A 05/31/2015   Procedure: ESOPHAGOSCOPY;  Surgeon: Izora Gala, MD;  Location: [place];  Service: ENT;  Laterality: N/A;   INGUINAL HERNIA REPAIR Right 09/12/2021   Procedure: HERNIA REPAIR INGUINAL ADULT;  Surgeon: Jesusita Oka, MD;  Location: [place];  Service: General;  Laterality: Right;   LEFT HEART  CATHETERIZATION WITH CORONARY ANGIOGRAM N/A 12/14/2013   Procedure: LEFT HEART CATHETERIZATION WITH CORONARY ANGIOGRAM;  Surgeon: Sinclair Grooms, MD;  Location: [facility] CATH LAB;  Service: Cardiovascular;  Laterality: N/A;   MELANOMA EXCISION     3 seperate ones   PTCA  2004   wisdom teeth removal       Current Outpatient Medications  Medication Sig Dispense Refill   acetaminophen (TYLENOL) 325 MG tablet Take 650 mg by mouth every 6 (six) hours as needed for headache, mild pain or fever.     aspirin EC 81 MG tablet Take 81 mg by mouth daily.     atorvastatin (LIPITOR) 20 MG tablet TAKE 1 TABLET BY MOUTH EVERY DAY 30 tablet 1   docusate sodium (COLACE) 100 MG capsule Take 1 capsule (100 mg total) by mouth 2 (two) times daily.     Multiple Vitamins-Minerals (EMERGEN-C IMMUNE PLUS) PACK Take 1 packet by mouth in the morning.     ramipril (ALTACE) 2.5 MG capsule TAKE 1 CAPSULE BY MOUTH EVERY DAY 90 capsule 1   No current facility-administered medications for this visit.    Allergies:   Patient has no known allergies.    ROS:  Please see the history of present illness.   Otherwise, review of systems are positive  for knee pain and hip problems.   All other systems are reviewed and negative.    PHYSICAL EXAM: VS:  BP 124/76   Pulse 77   Ht 5' 11"  (1.803 m)   Wt 191 lb 6.4 oz (86.8 kg)   SpO2 98%   BMI 26.69 kg/m  , BMI Body mass index is 26.69 kg/m. GENERAL:  Well appearing NECK:  No jugular venous distention, waveform within normal limits, carotid upstroke brisk and symmetric, no bruits, no thyromegaly LUNGS:  Clear to auscultation bilaterally CHEST:  Unremarkable HEART:  PMI not displaced or sustained,S1 and S2 within normal limits, no S3, no S4, no clicks, no rubs, no murmurs ABD:  Flat, positive bowel sounds normal in frequency in pitch, no bruits, no rebound, no guarding, no midline pulsatile mass, no hepatomegaly, no splenomegaly EXT:  2 plus pulses throughout, no edema, no  cyanosis no clubbing   EKG:  EKG is  ordered today. The ekg ordered today demonstrates normal sinus rhythm, rate 77, axis within normal limits, intervals within normal limits, no acute ST-T wave changes.   Recent Labs: 09/12/2021: ALT 18; BUN 7; Creatinine, Ser 0.94; Hemoglobin 15.2; Platelets 284; Potassium 4.3; Sodium 135    Lipid Panel    Component Value Date/Time   CHOL 127 02/03/2021 1012   TRIG 75 02/03/2021 1012   HDL 50 02/03/2021 1012   CHOLHDL 2.5 02/03/2021 1012   CHOLHDL 2 06/07/2020 0949   VLDL 19.0 06/07/2020 0949   LDLCALC 62 02/03/2021 1012      Wt Readings from Last 3 Encounters:  06/08/22 191 lb 6.4 oz (86.8 kg)  09/12/21 200 lb (90.7 kg)  06/13/21 197 lb (89.4 kg)      Other studies Reviewed: Additional studies/ records that were reviewed today include: Labs. Review of the above records demonstrates:  Please see elsewhere in the note.     ASSESSMENT AND PLAN:  CORONARY ARTERY DISEASE -  The patient has no new sypmtoms.  No further cardiovascular testing is indicated.  We will continue with aggressive risk reduction and meds as listed.  HYPERTENSION - His blood pressure is at target.  No change in therapy.  DYSLIPIDEMIA -  His LDL was previously normal but he is overdue and I will check a lipid profile along with routine blood work to include a CBC and be met today for his upcoming primary care appointment.    Current medicines are reviewed at length with the patient today.  The patient does not have concerns regarding medicines.  The following changes have been made:  None  Labs/ tests ordered today include:  Orders Placed This Encounter  Procedures   Lipid panel   Comprehensive Metabolic Panel (CMET)   CBC   EKG 12-Lead     Disposition:   FU with me in 12 months.    Signed, Minus Breeding, MD  06/08/2022 11:07 AM    [place]

## 2022-06-08 ENCOUNTER — Encounter: Payer: Self-pay | Admitting: Cardiology

## 2022-06-08 ENCOUNTER — Ambulatory Visit (INDEPENDENT_AMBULATORY_CARE_PROVIDER_SITE_OTHER): Payer: Medicare Other | Admitting: Cardiology

## 2022-06-08 VITALS — BP 124/76 | HR 77 | Ht 71.0 in | Wt 191.4 lb

## 2022-06-08 DIAGNOSIS — I10 Essential (primary) hypertension: Secondary | ICD-10-CM

## 2022-06-08 DIAGNOSIS — I2510 Atherosclerotic heart disease of native coronary artery without angina pectoris: Secondary | ICD-10-CM | POA: Diagnosis not present

## 2022-06-08 NOTE — Patient Instructions (Signed)
  Follow-Up: At [facility], you and your health needs are our priority.  As part of our continuing mission to provide you with exceptional heart care, we have created designated Provider Care Teams.  These Care Teams include your primary Cardiologist (physician) and Advanced Practice Providers (APPs -  Physician Assistants and Nurse Practitioners) who all work together to provide you with the care you need, when you need it.  We recommend signing up for the patient portal called "MyChart".  Sign up information is provided on this After Visit Summary.  MyChart is used to connect with patients for Virtual Visits (Telemedicine).  Patients are able to view lab/test results, encounter notes, upcoming appointments, etc.  Non-urgent messages can be sent to your provider as well.   To learn more about what you can do with MyChart, go to [email or web address].    Your next appointment:   12 month(s)  The format for your next appointment:   In Person  Provider:   Minus Breeding MD      Important Information About Sugar

## 2022-06-09 ENCOUNTER — Other Ambulatory Visit: Payer: Self-pay | Admitting: Cardiology

## 2022-06-09 LAB — CBC
Hematocrit: 45.7 % (ref 37.5–51.0)
Hemoglobin: 14.5 g/dL (ref 13.0–17.7)
MCH: 26.6 pg (ref 26.6–33.0)
MCHC: 31.7 g/dL (ref 31.5–35.7)
MCV: 84 fL (ref 79–97)
Platelets: 271 10*3/uL (ref 150–450)
RBC: 5.46 x10E6/uL (ref 4.14–5.80)
RDW: 14 % (ref 11.6–15.4)
WBC: 9.3 10*3/uL (ref 3.4–10.8)

## 2022-06-09 LAB — COMPREHENSIVE METABOLIC PANEL
ALT: 17 IU/L (ref 0–44)
AST: 20 IU/L (ref 0–40)
Albumin/Globulin Ratio: 1.3 (ref 1.2–2.2)
Albumin: 4.2 g/dL (ref 3.7–4.7)
Alkaline Phosphatase: 95 IU/L (ref 44–121)
BUN/Creatinine Ratio: 10 (ref 10–24)
BUN: 9 mg/dL (ref 8–27)
Bilirubin Total: 0.6 mg/dL (ref 0.0–1.2)
CO2: 25 mmol/L (ref 20–29)
Calcium: 9.3 mg/dL (ref 8.6–10.2)
Chloride: 104 mmol/L (ref 96–106)
Creatinine, Ser: 0.91 mg/dL (ref 0.76–1.27)
Globulin, Total: 3.2 g/dL (ref 1.5–4.5)
Glucose: 96 mg/dL (ref 70–99)
Potassium: 4.8 mmol/L (ref 3.5–5.2)
Sodium: 142 mmol/L (ref 134–144)
Total Protein: 7.4 g/dL (ref 6.0–8.5)
eGFR: 90 mL/min/{1.73_m2} (ref >59)

## 2022-06-09 LAB — LIPID PANEL
Chol/HDL Ratio: 2.4 ratio (ref 0.0–5.0)
Cholesterol, Total: 115 mg/dL (ref 100–199)
HDL: 47 mg/dL (ref >39)
LDL Chol Calc (NIH): 55 mg/dL (ref 0–99)
Triglycerides: 60 mg/dL (ref 0–149)
VLDL Cholesterol Cal: 13 mg/dL (ref 5–40)

## 2022-06-18 DIAGNOSIS — H2513 Age-related nuclear cataract, bilateral: Secondary | ICD-10-CM | POA: Diagnosis not present

## 2022-06-18 DIAGNOSIS — H524 Presbyopia: Secondary | ICD-10-CM | POA: Diagnosis not present

## 2022-06-19 ENCOUNTER — Encounter: Payer: Self-pay | Admitting: Internal Medicine

## 2022-06-19 ENCOUNTER — Ambulatory Visit (INDEPENDENT_AMBULATORY_CARE_PROVIDER_SITE_OTHER): Payer: Medicare Other | Admitting: Internal Medicine

## 2022-06-19 VITALS — BP 100/62 | HR 93 | Temp 97.8°F | Ht 71.0 in | Wt 190.0 lb

## 2022-06-19 DIAGNOSIS — E785 Hyperlipidemia, unspecified: Secondary | ICD-10-CM | POA: Diagnosis not present

## 2022-06-19 DIAGNOSIS — I10 Essential (primary) hypertension: Secondary | ICD-10-CM

## 2022-06-19 DIAGNOSIS — I2510 Atherosclerotic heart disease of native coronary artery without angina pectoris: Secondary | ICD-10-CM | POA: Diagnosis not present

## 2022-06-19 DIAGNOSIS — Z0000 Encounter for general adult medical examination without abnormal findings: Secondary | ICD-10-CM

## 2022-06-19 DIAGNOSIS — R1319 Other dysphagia: Secondary | ICD-10-CM | POA: Diagnosis not present

## 2022-06-19 DIAGNOSIS — N401 Enlarged prostate with lower urinary tract symptoms: Secondary | ICD-10-CM | POA: Diagnosis not present

## 2022-06-19 NOTE — Assessment & Plan Note (Signed)
Mild symptoms Okay without medications

## 2022-06-19 NOTE — Progress Notes (Signed)
Hearing Screening - Comments:: Passed whisper test Vision Screening - Comments:: June 18, 2022

## 2022-06-19 NOTE — Progress Notes (Signed)
Subjective:    Patient ID: Juan [name], male    DOB: 1951/01/19, 71 y.o.   MRN: [id number]  HPI Here for Medicare wellness visit and follow up of chronic health conditions Reviewed form and advanced directives Reviewed other doctors Does slight stretching---looking into starting with a personal trainer Still enjoys occ bourbon or beer No tobacco Vision is okay--just saw eye doctor Hearing is okay---wife feels he has mild problems No falls No true depression or anhedonia Independent with instrumental ADLs  Had a rough year Had incarcerated hernia that had to be repaired in 13-Oct-2023 last year Brother died --brain cancer Wife fell and broke her femur--had surgery. Needed a lot of help after this. Back to walking with a cane  Had terrible pain in left knee on Memorial day Went to Dr Norris--related pain mostly to hip problems (and may need THR) Got cortisone shot in left knee and the pain was better Does have a hard time bending down to pick things up Shoulder is better  No chest pain No SOB No dizziness or syncope No palpitations  No edema  Voids okay--nocturia x 2-3 Did go back to urologist--PSA was better (down to 3.1)  Still gets some dysphagia Things can still get stuck if he is not careful---if he eats too fast He sometimes has to throw things up Not as frequent now No regular heartburn  Current Outpatient Medications on File Prior to Visit  Medication Sig Dispense Refill   acetaminophen (TYLENOL) 325 MG tablet Take 650 mg by mouth every 6 (six) hours as needed for headache, mild pain or fever.     aspirin EC 81 MG tablet Take 81 mg by mouth daily.     atorvastatin (LIPITOR) 20 MG tablet TAKE 1 TABLET BY MOUTH EVERY DAY 90 tablet 3   Multiple Vitamins-Minerals (EMERGEN-C IMMUNE PLUS) PACK Take 1 packet by mouth in the morning.     ramipril (ALTACE) 2.5 MG capsule TAKE 1 CAPSULE BY MOUTH EVERY DAY 90 capsule 1   No current facility-administered medications on  file prior to visit.    No Known Allergies  Past Medical History:  Diagnosis Date   CAD (coronary artery disease)    a. RCA occlusion with 2 drug-eluting stents 2004;  b. 90% stenosis distal to the stents in the same artery treated with drug-eluting stenting February 2012;  .c 12/2013 Cath: nonobs dzs.   Complication of anesthesia    Diverticulosis of colon    GERD (gastroesophageal reflux disease)    Hyperlipidemia    Hypertension    Melanoma ([place])    Myocardial infarction ([place]) 2004   PONV (postoperative nausea and vomiting)    Age 2- not since    Past Surgical History:  Procedure Laterality Date   COLONOSCOPY     ESOPHAGOSCOPY N/A 05/31/2015   Procedure: ESOPHAGOSCOPY;  Surgeon: Izora Gala, MD;  Location: [place];  Service: ENT;  Laterality: N/A;   INGUINAL HERNIA REPAIR Right 09/12/2021   Procedure: HERNIA REPAIR INGUINAL ADULT;  Surgeon: Jesusita Oka, MD;  Location: [place];  Service: General;  Laterality: Right;   LEFT HEART CATHETERIZATION WITH CORONARY ANGIOGRAM N/A 12/14/2013   Procedure: LEFT HEART CATHETERIZATION WITH CORONARY ANGIOGRAM;  Surgeon: Sinclair Grooms, MD;  Location: [facility] CATH LAB;  Service: Cardiovascular;  Laterality: N/A;   MELANOMA EXCISION     3 seperate ones   PTCA  2004   wisdom teeth removal      Family History  Problem Relation  Age of Onset   Stroke Mother    Hypertension Mother    Heart attack Father 62       died   Hypertension Brother    Cancer Brother        prostate   Brain cancer Brother    Colon cancer Neg Hx    Pancreatic cancer Neg Hx    Stomach cancer Neg Hx    Esophageal cancer Neg Hx    Rectal cancer Neg Hx    Diabetes Neg Hx     Social History   Socioeconomic History   Marital status: Married    Spouse name: Not on file   Number of children: 1   Years of education: Not on file   Highest education level: Not on file  Occupational History   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  Tobacco Use   Smoking status: Never    Passive exposure: Never   Smokeless tobacco: Never  Vaping Use   Vaping Use: Never used  Substance and Sexual Activity   Alcohol use: Yes    Alcohol/week: 1.0 standard drink of alcohol    Types: 1 Cans of beer per week   Drug use: No   Sexual activity: Not on file  Other Topics Concern   Not on file  Social History Narrative   No living will or health care POA   Would want wife to make decisions--then daughter   Would accept resuscitation attempts   Would accept at least short term trial of tube feeds   Social Determinants of Health   Financial Resource Strain: Not on file  Food Insecurity: Not on file  Transportation Needs: Not on file  Physical Activity: Not on file  Stress: Not on file  Social Connections: Not on file  Intimate Partner Violence: Not on file   Review of Systems Appetite is good Weight is slightly down from last year Sleeps okay Wears seat belt Teeth are fine--keeps up with dentist Bowels are regular--no blood Gets full skin exam twice a year--no suspicious spots today     Objective:   Physical Exam Constitutional:      Appearance: Normal appearance.  HENT:     Mouth/Throat:     Comments: No lesions Eyes:     Conjunctiva/sclera: Conjunctivae normal.     Pupils: Pupils are equal, round, and reactive to light.  Cardiovascular:     Rate and Rhythm: Normal rate and regular rhythm.     Pulses: Normal pulses.     Heart sounds: No murmur heard.    No gallop.  Pulmonary:     Effort: Pulmonary effort is normal.     Breath sounds: Normal breath sounds. No wheezing or rales.  Abdominal:     Palpations: Abdomen is soft.     Tenderness: There is no abdominal tenderness.  Musculoskeletal:     Cervical back: Neck supple.     Right lower leg: No edema.     Left lower leg: No edema.  Lymphadenopathy:     Cervical: No cervical adenopathy.  Skin:    Findings: No lesion or rash.  Neurological:      General: No focal deficit present.     Mental Status: He is alert and oriented to person, place, and time.     Comments: Mini-cog normal  Psychiatric:        Mood and Affect: Mood normal.        Behavior: Behavior normal.  Assessment & Plan:

## 2022-06-19 NOTE — Assessment & Plan Note (Signed)
No angina Needs to work out more On ASA 81, atorvastatin 20, ramipril 2.'5mg'$  daily

## 2022-06-19 NOTE — Assessment & Plan Note (Signed)
No regular heartburn and now slightly better May want to add EGD to colonoscopy next year

## 2022-06-19 NOTE — Assessment & Plan Note (Signed)
I have personally reviewed the Medicare Annual Wellness questionnaire and have noted 1. The patient's medical and social history 2. Their use of alcohol, tobacco or illicit drugs 3. Their current medications and supplements 4. The patient's functional ability including ADL's, fall risks, home safety risks and hearing or visual             impairment. 5. Diet and physical activities 6. Evidence for depression or mood disorders  The patients weight, height, BMI and visual acuity have been recorded in the chart I have made referrals, counseling and provided education to the patient based review of the above and I have provided the pt with a written personalized care plan for preventive services.  I have provided you with a copy of your personalized plan for preventive services. Please take the time to review along with your updated medication list.  Due for colonoscopy next year Recommended no more PSA testing--went down this year and he is over 68 Needs to follow through with trainer and exercise Prefers no COVID or flu vaccines--recommended though Td/shingrix at pharmacy

## 2022-06-19 NOTE — Assessment & Plan Note (Signed)
Secondary prevention with atorvastatin 20

## 2022-06-19 NOTE — Assessment & Plan Note (Signed)
BP Readings from Last 3 Encounters:  06/19/22 100/62  06/08/22 124/76  09/13/21 121/71   Continue low dose ramipril due to CAD

## 2022-07-10 ENCOUNTER — Encounter: Payer: Self-pay | Admitting: Internal Medicine

## 2022-07-10 ENCOUNTER — Ambulatory Visit (INDEPENDENT_AMBULATORY_CARE_PROVIDER_SITE_OTHER): Payer: Medicare Other | Admitting: Internal Medicine

## 2022-07-10 VITALS — BP 100/60 | HR 93 | Temp 98.1°F | Ht 71.0 in | Wt 190.0 lb

## 2022-07-10 DIAGNOSIS — G44209 Tension-type headache, unspecified, not intractable: Secondary | ICD-10-CM

## 2022-07-10 DIAGNOSIS — R519 Headache, unspecified: Secondary | ICD-10-CM | POA: Insufficient documentation

## 2022-07-10 DIAGNOSIS — I2510 Atherosclerotic heart disease of native coronary artery without angina pectoris: Secondary | ICD-10-CM

## 2022-07-10 MED ORDER — AMOXICILLIN-POT CLAVULANATE 875-125 MG PO TABS: 1.0000 | ORAL_TABLET | Freq: Two times a day (BID) | ORAL | 0 refills | Status: DC

## 2022-07-10 NOTE — Assessment & Plan Note (Addendum)
Seems related to the neck stiffness at first No clear sinusitis but could be sleeping different with the tooth issue Ear fullness could be from the cerumen--will try to clear this out  TMs look okay after cerumen out  Pain seems mostly tension type Will extend antibiotic due to concern for tooth infection being related Will change to augmentin for another week Discussed being careful about sleeping position for head Can try heat wrap on neck also  He has had some tick bites--wonders about Lyme disease with these symptoms Discussed that this doesn't seem to tick related

## 2022-07-10 NOTE — Progress Notes (Signed)
Subjective:    Patient ID: Juan [name], male    DOB: 1951-11-15, 71 y.o.   MRN: [id number]  HPI Here due to neck and head aching  Has a "slight headache" that started last week with stiff neck Not severe but wondering what is going on Ears are stopped Mostly on right side---temporal Appetite is off for the past few days Feels "blah" No cough or SOB  Has infected tooth ---left lower molar (it broke and that is why he went) On amoxil and will need it extracted (finishing 2 week course)  Current Outpatient Medications on File Prior to Visit  Medication Sig Dispense Refill   acetaminophen (TYLENOL) 325 MG tablet Take 650 mg by mouth every 6 (six) hours as needed for headache, mild pain or fever.     amoxicillin (AMOXIL) 500 MG capsule Take 500 mg by mouth 3 (three) times daily.     aspirin EC 81 MG tablet Take 81 mg by mouth daily.     atorvastatin (LIPITOR) 20 MG tablet TAKE 1 TABLET BY MOUTH EVERY DAY 90 tablet 3   Multiple Vitamins-Minerals (EMERGEN-C IMMUNE PLUS) PACK Take 1 packet by mouth in the morning.     ramipril (ALTACE) 2.5 MG capsule TAKE 1 CAPSULE BY MOUTH EVERY DAY 90 capsule 1   No current facility-administered medications on file prior to visit.    No Known Allergies  Past Medical History:  Diagnosis Date   CAD (coronary artery disease)    a. RCA occlusion with 2 drug-eluting stents 2004;  b. 90% stenosis distal to the stents in the same artery treated with drug-eluting stenting February 2012;  .c 12/2013 Cath: nonobs dzs.   Complication of anesthesia    Diverticulosis of colon    GERD (gastroesophageal reflux disease)    Hyperlipidemia    Hypertension    Melanoma ([place])    Myocardial infarction ([place]) 2004   PONV (postoperative nausea and vomiting)    Age 61- not since    Past Surgical History:  Procedure Laterality Date   COLONOSCOPY     ESOPHAGOSCOPY N/A 05/31/2015   Procedure: ESOPHAGOSCOPY;  Surgeon: Izora Gala, MD;  Location: [place];  Service:  ENT;  Laterality: N/A;   INGUINAL HERNIA REPAIR Right 09/12/2021   Procedure: HERNIA REPAIR INGUINAL ADULT;  Surgeon: Jesusita Oka, MD;  Location: [place];  Service: General;  Laterality: Right;   LEFT HEART CATHETERIZATION WITH CORONARY ANGIOGRAM N/A 12/14/2013   Procedure: LEFT HEART CATHETERIZATION WITH CORONARY ANGIOGRAM;  Surgeon: Sinclair Grooms, MD;  Location: [facility] CATH LAB;  Service: Cardiovascular;  Laterality: N/A;   MELANOMA EXCISION     3 seperate ones   PTCA  2004   wisdom teeth removal      Family History  Problem Relation Age of Onset   Stroke Mother    Hypertension Mother    Heart attack Father 70       died   Hypertension Brother    Cancer Brother        prostate   Brain cancer Brother    Colon cancer Neg Hx    Pancreatic cancer Neg Hx    Stomach cancer Neg Hx    Esophageal cancer Neg Hx    Rectal cancer Neg Hx    Diabetes Neg Hx     Social History   Socioeconomic History   Marital status: Married    Spouse name: Not on file   Number of children: 1   Years of  education: Not on file   Highest education level: Not on file  Occupational History   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  Tobacco Use   Smoking status: Never    Passive exposure: Never   Smokeless tobacco: Never  Vaping Use   Vaping Use: Never used  Substance and Sexual Activity   Alcohol use: Yes    Alcohol/week: 1.0 standard drink of alcohol    Types: 1 Cans of beer per week   Drug use: No   Sexual activity: Not on file  Other Topics Concern   Not on file  Social History Narrative   No living will or health care POA   Would want wife to make decisions--then daughter   Would accept resuscitation attempts   Would accept at least short term trial of tube feeds   Social Determinants of Health   Financial Resource Strain: Not on file  Food Insecurity: Not on file  Transportation Needs: Not on file  Physical Activity: Not on file  Stress:  Not on file  Social Connections: Not on file  Intimate Partner Violence: Not on file   Review of Systems No fever No sig weight loss No N/V No balance issues Has allergies but no chronic sinus symptoms Not sleeping well in the past few nights--and thirsty more    Objective:   Physical Exam Constitutional:      Appearance: He is well-developed.  HENT:     Ears:     Comments: Canals full of cerumen    Mouth/Throat:     Pharynx: No oropharyngeal exudate or posterior oropharyngeal erythema.     Comments: Broken left lower molar--no clear inflammation No sinus tenderness Pulmonary:     Effort: Pulmonary effort is normal.     Breath sounds: Normal breath sounds. No wheezing or rales.  Musculoskeletal:     Cervical back: Neck supple. No rigidity or tenderness.  Lymphadenopathy:     Cervical: No cervical adenopathy.  Neurological:     Mental Status: He is alert.            Assessment & Plan:

## 2022-08-15 DIAGNOSIS — Z85820 Personal history of malignant melanoma of skin: Secondary | ICD-10-CM | POA: Diagnosis not present

## 2022-08-15 DIAGNOSIS — L812 Freckles: Secondary | ICD-10-CM | POA: Diagnosis not present

## 2022-08-15 DIAGNOSIS — D0462 Carcinoma in situ of skin of left upper limb, including shoulder: Secondary | ICD-10-CM | POA: Diagnosis not present

## 2022-08-15 DIAGNOSIS — L111 Transient acantholytic dermatosis [Grover]: Secondary | ICD-10-CM | POA: Diagnosis not present

## 2022-08-15 DIAGNOSIS — L603 Nail dystrophy: Secondary | ICD-10-CM | POA: Diagnosis not present

## 2022-08-15 DIAGNOSIS — C44722 Squamous cell carcinoma of skin of right lower limb, including hip: Secondary | ICD-10-CM | POA: Diagnosis not present

## 2022-08-15 DIAGNOSIS — L570 Actinic keratosis: Secondary | ICD-10-CM | POA: Diagnosis not present

## 2022-08-15 DIAGNOSIS — C44622 Squamous cell carcinoma of skin of right upper limb, including shoulder: Secondary | ICD-10-CM | POA: Diagnosis not present

## 2022-08-15 DIAGNOSIS — Z85828 Personal history of other malignant neoplasm of skin: Secondary | ICD-10-CM | POA: Diagnosis not present

## 2022-08-15 DIAGNOSIS — L820 Inflamed seborrheic keratosis: Secondary | ICD-10-CM | POA: Diagnosis not present

## 2022-08-15 DIAGNOSIS — D485 Neoplasm of uncertain behavior of skin: Secondary | ICD-10-CM | POA: Diagnosis not present

## 2022-08-15 DIAGNOSIS — L821 Other seborrheic keratosis: Secondary | ICD-10-CM | POA: Diagnosis not present

## 2022-09-07 DIAGNOSIS — M25552 Pain in left hip: Secondary | ICD-10-CM | POA: Diagnosis not present

## 2022-09-07 DIAGNOSIS — M25551 Pain in right hip: Secondary | ICD-10-CM | POA: Diagnosis not present

## 2022-09-13 DIAGNOSIS — M25551 Pain in right hip: Secondary | ICD-10-CM | POA: Diagnosis not present

## 2022-11-01 DIAGNOSIS — M1611 Unilateral primary osteoarthritis, right hip: Secondary | ICD-10-CM | POA: Diagnosis not present

## 2022-11-01 DIAGNOSIS — M25551 Pain in right hip: Secondary | ICD-10-CM | POA: Diagnosis not present

## 2022-11-14 ENCOUNTER — Telehealth: Payer: Self-pay

## 2022-11-14 NOTE — Telephone Encounter (Signed)
   Pre-operative Risk Assessment    Patient Name: Juan [name]  DOB: 1951/06/22 MRN: [id number]      Request for Surgical Clearance    Procedure:   RT TOTAL HIP ARTHROPLASTY  Date of Surgery:  Clearance 01/29/23                                 Surgeon:  Pilar Plate ALUISIO/KELLY [facility] Surgeon's Group or Practice Name:  [facility]  Phone number:  [phone] Fax number:  [phone]    Type of Clearance Requested:   - Medical    Type of Anesthesia:   CHOICE   Additional requests/questions:    [name]   11/14/2022, 2:33 PM

## 2022-11-14 NOTE — Telephone Encounter (Signed)
   Name: Juan [name]  DOB: [birth year]  MRN: [id number]  Primary Cardiologist: None  Chart reviewed as part of pre-operative protocol coverage. Because of BROUGHTON EPPINGER past medical history and time since last visit, he will require a follow-up telephone visit in order to better assess preoperative cardiovascular risk.  Pre-op covering staff: - Please schedule appointment and call patient to inform them. If patient already had an upcoming appointment within acceptable timeframe, please add "pre-op clearance" to the appointment notes so provider is aware. - Please contact requesting surgeon's office via preferred method (i.e, phone, fax) to inform them of need for appointment prior to surgery.  No medications indicated as needing held.  Elgie Collard, PA-C  11/14/2022, 4:08 PM

## 2022-11-15 ENCOUNTER — Telehealth: Payer: Self-pay

## 2022-11-15 NOTE — Telephone Encounter (Signed)
  Patient Consent for Virtual Visit         Juan [name] has provided verbal consent on 11/15/2022 for a virtual visit (video or telephone).   CONSENT FOR VIRTUAL VISIT FOR:  Juan [name]  By participating in this virtual visit I agree to the following:  I hereby voluntarily request, consent and authorize [facility] and its employed or contracted physicians, physician assistants, nurse practitioners or other licensed health care professionals (the Practitioner), to provide me with telemedicine health care services (the "Services") as deemed necessary by the treating Practitioner. I acknowledge and consent to receive the Services by the Practitioner via telemedicine. I understand that the telemedicine visit will involve communicating with the Practitioner through live audiovisual communication technology and the disclosure of certain medical information by electronic transmission. I acknowledge that I have been given the opportunity to request an in-person assessment or other available alternative prior to the telemedicine visit and am voluntarily participating in the telemedicine visit.  I understand that I have the right to withhold or withdraw my consent to the use of telemedicine in the course of my care at any time, without affecting my right to future care or treatment, and that the Practitioner or I may terminate the telemedicine visit at any time. I understand that I have the right to inspect all information obtained and/or recorded in the course of the telemedicine visit and may receive copies of available information for a reasonable fee.  I understand that some of the potential risks of receiving the Services via telemedicine include:  Delay or interruption in medical evaluation due to technological equipment failure or disruption; Information transmitted may not be sufficient (e.g. poor resolution of images) to allow for appropriate medical decision making by the Practitioner;  and/or  In rare instances, security protocols could fail, causing a breach of personal health information.  Furthermore, I acknowledge that it is my responsibility to provide information about my medical history, conditions and care that is complete and accurate to the best of my ability. I acknowledge that Practitioner's advice, recommendations, and/or decision may be based on factors not within their control, such as incomplete or inaccurate data provided by me or distortions of diagnostic images or specimens that may result from electronic transmissions. I understand that the practice of medicine is not an exact science and that Practitioner makes no warranties or guarantees regarding treatment outcomes. I acknowledge that a copy of this consent can be made available to me via my patient portal ([facility]), or I can request a printed copy by calling the office of [facility].    I understand that my insurance will be billed for this visit.   I have read or had this consent read to me. I understand the contents of this consent, which adequately explains the benefits and risks of the Services being provided via telemedicine.  I have been provided ample opportunity to ask questions regarding this consent and the Services and have had my questions answered to my satisfaction. I give my informed consent for the services to be provided through the use of telemedicine in my medical care

## 2022-11-15 NOTE — Telephone Encounter (Addendum)
Pt scheduled for a tele visit on 12/25/22 for preop clearance, Med rec and consent done

## 2022-11-15 NOTE — Telephone Encounter (Signed)
  Patient Consent for Virtual Visit         GAL FELDHAUS has provided verbal consent on 11/15/2022 for a virtual visit (video or telephone).   CONSENT FOR VIRTUAL VISIT FOR:  Juan [name]  By participating in this virtual visit I agree to the following:  I hereby voluntarily request, consent and authorize [facility] and its employed or contracted physicians, physician assistants, nurse practitioners or other licensed health care professionals (the Practitioner), to provide me with telemedicine health care services (the "Services") as deemed necessary by the treating Practitioner. I acknowledge and consent to receive the Services by the Practitioner via telemedicine. I understand that the telemedicine visit will involve communicating with the Practitioner through live audiovisual communication technology and the disclosure of certain medical information by electronic transmission. I acknowledge that I have been given the opportunity to request an in-person assessment or other available alternative prior to the telemedicine visit and am voluntarily participating in the telemedicine visit.  I understand that I have the right to withhold or withdraw my consent to the use of telemedicine in the course of my care at any time, without affecting my right to future care or treatment, and that the Practitioner or I may terminate the telemedicine visit at any time. I understand that I have the right to inspect all information obtained and/or recorded in the course of the telemedicine visit and may receive copies of available information for a reasonable fee.  I understand that some of the potential risks of receiving the Services via telemedicine include:  Delay or interruption in medical evaluation due to technological equipment failure or disruption; Information transmitted may not be sufficient (e.g. poor resolution of images) to allow for appropriate medical decision making by the Practitioner;  and/or  In rare instances, security protocols could fail, causing a breach of personal health information.  Furthermore, I acknowledge that it is my responsibility to provide information about my medical history, conditions and care that is complete and accurate to the best of my ability. I acknowledge that Practitioner's advice, recommendations, and/or decision may be based on factors not within their control, such as incomplete or inaccurate data provided by me or distortions of diagnostic images or specimens that may result from electronic transmissions. I understand that the practice of medicine is not an exact science and that Practitioner makes no warranties or guarantees regarding treatment outcomes. I acknowledge that a copy of this consent can be made available to me via my patient portal ([facility]), or I can request a printed copy by calling the office of [facility].    I understand that my insurance will be billed for this visit.   I have read or had this consent read to me. I understand the contents of this consent, which adequately explains the benefits and risks of the Services being provided via telemedicine.  I have been provided ample opportunity to ask questions regarding this consent and the Services and have had my questions answered to my satisfaction. I give my informed consent for the services to be provided through the use of telemedicine in my medical care

## 2022-11-17 ENCOUNTER — Other Ambulatory Visit: Payer: Self-pay | Admitting: Cardiology

## 2022-11-29 DIAGNOSIS — M25551 Pain in right hip: Secondary | ICD-10-CM | POA: Diagnosis not present

## 2022-11-29 DIAGNOSIS — M1611 Unilateral primary osteoarthritis, right hip: Secondary | ICD-10-CM | POA: Diagnosis not present

## 2022-12-25 ENCOUNTER — Encounter: Payer: Self-pay | Admitting: Nurse Practitioner

## 2022-12-25 ENCOUNTER — Telehealth: Payer: Self-pay | Admitting: Internal Medicine

## 2022-12-25 ENCOUNTER — Ambulatory Visit: Payer: Medicare Other | Attending: Cardiology | Admitting: Nurse Practitioner

## 2022-12-25 DIAGNOSIS — Z01810 Encounter for preprocedural cardiovascular examination: Secondary | ICD-10-CM

## 2022-12-25 NOTE — Telephone Encounter (Signed)
July 2023 CPE note and Labs routed to [employer]

## 2022-12-25 NOTE — Telephone Encounter (Signed)
Seth Bake from [facility] called over and stated they are needing Desa last office visit notes and labs sent over to them. They can be faxed over to [phone]. Thank you!

## 2022-12-25 NOTE — Progress Notes (Signed)
Virtual Visit via Telephone Note   Because of Juan [name] co-morbid illnesses, he is at least at moderate risk for complications without adequate follow up.  This format is felt to be most appropriate for this patient at this time.  The patient did not have access to video technology/had technical difficulties with video requiring transitioning to audio format only (telephone).  All issues noted in this document were discussed and addressed.  No physical exam could be performed with this format.  Please refer to the patient's chart for his consent to telehealth for [facility].  Evaluation Performed:  Preoperative cardiovascular risk assessment _____________   Date:  12/25/2022   Patient ID:  Juan [name], DOB [birth year], MRN [id number] Patient Location:  Home Provider location:   Office  Primary Care Provider:  Venia Carbon, MD Primary Cardiologist:  None  Chief Complaint / Patient Profile   72 y.o. y/o male with a h/o CAD s/p DES to-RCA in 2004 DES-d RCA in 2012, hypertension and hyperlipidemia who is pending right total hip arthroplasty on 01/01/2023 with Dr. Gaynelle Arabian of [facility] and presents today for telephonic preoperative cardiovascular risk assessment.  History of Present Illness    Juan [name] is a 72 y.o. male who presents via audio/video conferencing for a telehealth visit today.  Pt was last seen in cardiology clinic on 06/08/2022 by Dr. Percival Spanish.  At that time DYRELL TUCCILLO was doing well.  The patient is now pending procedure as outlined above. Since his last visit, he has done well from a cardiac standpoint.  He exercises at the [facility] regularly with a personal trainer.  He denies chest pain, palpitations, dyspnea, pnd, orthopnea, n, v, dizziness, syncope, edema, weight gain, or early satiety. All other systems reviewed and are otherwise negative except as noted above.   Past Medical History    Past Medical History:  Diagnosis Date   CAD  (coronary artery disease)    a. RCA occlusion with 2 drug-eluting stents 2004;  b. 90% stenosis distal to the stents in the same artery treated with drug-eluting stenting February 2012;  .c 12/2013 Cath: nonobs dzs.   Complication of anesthesia    Diverticulosis of colon    GERD (gastroesophageal reflux disease)    Hyperlipidemia    Hypertension    Melanoma ([place])    Myocardial infarction ([place]) 2004   PONV (postoperative nausea and vomiting)    Age 60- not since   Past Surgical History:  Procedure Laterality Date   COLONOSCOPY     ESOPHAGOSCOPY N/A 05/31/2015   Procedure: ESOPHAGOSCOPY;  Surgeon: Izora Gala, MD;  Location: [facility];  Service: ENT;  Laterality: N/A;   INGUINAL HERNIA REPAIR Right 09/12/2021   Procedure: HERNIA REPAIR INGUINAL ADULT;  Surgeon: Jesusita Oka, MD;  Location: [facility];  Service: General;  Laterality: Right;   LEFT HEART CATHETERIZATION WITH CORONARY ANGIOGRAM N/A 12/14/2013   Procedure: LEFT HEART CATHETERIZATION WITH CORONARY ANGIOGRAM;  Surgeon: Sinclair Grooms, MD;  Location: [facility] CATH LAB;  Service: Cardiovascular;  Laterality: N/A;   MELANOMA EXCISION     3 seperate ones   PTCA  2004   wisdom teeth removal      Allergies  No Known Allergies  Home Medications    Prior to Admission medications   Medication Sig Start Date End Date Taking? Authorizing Provider  acetaminophen (TYLENOL) 325 MG tablet Take 650 mg by mouth every 6 (six) hours as needed for headache, mild  pain or fever.    [provider]  aspirin EC 81 MG tablet Take 81 mg by mouth daily.    [provider]  atorvastatin (LIPITOR) 20 MG tablet TAKE 1 TABLET BY MOUTH EVERY DAY 06/11/22   Minus Breeding, MD  meloxicam (MOBIC) 15 MG tablet Take 15 mg by mouth daily. 11/01/22   [provider]  Multiple Vitamins-Minerals (EMERGEN-C IMMUNE PLUS) PACK Take 1 packet by mouth in the morning.    [provider]  ramipril (ALTACE) 2.5 MG capsule TAKE 1 CAPSULE BY  MOUTH EVERY DAY 11/19/22   Minus Breeding, MD    Physical Exam    Vital Signs:  JAMONI HEWES does not have vital signs available for review today.  Given telephonic nature of communication, physical exam is limited. AAOx3. NAD. Normal affect.  Speech and respirations are unlabored.  Accessory Clinical Findings    None  Assessment & Plan    1.  Preoperative Cardiovascular Risk Assessment:  According to the Revised Cardiac Risk Index (RCRI), his Perioperative Risk of Major Cardiac Event is (%): 0.9. His Functional Capacity in METs is: 7.99 according to the Duke Activity Status Index (DASI).Therefore, based on ACC/AHA guidelines, patient would be at acceptable risk for the planned procedure without further cardiovascular testing.  The patient was advised that if he develops new symptoms prior to surgery to contact our office to arrange for a follow-up visit, and he verbalized understanding.  Regarding ASA therapy, we recommend continuation of ASA throughout the perioperative period.  However, if the surgeon feels that cessation of ASA is required in the perioperative period, it may be stopped 5-7 days prior to surgery with a plan to resume it as soon as felt to be feasible from a surgical standpoint in the post-operative period.  A copy of this note will be routed to requesting surgeon.  Time:   Today, I have spent 5 minutes with the patient with telehealth technology discussing medical history, symptoms, and management plan.     Lenna Sciara, NP  12/25/2022, 9:09 AM

## 2022-12-27 DIAGNOSIS — Z0189 Encounter for other specified special examinations: Secondary | ICD-10-CM | POA: Diagnosis not present

## 2022-12-31 HISTORY — PX: TOTAL HIP ARTHROPLASTY: SHX124

## 2023-01-01 DIAGNOSIS — M1611 Unilateral primary osteoarthritis, right hip: Secondary | ICD-10-CM | POA: Diagnosis not present

## 2023-02-05 DIAGNOSIS — Z5189 Encounter for other specified aftercare: Secondary | ICD-10-CM | POA: Diagnosis not present

## 2023-02-25 DIAGNOSIS — L603 Nail dystrophy: Secondary | ICD-10-CM | POA: Diagnosis not present

## 2023-02-25 DIAGNOSIS — Z85828 Personal history of other malignant neoplasm of skin: Secondary | ICD-10-CM | POA: Diagnosis not present

## 2023-02-25 DIAGNOSIS — C44622 Squamous cell carcinoma of skin of right upper limb, including shoulder: Secondary | ICD-10-CM | POA: Diagnosis not present

## 2023-02-25 DIAGNOSIS — L111 Transient acantholytic dermatosis [Grover]: Secondary | ICD-10-CM | POA: Diagnosis not present

## 2023-02-25 DIAGNOSIS — Z85820 Personal history of malignant melanoma of skin: Secondary | ICD-10-CM | POA: Diagnosis not present

## 2023-02-25 DIAGNOSIS — L821 Other seborrheic keratosis: Secondary | ICD-10-CM | POA: Diagnosis not present

## 2023-02-25 DIAGNOSIS — L570 Actinic keratosis: Secondary | ICD-10-CM | POA: Diagnosis not present

## 2023-02-25 DIAGNOSIS — D485 Neoplasm of uncertain behavior of skin: Secondary | ICD-10-CM | POA: Diagnosis not present

## 2023-02-25 DIAGNOSIS — L218 Other seborrheic dermatitis: Secondary | ICD-10-CM | POA: Diagnosis not present

## 2023-05-07 DIAGNOSIS — N401 Enlarged prostate with lower urinary tract symptoms: Secondary | ICD-10-CM | POA: Diagnosis not present

## 2023-05-07 DIAGNOSIS — Z125 Encounter for screening for malignant neoplasm of prostate: Secondary | ICD-10-CM | POA: Diagnosis not present

## 2023-05-16 DIAGNOSIS — M5451 Vertebrogenic low back pain: Secondary | ICD-10-CM | POA: Diagnosis not present

## 2023-06-10 DIAGNOSIS — H2513 Age-related nuclear cataract, bilateral: Secondary | ICD-10-CM | POA: Diagnosis not present

## 2023-06-10 DIAGNOSIS — H524 Presbyopia: Secondary | ICD-10-CM | POA: Diagnosis not present

## 2023-06-18 ENCOUNTER — Other Ambulatory Visit: Payer: Self-pay | Admitting: Cardiology

## 2023-06-21 ENCOUNTER — Encounter: Payer: Self-pay | Admitting: Internal Medicine

## 2023-06-21 ENCOUNTER — Ambulatory Visit (INDEPENDENT_AMBULATORY_CARE_PROVIDER_SITE_OTHER): Payer: Medicare Other | Admitting: Internal Medicine

## 2023-06-21 VITALS — BP 118/76 | HR 72 | Temp 97.6°F | Ht 70.5 in | Wt 182.0 lb

## 2023-06-21 DIAGNOSIS — R1319 Other dysphagia: Secondary | ICD-10-CM

## 2023-06-21 DIAGNOSIS — N401 Enlarged prostate with lower urinary tract symptoms: Secondary | ICD-10-CM | POA: Diagnosis not present

## 2023-06-21 DIAGNOSIS — Z0000 Encounter for general adult medical examination without abnormal findings: Secondary | ICD-10-CM

## 2023-06-21 DIAGNOSIS — I2510 Atherosclerotic heart disease of native coronary artery without angina pectoris: Secondary | ICD-10-CM | POA: Diagnosis not present

## 2023-06-21 DIAGNOSIS — I10 Essential (primary) hypertension: Secondary | ICD-10-CM | POA: Diagnosis not present

## 2023-06-21 LAB — CBC
HCT: 44 % (ref 38.5–50.0)
Hemoglobin: 13.9 g/dL (ref 13.2–17.1)
MCH: 26.4 pg — ABNORMAL LOW (ref 27.0–33.0)
MCHC: 31.6 g/dL — ABNORMAL LOW (ref 32.0–36.0)
MPV: 11.6 fL (ref 7.5–12.5)
RBC: 5.27 10*6/uL (ref 4.20–5.80)
WBC: 8.9 10*3/uL (ref 3.8–10.8)

## 2023-06-21 NOTE — Assessment & Plan Note (Signed)
I have personally reviewed the Medicare Annual Wellness questionnaire and have noted 1. The patient's medical and social history 2. Their use of alcohol, tobacco or illicit drugs 3. Their current medications and supplements 4. The patient's functional ability including ADL's, fall risks, home safety risks and hearing or visual             impairment. 5. Diet and physical activities 6. Evidence for depression or mood disorders  The patients weight, height, BMI and visual acuity have been recorded in the chart I have made referrals, counseling and provided education to the patient based review of the above and I have provided the pt with a written personalized care plan for preventive services.  I have provided you with a copy of your personalized plan for preventive services. Please take the time to review along with your updated medication list.  Colon due later this year Should be done with PSA testing due to age Exercising regularly Td at the pharmacy Prefers no COVID, flu, RSV--recommended though Consider shingrix

## 2023-06-21 NOTE — Assessment & Plan Note (Signed)
Has improved Doesn't need meds now

## 2023-06-21 NOTE — Assessment & Plan Note (Signed)
No symptoms on ASA, statin, ramipril

## 2023-06-21 NOTE — Assessment & Plan Note (Signed)
BP Readings from Last 3 Encounters:  06/21/23 118/76  07/10/22 100/60  06/19/22 100/62   Good control with ramipril 2.5mg  daily

## 2023-06-21 NOTE — Progress Notes (Addendum)
Subjective:    Patient ID: Juan [name], male    DOB: 02-Feb-1951, 72 y.o.   MRN: [id number]  HPI Here for Medicare wellness visit and follow up of chronic health conditions Reviewed advanced directives Reviewed other doctors---Dr Evans--urology, Dr Weldon Inches, Dr Tim Lair, Dr Princella Ion, [facility], Dr Hochriein--cardiology Had right THR in January. No other surgery Vision is okay Hearing is fine Occ beers No tobacco Regular exercise No falls No depression or anhedonia Independent with instrumental ADLs No memory problems  Doing okay Has lost weight--working with trainer Working on healthy diet  [facility] from [facility] Now seeing back specialist --spur on end of spine No surgery Not playing golf as much--hard to even bend down to tee up ball  Voids okay Slow stream/dribbling --empties okay Prefers no medication Last PSA 3.79  No recent dysphagia recently Can get choked if he eats too fast---like bread (but not recently) No heartburn  No chest pain or SOB No dizziness or syncope No palpitations No edema No headache  Current Outpatient Medications on File Prior to Visit  Medication Sig Dispense Refill   acetaminophen (TYLENOL) 325 MG tablet Take 650 mg by mouth every 6 (six) hours as needed for headache, mild pain or fever.     aspirin EC 81 MG tablet Take 81 mg by mouth daily.     atorvastatin (LIPITOR) 20 MG tablet TAKE 1 TABLET BY MOUTH EVERY DAY 30 tablet 0   ramipril (ALTACE) 2.5 MG capsule TAKE 1 CAPSULE BY MOUTH EVERY DAY 90 capsule 3   No current facility-administered medications on file prior to visit.    No Known Allergies  Past Medical History:  Diagnosis Date   CAD (coronary artery disease)    a. RCA occlusion with 2 drug-eluting stents 2004;  b. 90% stenosis distal to the stents in the same artery treated with drug-eluting stenting February 2012;  .c 12/2013 Cath: nonobs dzs.   Complication of anesthesia    Diverticulosis of  colon    GERD (gastroesophageal reflux disease)    Hyperlipidemia    Hypertension    Melanoma ([facility])    Myocardial infarction ([facility]) 2004   PONV (postoperative nausea and vomiting)    Age 68- not since    Past Surgical History:  Procedure Laterality Date   COLONOSCOPY     ESOPHAGOSCOPY N/A 05/31/2015   Procedure: ESOPHAGOSCOPY;  Surgeon: Serena Colonel, MD;  [facility] OR;  Service: ENT;  Laterality: N/A;   INGUINAL HERNIA REPAIR Right 09/12/2021   Procedure: HERNIA REPAIR INGUINAL ADULT;  Surgeon: Diamantina Monks, MD;  Location: [facility] OR;  Service: General;  Laterality: Right;   LEFT HEART CATHETERIZATION WITH CORONARY ANGIOGRAM N/A 12/14/2013   Procedure: LEFT HEART CATHETERIZATION WITH CORONARY ANGIOGRAM;  Surgeon: Lesleigh Noe, MD;  Location: [facility] CATH LAB;  Service: Cardiovascular;  Laterality: N/A;   MELANOMA EXCISION     3 seperate ones   PTCA  12/03/2002   TOTAL HIP ARTHROPLASTY Right 12/31/2022   wisdom teeth removal      Family History  Problem Relation Age of Onset   Stroke Mother    Hypertension Mother    Heart attack Father 61       died   Hypertension Brother    Cancer Brother        prostate   Brain cancer Brother    Colon cancer Neg Hx    Pancreatic cancer Neg Hx    Stomach cancer Neg Hx    Esophageal cancer  Neg Hx    Rectal cancer Neg Hx    Diabetes Neg Hx     Social History   Socioeconomic History   Marital status: Married    Spouse name: Not on file   Number of children: 1   Years of education: Not on file   Highest education level: Not on file  Occupational History   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  Tobacco Use   Smoking status: Never    Passive exposure: Never   Smokeless tobacco: Never  Vaping Use   Vaping status: Never Used  Substance and Sexual Activity   Alcohol use: Yes    Alcohol/week: 1.0 standard drink of alcohol    Types: 1 Cans of beer per week   Drug use: No   Sexual activity:  Not on file  Other Topics Concern   Not on file  Social History Narrative   No living will or health care POA   Would want wife to make decisions--then daughter   Would accept resuscitation attempts   Would accept at least short term trial of tube feeds   Social Determinants of Health   Financial Resource Strain: Not on file  Food Insecurity: Not on file  Transportation Needs: Not on file  Physical Activity: Not on file  Stress: Not on file  Social Connections: Not on file  Intimate Partner Violence: Not on file   Review of Systems Appetite is fine Sleeps fairly well--some nocturia (1-3) Wears seat belt Teeth are okay---recent root canal/crown after implant Going to derm next week---did use efudex recently Bowels move okay--no blood. Rare blood on paper if constipated    Objective:   Physical Exam Constitutional:      Appearance: Normal appearance.  HENT:     Mouth/Throat:     Pharynx: No oropharyngeal exudate or posterior oropharyngeal erythema.  Eyes:     Conjunctiva/sclera: Conjunctivae normal.     Pupils: Pupils are equal, round, and reactive to light.  Cardiovascular:     Rate and Rhythm: Normal rate and regular rhythm.     Pulses: Normal pulses.     Heart sounds: No murmur heard.    No gallop.  Pulmonary:     Effort: Pulmonary effort is normal.     Breath sounds: Normal breath sounds. No wheezing or rales.  Abdominal:     Palpations: Abdomen is soft.     Tenderness: There is no abdominal tenderness.  Musculoskeletal:     Cervical back: Neck supple.     Right lower leg: No edema.     Left lower leg: No edema.  Lymphadenopathy:     Cervical: No cervical adenopathy.  Skin:    Findings: No lesion or rash.  Neurological:     General: No focal deficit present.     Mental Status: He is alert and oriented to person, place, and time.     Comments: Word naming---13/1 minute Recall 2/3  Psychiatric:        Mood and Affect: Mood normal.        Behavior:  Behavior normal.            Assessment & Plan:

## 2023-06-21 NOTE — Progress Notes (Signed)
Hearing Screening - Comments:: Passed whisper test Vision Screening - Comments:: July 2024

## 2023-06-21 NOTE — Assessment & Plan Note (Signed)
Back now mostly Can't bend down Seeing ortho--meloxicam

## 2023-06-21 NOTE — Addendum Note (Signed)
Addended by: Alvina Chou on: 06/21/2023 11:31 AM   Modules accepted: Orders

## 2023-06-21 NOTE — Assessment & Plan Note (Signed)
Ongoing symptoms but not ready for meds Will start tamsulosin if worsens

## 2023-06-22 LAB — COMPREHENSIVE METABOLIC PANEL
AG Ratio: 1.4 (calc) (ref 1.0–2.5)
ALT: 11 U/L (ref 9–46)
AST: 14 U/L (ref 10–35)
Albumin: 3.9 g/dL (ref 3.6–5.1)
Alkaline phosphatase (APISO): 83 U/L (ref 35–144)
BUN: 8 mg/dL (ref 7–25)
CO2: 30 mmol/L (ref 20–32)
Calcium: 9.3 mg/dL (ref 8.6–10.3)
Chloride: 103 mmol/L (ref 98–110)
Creat: 0.87 mg/dL (ref 0.70–1.28)
Globulin: 2.8 g/dL (calc) (ref 1.9–3.7)
Glucose, Bld: 97 mg/dL (ref 65–99)
Potassium: 4.8 mmol/L (ref 3.5–5.3)
Sodium: 140 mmol/L (ref 135–146)
Total Bilirubin: 0.7 mg/dL (ref 0.2–1.2)
Total Protein: 6.7 g/dL (ref 6.1–8.1)

## 2023-06-22 LAB — LIPID PANEL
Cholesterol: 111 mg/dL (ref <200)
HDL: 46 mg/dL (ref >=40)
LDL Cholesterol (Calc): 48 mg/dL (calc)
Non-HDL Cholesterol (Calc): 65 mg/dL (calc) (ref <130)
Total CHOL/HDL Ratio: 2.4 (calc) (ref <5.0)
Triglycerides: 87 mg/dL (ref <150)

## 2023-06-22 LAB — CBC
MCV: 83.5 fL (ref 80.0–100.0)
Platelets: 299 10*3/uL (ref 140–400)
RDW: 13.7 % (ref 11.0–15.0)

## 2023-06-27 DIAGNOSIS — M5387 Other specified dorsopathies, lumbosacral region: Secondary | ICD-10-CM | POA: Diagnosis not present

## 2023-06-27 DIAGNOSIS — M9901 Segmental and somatic dysfunction of cervical region: Secondary | ICD-10-CM | POA: Diagnosis not present

## 2023-06-27 DIAGNOSIS — M5442 Lumbago with sciatica, left side: Secondary | ICD-10-CM | POA: Diagnosis not present

## 2023-06-27 DIAGNOSIS — M9902 Segmental and somatic dysfunction of thoracic region: Secondary | ICD-10-CM | POA: Diagnosis not present

## 2023-07-01 DIAGNOSIS — Z85828 Personal history of other malignant neoplasm of skin: Secondary | ICD-10-CM | POA: Diagnosis not present

## 2023-07-01 DIAGNOSIS — Z85820 Personal history of malignant melanoma of skin: Secondary | ICD-10-CM | POA: Diagnosis not present

## 2023-07-01 DIAGNOSIS — L821 Other seborrheic keratosis: Secondary | ICD-10-CM | POA: Diagnosis not present

## 2023-07-03 NOTE — Progress Notes (Unsigned)
  Cardiology Office Note:   Date:  07/04/2023  ID:  Juan [name], Juan [name] 1951-07-29, MRN [id number] PCP: Karie Schwalbe, MD  [facility] Providers Cardiologist:  None {  History of Present Illness:   Juan [name] is a 72 y.o. male  who presents for follow up of CAD.   In 2004 he had 2 drug-eluting stents to an occluded RCA.  He had 90% stenosis distal to the stents treated with drug-eluting stent in February 2012.  Last catheter 2015 demonstrated nonobstructive disease. He had some vague symptoms when I saw him and he was to have a POET ([place] Exercise Treadmill).  However, he did not have this.  Since I last saw him he had THR.    He has done well. The patient denies any new symptoms such as chest discomfort, neck or arm discomfort. There has been no new shortness of breath, PND or orthopnea. There have been no reported palpitations, presyncope or syncope.  Still goes to the [facility] with a Systems analyst.    ROS: As stated in the HPI and negative for all other systems.  Studies Reviewed:    EKG:   EKG Interpretation Date/Time:  Thursday July 04 2023 10:16:25 EDT Ventricular Rate:  83 PR Interval:  198 QRS Duration:  80 QT Interval:  358 QTC Calculation: 420 R Axis:   30  Text Interpretation: Normal sinus rhythm Poor anterior R wave progression When compared with ECG of 14-Dec-2013 10:26, No significant change was found Confirmed by Rollene Rotunda ([id number]) on 07/04/2023 10:47:50 AM      Risk Assessment/Calculations:              Physical Exam:   VS:  BP 108/64 (BP Location: Left Arm, Patient Position: Sitting, Cuff Size: Normal)   Pulse 83   Ht 5\' 11"  (1.803 m)   Wt 184 lb 6.4 oz (83.6 kg)   SpO2 98%   BMI 25.72 kg/m    Wt Readings from Last 3 Encounters:  07/04/23 184 lb 6.4 oz (83.6 kg)  06/21/23 182 lb (82.6 kg)  07/10/22 190 lb (86.2 kg)     GEN: Well nourished, well developed in no acute distress NECK: No JVD; No carotid bruits CARDIAC: RRR, no  murmurs, rubs, gallops RESPIRATORY:  Clear to auscultation without rales, wheezing or rhonchi  ABDOMEN: Soft, non-tender, non-distended EXTREMITIES:  No edema; No deformity   ASSESSMENT AND PLAN:   CORONARY ARTERY DISEASE -  The patient has no new sypmtoms.  No further cardiovascular testing is indicated.  We will continue with aggressive risk reduction and meds as listed.  HYPERTENSION - His blood pressure is at target.  No change in therapy.  DYSLIPIDEMIA -  His LDL was 48 with an HDL of 46.  No change in therapy.  I have given him written instructions to get an LP(a) at his next blood draw.        Follow up me in one year.   Signed, Rollene Rotunda, MD

## 2023-07-04 ENCOUNTER — Ambulatory Visit: Payer: Medicare Other | Attending: Cardiology | Admitting: Cardiology

## 2023-07-04 ENCOUNTER — Encounter: Payer: Self-pay | Admitting: Cardiology

## 2023-07-04 VITALS — BP 108/64 | HR 83 | Ht 71.0 in | Wt 184.4 lb

## 2023-07-04 DIAGNOSIS — I2510 Atherosclerotic heart disease of native coronary artery without angina pectoris: Secondary | ICD-10-CM | POA: Insufficient documentation

## 2023-07-04 DIAGNOSIS — I10 Essential (primary) hypertension: Secondary | ICD-10-CM | POA: Insufficient documentation

## 2023-07-04 DIAGNOSIS — E785 Hyperlipidemia, unspecified: Secondary | ICD-10-CM | POA: Insufficient documentation

## 2023-07-04 NOTE — Patient Instructions (Signed)
    Follow-Up: At [facility], you and your health needs are our priority.  As part of our continuing mission to provide you with exceptional heart care, we have created designated Provider Care Teams.  These Care Teams include your primary Cardiologist (physician) and Advanced Practice Providers (APPs -  Physician Assistants and Nurse Practitioners) who all work together to provide you with the care you need, when you need it.  We recommend signing up for the patient portal called "MyChart".  Sign up information is provided on this After Visit Summary.  MyChart is used to connect with patients for Virtual Visits (Telemedicine).  Patients are able to view lab/test results, encounter notes, upcoming appointments, etc.  Non-urgent messages can be sent to your provider as well.   To learn more about what you can do with MyChart, go to [email or web address].    Your next appointment:   12 month(s)  Provider:   Rollene Rotunda MD   Other Instructions  LPa with next lab draw

## 2023-07-05 DIAGNOSIS — M9902 Segmental and somatic dysfunction of thoracic region: Secondary | ICD-10-CM | POA: Diagnosis not present

## 2023-07-05 DIAGNOSIS — M5387 Other specified dorsopathies, lumbosacral region: Secondary | ICD-10-CM | POA: Diagnosis not present

## 2023-07-05 DIAGNOSIS — M5442 Lumbago with sciatica, left side: Secondary | ICD-10-CM | POA: Diagnosis not present

## 2023-07-05 DIAGNOSIS — M9901 Segmental and somatic dysfunction of cervical region: Secondary | ICD-10-CM | POA: Diagnosis not present

## 2023-07-09 DIAGNOSIS — M5387 Other specified dorsopathies, lumbosacral region: Secondary | ICD-10-CM | POA: Diagnosis not present

## 2023-07-09 DIAGNOSIS — M9902 Segmental and somatic dysfunction of thoracic region: Secondary | ICD-10-CM | POA: Diagnosis not present

## 2023-07-09 DIAGNOSIS — M5442 Lumbago with sciatica, left side: Secondary | ICD-10-CM | POA: Diagnosis not present

## 2023-07-09 DIAGNOSIS — M9901 Segmental and somatic dysfunction of cervical region: Secondary | ICD-10-CM | POA: Diagnosis not present

## 2023-07-16 ENCOUNTER — Other Ambulatory Visit: Payer: Self-pay | Admitting: Cardiology

## 2023-07-16 DIAGNOSIS — M9901 Segmental and somatic dysfunction of cervical region: Secondary | ICD-10-CM | POA: Diagnosis not present

## 2023-07-16 DIAGNOSIS — M9902 Segmental and somatic dysfunction of thoracic region: Secondary | ICD-10-CM | POA: Diagnosis not present

## 2023-07-16 DIAGNOSIS — M5387 Other specified dorsopathies, lumbosacral region: Secondary | ICD-10-CM | POA: Diagnosis not present

## 2023-07-16 DIAGNOSIS — M5442 Lumbago with sciatica, left side: Secondary | ICD-10-CM | POA: Diagnosis not present

## 2023-07-18 DIAGNOSIS — M9902 Segmental and somatic dysfunction of thoracic region: Secondary | ICD-10-CM | POA: Diagnosis not present

## 2023-07-18 DIAGNOSIS — M9901 Segmental and somatic dysfunction of cervical region: Secondary | ICD-10-CM | POA: Diagnosis not present

## 2023-07-18 DIAGNOSIS — M9903 Segmental and somatic dysfunction of lumbar region: Secondary | ICD-10-CM | POA: Diagnosis not present

## 2023-07-18 DIAGNOSIS — M9905 Segmental and somatic dysfunction of pelvic region: Secondary | ICD-10-CM | POA: Diagnosis not present

## 2023-07-23 DIAGNOSIS — M9905 Segmental and somatic dysfunction of pelvic region: Secondary | ICD-10-CM | POA: Diagnosis not present

## 2023-07-23 DIAGNOSIS — M9903 Segmental and somatic dysfunction of lumbar region: Secondary | ICD-10-CM | POA: Diagnosis not present

## 2023-07-23 DIAGNOSIS — M9901 Segmental and somatic dysfunction of cervical region: Secondary | ICD-10-CM | POA: Diagnosis not present

## 2023-07-23 DIAGNOSIS — M9902 Segmental and somatic dysfunction of thoracic region: Secondary | ICD-10-CM | POA: Diagnosis not present

## 2023-07-25 DIAGNOSIS — M9903 Segmental and somatic dysfunction of lumbar region: Secondary | ICD-10-CM | POA: Diagnosis not present

## 2023-07-25 DIAGNOSIS — M9905 Segmental and somatic dysfunction of pelvic region: Secondary | ICD-10-CM | POA: Diagnosis not present

## 2023-07-25 DIAGNOSIS — M9902 Segmental and somatic dysfunction of thoracic region: Secondary | ICD-10-CM | POA: Diagnosis not present

## 2023-07-25 DIAGNOSIS — M9901 Segmental and somatic dysfunction of cervical region: Secondary | ICD-10-CM | POA: Diagnosis not present

## 2023-07-30 DIAGNOSIS — M9903 Segmental and somatic dysfunction of lumbar region: Secondary | ICD-10-CM | POA: Diagnosis not present

## 2023-07-30 DIAGNOSIS — M9905 Segmental and somatic dysfunction of pelvic region: Secondary | ICD-10-CM | POA: Diagnosis not present

## 2023-07-30 DIAGNOSIS — M9901 Segmental and somatic dysfunction of cervical region: Secondary | ICD-10-CM | POA: Diagnosis not present

## 2023-07-30 DIAGNOSIS — M9902 Segmental and somatic dysfunction of thoracic region: Secondary | ICD-10-CM | POA: Diagnosis not present

## 2023-08-06 DIAGNOSIS — M9905 Segmental and somatic dysfunction of pelvic region: Secondary | ICD-10-CM | POA: Diagnosis not present

## 2023-08-06 DIAGNOSIS — M9903 Segmental and somatic dysfunction of lumbar region: Secondary | ICD-10-CM | POA: Diagnosis not present

## 2023-08-06 DIAGNOSIS — M9902 Segmental and somatic dysfunction of thoracic region: Secondary | ICD-10-CM | POA: Diagnosis not present

## 2023-08-06 DIAGNOSIS — M9901 Segmental and somatic dysfunction of cervical region: Secondary | ICD-10-CM | POA: Diagnosis not present

## 2023-08-27 ENCOUNTER — Other Ambulatory Visit: Payer: Self-pay | Admitting: Cardiology

## 2023-09-09 DIAGNOSIS — M9902 Segmental and somatic dysfunction of thoracic region: Secondary | ICD-10-CM | POA: Diagnosis not present

## 2023-09-09 DIAGNOSIS — M9901 Segmental and somatic dysfunction of cervical region: Secondary | ICD-10-CM | POA: Diagnosis not present

## 2023-09-09 DIAGNOSIS — M9905 Segmental and somatic dysfunction of pelvic region: Secondary | ICD-10-CM | POA: Diagnosis not present

## 2023-09-09 DIAGNOSIS — Z85820 Personal history of malignant melanoma of skin: Secondary | ICD-10-CM | POA: Diagnosis not present

## 2023-09-09 DIAGNOSIS — M9903 Segmental and somatic dysfunction of lumbar region: Secondary | ICD-10-CM | POA: Diagnosis not present

## 2023-09-09 DIAGNOSIS — C44729 Squamous cell carcinoma of skin of left lower limb, including hip: Secondary | ICD-10-CM | POA: Diagnosis not present

## 2023-09-09 DIAGNOSIS — D485 Neoplasm of uncertain behavior of skin: Secondary | ICD-10-CM | POA: Diagnosis not present

## 2023-09-09 DIAGNOSIS — Z85828 Personal history of other malignant neoplasm of skin: Secondary | ICD-10-CM | POA: Diagnosis not present

## 2023-09-10 IMAGING — CT CT ABD-PELV W/ CM
1 of 4 series · 12 of 32 positions shown, 18 images · IV contrast (APPLIED)
Comparison: None.

CLINICAL DATA: Right lower quadrant pain, concern for appendicitis
or hernia

EXAM:
CT ABDOMEN AND PELVIS WITH CONTRAST
TECHNIQUE: Multidetector CT imaging of the abdomen and pelvis was performed
using the standard protocol following bolus administration of
intravenous contrast.
CONTRAST:  100mL CY2LDN-0WW IOPAMIDOL (CY2LDN-0WW) INJECTION 61%

[Series 2: abd/pelvis w/cm · axial · 0.72mm/px · z∈[-483,-43]mm · 12 of 104 slices shown, 18 images]
[im 8/104  soft-tissue]
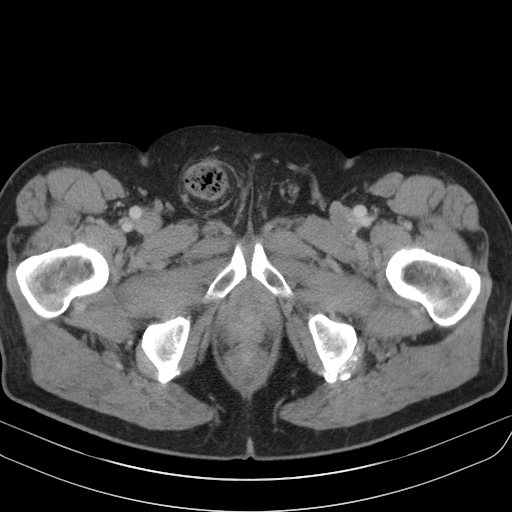
[im 8/104  bone]
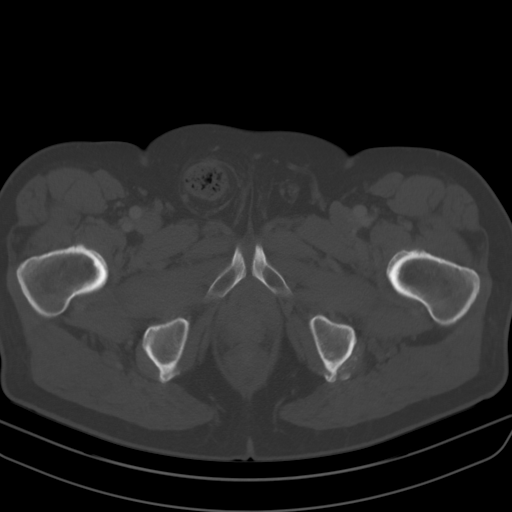
[im 16/104  soft-tissue]
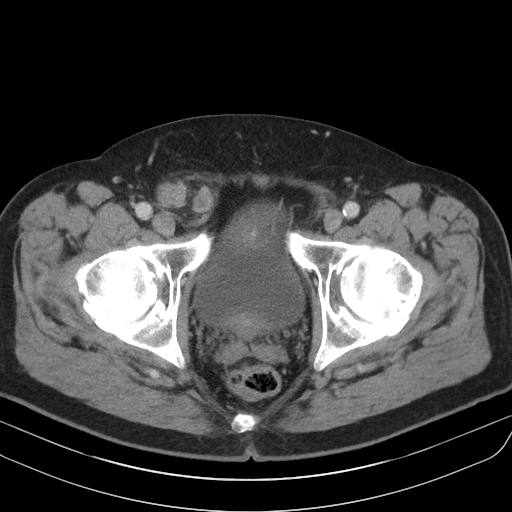
[im 24/104  soft-tissue]
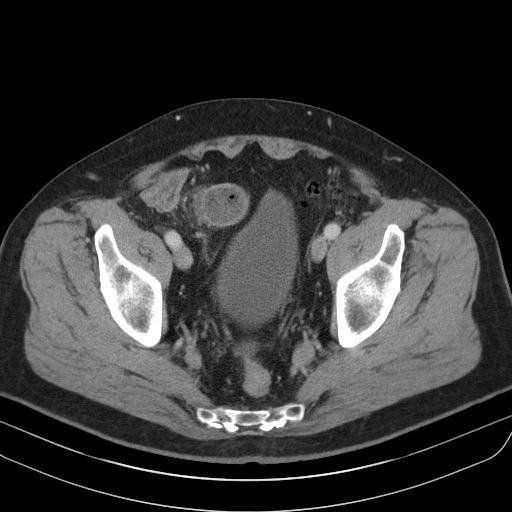
[im 32/104  soft-tissue]
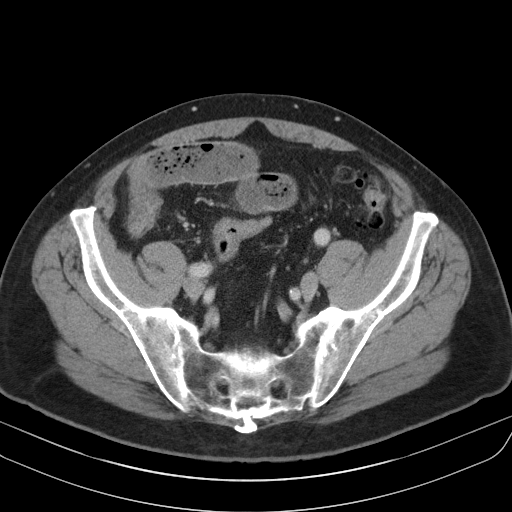
[im 40/104  soft-tissue]
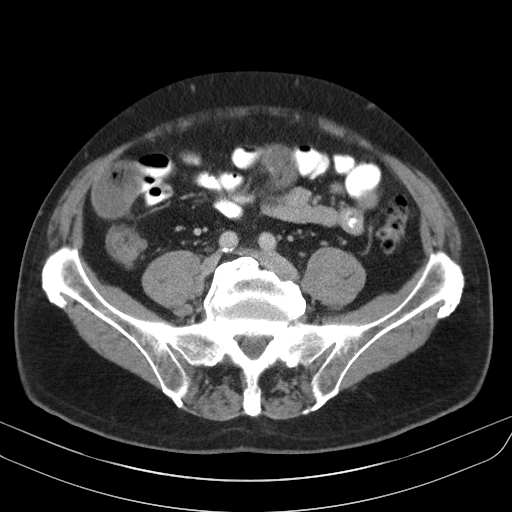
[im 48/104  soft-tissue]
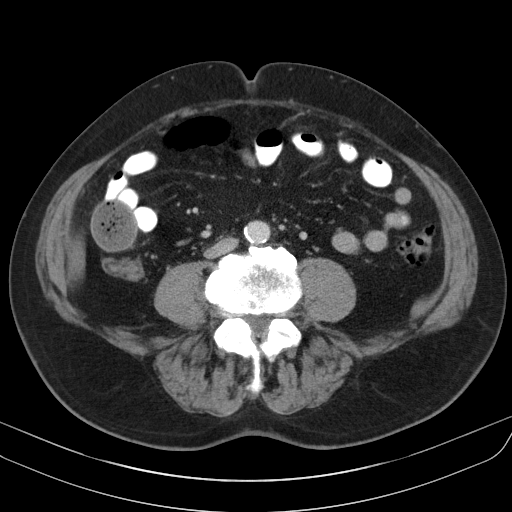
[im 56/104  soft-tissue]
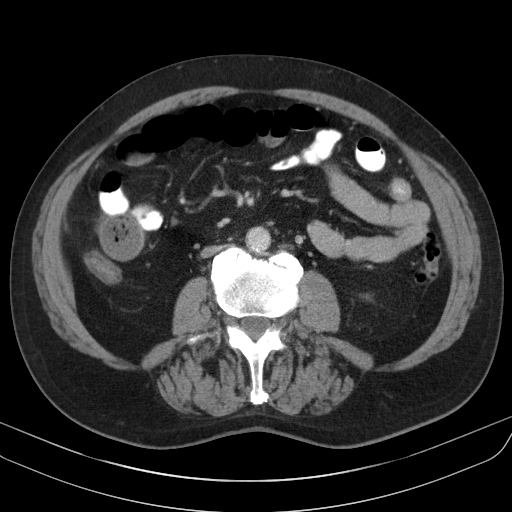
[im 64/104  soft-tissue]
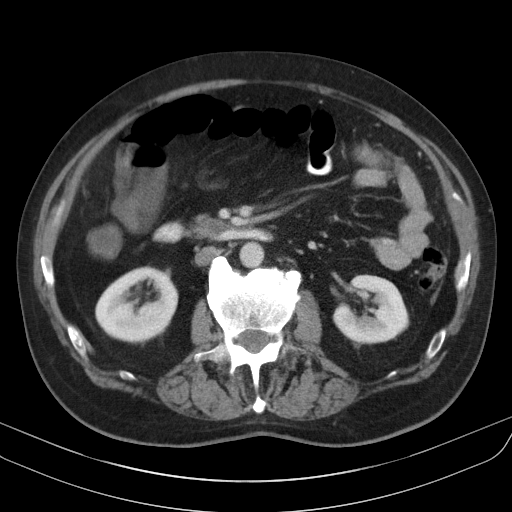
[im 72/104  soft-tissue]
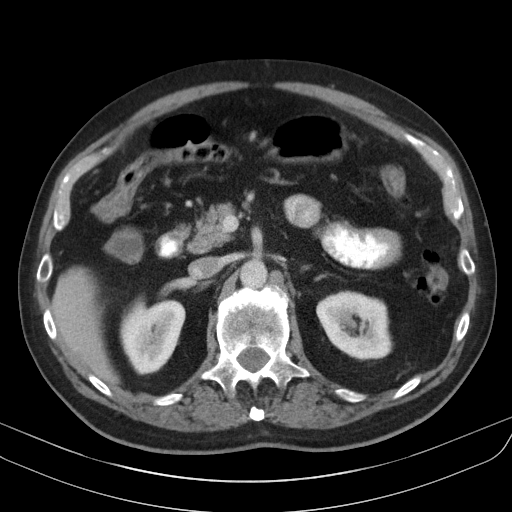
[im 72/104  lung]
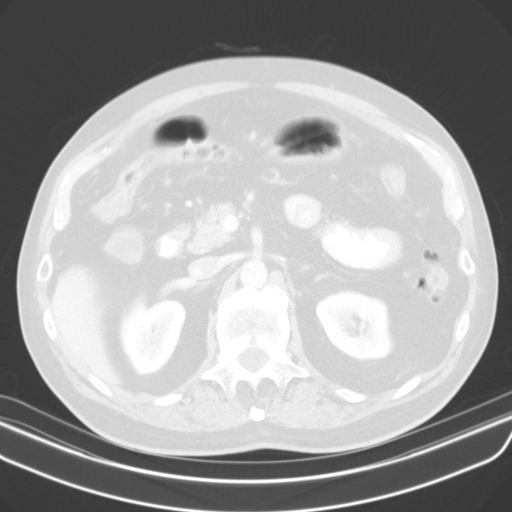
[im 72/104  bone]
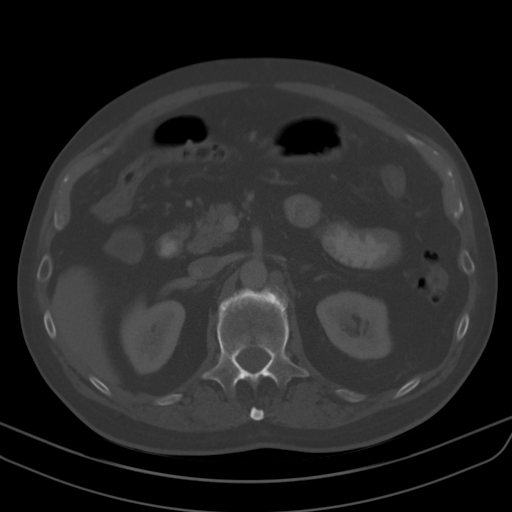
[im 80/104  soft-tissue]
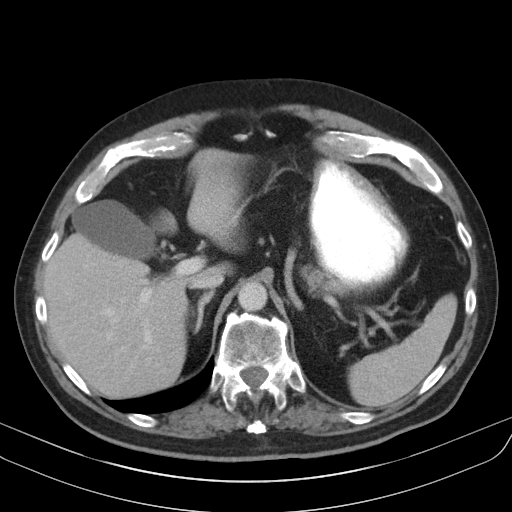
[im 80/104  lung]
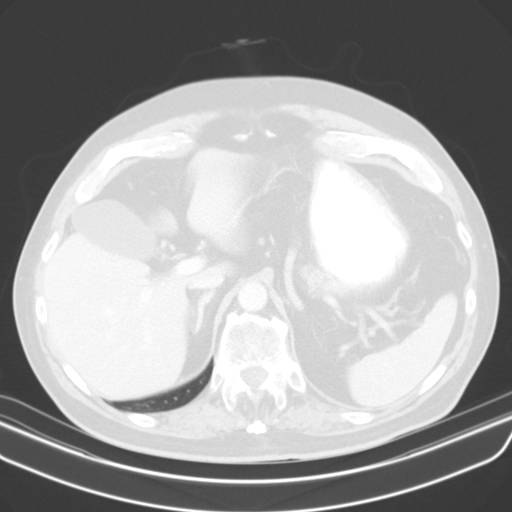
[im 88/104  soft-tissue]
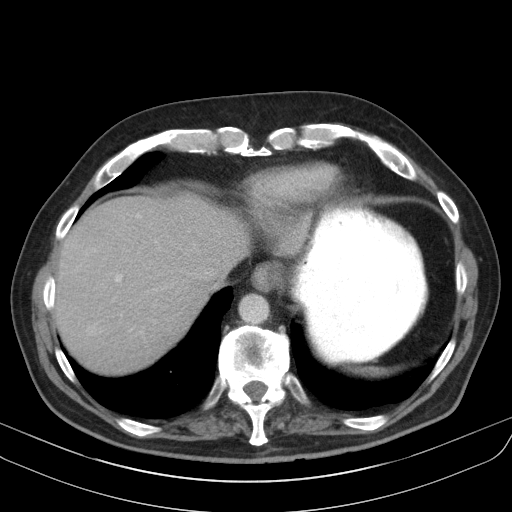
[im 88/104  lung]
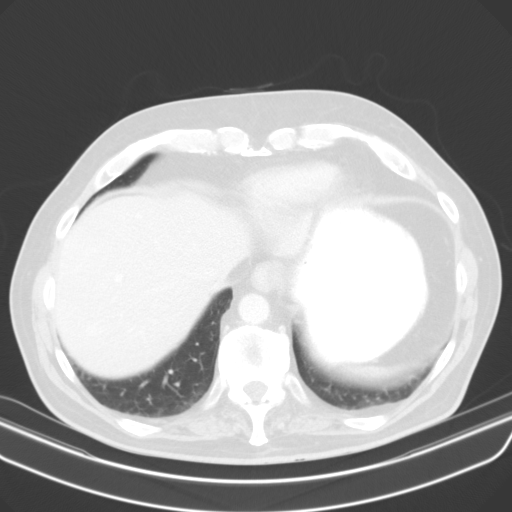
[im 96/104  soft-tissue]
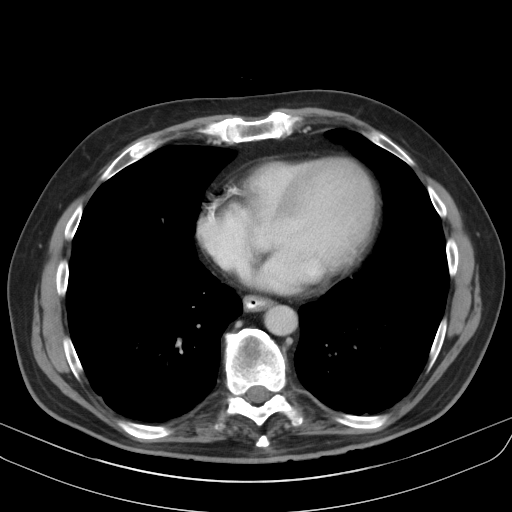
[im 96/104  lung]
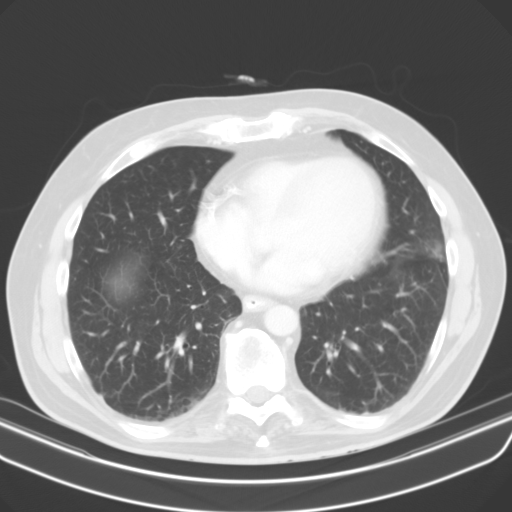

[12 of 32 positions shown; findings below may reference images not displayed]

FINDINGS: Lower chest: There is minimal dependent subsegmental atelectasis in
the lung bases. The imaged heart is unremarkable.

Hepatobiliary: The liver and gallbladder are unremarkable. There is
no biliary ductal dilatation.

Pancreas: Unremarkable.

Spleen: Unremarkable.

Adrenals/Urinary Tract: The adrenals are unremarkable.

The kidneys are unremarkable, with no focal lesion, stone,
hydronephrosis, or hydroureter. The bladder is unremarkable.

Stomach/Bowel: The stomach is significantly distended with enteric
contrast. There is refluxed enteric contrast into the esophagus. The
terminal ileum is herniated into the right inguinal canal with
dilation of the upstream small bowel with the largest loop measuring
up to 3.3 cm in diameter. There is fecalized material within this
dilated small bowel. There is no pneumatosis intestinalis.

There is extensive colonic diverticulosis without evidence of acute
diverticulitis.

Vascular/Lymphatic: There is scattered calcified atherosclerotic
plaque throughout the nonaneurysmal abdominal aorta. The major
branch vessels are patent. The main portal and splenic veins are
patent. There is no portal venous gas. There is no abdominal or
pelvic lymphadenopathy.

Reproductive: The prostate and seminal vesicles are unremarkable.

Other: There is trace free fluid in the right lower quadrant, likely
reactive (2-77). There is no free intraperitoneal air.

Musculoskeletal: There is multilevel degenerative change throughout
the imaged spine with fusion of the vertebral bodies anteriorly.
There is also partial fusion of the SI joints.
IMPRESSION: 1. The distal ileum is herniated into the inguinal canal with
evidence of incarceration and small-bowel obstruction as above. No
pneumatosis intestinalis or portal venous gas.
2. Diverticulosis without evidence of acute diverticulitis.
3. Osseous findings above suspicious for ankylosing spondylitis.

These results were called by telephone at the time of interpretation
on 09/12/2021 at [DATE] to provider Dr Ronlor , who verbally
acknowledged these results.

## 2023-09-23 DIAGNOSIS — M9905 Segmental and somatic dysfunction of pelvic region: Secondary | ICD-10-CM | POA: Diagnosis not present

## 2023-09-23 DIAGNOSIS — M9901 Segmental and somatic dysfunction of cervical region: Secondary | ICD-10-CM | POA: Diagnosis not present

## 2023-09-23 DIAGNOSIS — M9902 Segmental and somatic dysfunction of thoracic region: Secondary | ICD-10-CM | POA: Diagnosis not present

## 2023-09-23 DIAGNOSIS — M9903 Segmental and somatic dysfunction of lumbar region: Secondary | ICD-10-CM | POA: Diagnosis not present

## 2023-10-04 DEATH — deceased

## 2023-10-21 DIAGNOSIS — M9902 Segmental and somatic dysfunction of thoracic region: Secondary | ICD-10-CM | POA: Diagnosis not present

## 2023-10-21 DIAGNOSIS — M9903 Segmental and somatic dysfunction of lumbar region: Secondary | ICD-10-CM | POA: Diagnosis not present

## 2023-10-21 DIAGNOSIS — M9901 Segmental and somatic dysfunction of cervical region: Secondary | ICD-10-CM | POA: Diagnosis not present

## 2023-10-21 DIAGNOSIS — M9905 Segmental and somatic dysfunction of pelvic region: Secondary | ICD-10-CM | POA: Diagnosis not present

## 2023-11-18 DIAGNOSIS — M9902 Segmental and somatic dysfunction of thoracic region: Secondary | ICD-10-CM | POA: Diagnosis not present

## 2023-11-18 DIAGNOSIS — M9905 Segmental and somatic dysfunction of pelvic region: Secondary | ICD-10-CM | POA: Diagnosis not present

## 2023-11-18 DIAGNOSIS — M9903 Segmental and somatic dysfunction of lumbar region: Secondary | ICD-10-CM | POA: Diagnosis not present

## 2023-11-18 DIAGNOSIS — M9901 Segmental and somatic dysfunction of cervical region: Secondary | ICD-10-CM | POA: Diagnosis not present

## 2023-11-19 ENCOUNTER — Telehealth: Payer: Self-pay | Admitting: Cardiology

## 2023-11-19 NOTE — Telephone Encounter (Signed)
Patient stated he had his right hip replaced and will need to have left hip replaced down the road.  Patient stated he stated taking fish oil supplements which has helped his range of motion but had heard that this supplement can lead to heart issues/stroke.  Patient wants to know if it is safe for him to take the fish oil supplements.

## 2023-11-20 NOTE — Telephone Encounter (Signed)
Patient identification verified by 2 forms. Marilynn Rail, RN    Called and spoke to patient  Informed patient per Dr. Tenna Child to take fish oil supplements  Patient verbalized understanding, no questions at this time

## 2023-12-23 DIAGNOSIS — M5387 Other specified dorsopathies, lumbosacral region: Secondary | ICD-10-CM | POA: Diagnosis not present

## 2023-12-23 DIAGNOSIS — M5442 Lumbago with sciatica, left side: Secondary | ICD-10-CM | POA: Diagnosis not present

## 2023-12-23 DIAGNOSIS — M9902 Segmental and somatic dysfunction of thoracic region: Secondary | ICD-10-CM | POA: Diagnosis not present

## 2023-12-23 DIAGNOSIS — M9901 Segmental and somatic dysfunction of cervical region: Secondary | ICD-10-CM | POA: Diagnosis not present

## 2023-12-30 ENCOUNTER — Ambulatory Visit (INDEPENDENT_AMBULATORY_CARE_PROVIDER_SITE_OTHER): Payer: Medicare Other | Admitting: Internal Medicine

## 2023-12-30 ENCOUNTER — Encounter: Payer: Self-pay | Admitting: Internal Medicine

## 2023-12-30 VITALS — BP 112/74 | HR 85 | Temp 98.7°F | Ht 71.0 in | Wt 195.0 lb

## 2023-12-30 DIAGNOSIS — J0140 Acute pansinusitis, unspecified: Secondary | ICD-10-CM | POA: Diagnosis not present

## 2023-12-30 MED ORDER — BENZONATATE 200 MG PO CAPS: 200.0000 mg | ORAL_CAPSULE | Freq: Three times a day (TID) | ORAL | 0 refills | Status: DC | PRN

## 2023-12-30 MED ORDER — AMOXICILLIN-POT CLAVULANATE 875-125 MG PO TABS: 1.0000 | ORAL_TABLET | Freq: Two times a day (BID) | ORAL | 0 refills | Status: DC

## 2023-12-30 NOTE — Assessment & Plan Note (Signed)
Seems to have started with typical viral infection---now 3 weeks and has worsened Presumed sinus infection Analgesics Will give benzonatate for prn Augmentin 875 bid x 7 days

## 2023-12-30 NOTE — Progress Notes (Signed)
Subjective:    Patient ID: Juan [name], male    DOB: 1951/05/01, 73 y.o.   MRN: [id number]  HPI Here due to respiratory infection  Symptoms started about 3 weeks ago Typical cold symptoms--- cough, etc Thought he was getting better---then worsened in past few days Cough is worse now--mostly during the day (okay at night) No fever Feels chilled--no sweats or shakes No SOB No headache Some sore throat No ear pain Considerable post nasal drip  Used alka seltzer plus ----may have helped (only once)  Current Outpatient Medications on File Prior to Visit  Medication Sig Dispense Refill   acetaminophen (TYLENOL) 325 MG tablet Take 650 mg by mouth every 6 (six) hours as needed for headache, mild pain or fever.     aspirin EC 81 MG tablet Take 81 mg by mouth daily.     atorvastatin (LIPITOR) 20 MG tablet TAKE 1 TABLET BY MOUTH EVERY DAY 90 tablet 3   ramipril (ALTACE) 2.5 MG capsule TAKE 1 CAPSULE BY MOUTH EVERY DAY 90 capsule 3   No current facility-administered medications on file prior to visit.    No Known Allergies  Past Medical History:  Diagnosis Date   CAD (coronary artery disease)    a. RCA occlusion with 2 drug-eluting stents 2004;  b. 90% stenosis distal to the stents in the same artery treated with drug-eluting stenting February 2012;  .c 12/2013 Cath: nonobs dzs.   Complication of anesthesia    Diverticulosis of colon    GERD (gastroesophageal reflux disease)    Hyperlipidemia    Hypertension    Melanoma (HCC)    Myocardial infarction (HCC) 2004   PONV (postoperative nausea and vomiting)    Age 51- not since    Past Surgical History:  Procedure Laterality Date   COLONOSCOPY     ESOPHAGOSCOPY N/A 05/31/2015   Procedure: ESOPHAGOSCOPY;  Surgeon: Serena Colonel, MD;  Location: [facility];  Service: ENT;  Laterality: N/A;   INGUINAL HERNIA REPAIR Right 09/12/2021   Procedure: HERNIA REPAIR INGUINAL ADULT;  Surgeon: Diamantina Monks, MD;  Location: [facility] OR;  Service:  General;  Laterality: Right;   LEFT HEART CATHETERIZATION WITH CORONARY ANGIOGRAM N/A 12/14/2013   Procedure: LEFT HEART CATHETERIZATION WITH CORONARY ANGIOGRAM;  Surgeon: Lesleigh Noe, MD;  Location: [facility] CATH LAB;  Service: Cardiovascular;  Laterality: N/A;   MELANOMA EXCISION     3 seperate ones   PTCA  12/03/2002   TOTAL HIP ARTHROPLASTY Right 12/31/2022   wisdom teeth removal      Family History  Problem Relation Age of Onset   Stroke Mother    Hypertension Mother    Heart attack Father 31       died   Hypertension Brother    Cancer Brother        prostate   Brain cancer Brother    Colon cancer Neg Hx    Pancreatic cancer Neg Hx    Stomach cancer Neg Hx    Esophageal cancer Neg Hx    Rectal cancer Neg Hx    Diabetes Neg Hx     Social History   Socioeconomic History   Marital status: Married    Spouse name: Not on file   Number of children: 1   Years of education: Not on file   Highest education level: Not on file  Occupational History   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  Tobacco Use   Smoking status: Never  Passive exposure: Never   Smokeless tobacco: Never  Vaping Use   Vaping status: Never Used  Substance and Sexual Activity   Alcohol use: Yes    Alcohol/week: 1.0 standard drink of alcohol    Types: 1 Cans of beer per week   Drug use: No   Sexual activity: Not on file  Other Topics Concern   Not on file  Social History Narrative   No living will or health care POA   Would want wife to make decisions--then daughter   Would accept resuscitation attempts   Would accept at least short term trial of tube feeds   Social Drivers of Corporate investment banker Strain: Not on file  Food Insecurity: Not on file  Transportation Needs: Not on file  Physical Activity: Not on file  Stress: Not on file  Social Connections: Not on file  Intimate Partner Violence: Not on file   Review of Systems      Objective:   Physical Exam Constitutional:      Appearance: Normal appearance.  HENT:     Head:     Comments: No sinus tenderness    Left Ear: Tympanic membrane and ear canal normal.     Ears:     Comments: Cerumen in right canal    Mouth/Throat:     Comments: Slight pharyngeal injection Pulmonary:     Effort: Pulmonary effort is normal.     Breath sounds: Normal breath sounds. No wheezing or rales.  Musculoskeletal:     Cervical back: Neck supple.  Lymphadenopathy:     Cervical: No cervical adenopathy.  Neurological:     Mental Status: He is alert.            Assessment & Plan:

## 2024-01-22 DIAGNOSIS — D0472 Carcinoma in situ of skin of left lower limb, including hip: Secondary | ICD-10-CM | POA: Diagnosis not present

## 2024-01-22 DIAGNOSIS — L570 Actinic keratosis: Secondary | ICD-10-CM | POA: Diagnosis not present

## 2024-01-22 DIAGNOSIS — L821 Other seborrheic keratosis: Secondary | ICD-10-CM | POA: Diagnosis not present

## 2024-01-22 DIAGNOSIS — L111 Transient acantholytic dermatosis [Grover]: Secondary | ICD-10-CM | POA: Diagnosis not present

## 2024-01-22 DIAGNOSIS — D485 Neoplasm of uncertain behavior of skin: Secondary | ICD-10-CM | POA: Diagnosis not present

## 2024-01-22 DIAGNOSIS — Z85820 Personal history of malignant melanoma of skin: Secondary | ICD-10-CM | POA: Diagnosis not present

## 2024-01-22 DIAGNOSIS — Z85828 Personal history of other malignant neoplasm of skin: Secondary | ICD-10-CM | POA: Diagnosis not present

## 2024-01-22 DIAGNOSIS — L812 Freckles: Secondary | ICD-10-CM | POA: Diagnosis not present

## 2024-01-27 DIAGNOSIS — M9901 Segmental and somatic dysfunction of cervical region: Secondary | ICD-10-CM | POA: Diagnosis not present

## 2024-01-27 DIAGNOSIS — M9903 Segmental and somatic dysfunction of lumbar region: Secondary | ICD-10-CM | POA: Diagnosis not present

## 2024-01-27 DIAGNOSIS — M9902 Segmental and somatic dysfunction of thoracic region: Secondary | ICD-10-CM | POA: Diagnosis not present

## 2024-01-27 DIAGNOSIS — M9905 Segmental and somatic dysfunction of pelvic region: Secondary | ICD-10-CM | POA: Diagnosis not present

## 2024-02-04 DIAGNOSIS — M25552 Pain in left hip: Secondary | ICD-10-CM | POA: Diagnosis not present

## 2024-02-04 DIAGNOSIS — Z5189 Encounter for other specified aftercare: Secondary | ICD-10-CM | POA: Diagnosis not present

## 2024-02-12 DIAGNOSIS — M25552 Pain in left hip: Secondary | ICD-10-CM | POA: Diagnosis not present

## 2024-05-07 DIAGNOSIS — R972 Elevated prostate specific antigen [PSA]: Secondary | ICD-10-CM | POA: Diagnosis not present

## 2024-05-12 DIAGNOSIS — R972 Elevated prostate specific antigen [PSA]: Secondary | ICD-10-CM | POA: Diagnosis not present

## 2024-05-12 DIAGNOSIS — N5089 Other specified disorders of the male genital organs: Secondary | ICD-10-CM | POA: Diagnosis not present

## 2024-06-11 DIAGNOSIS — H2513 Age-related nuclear cataract, bilateral: Secondary | ICD-10-CM | POA: Diagnosis not present

## 2024-06-17 DIAGNOSIS — M25552 Pain in left hip: Secondary | ICD-10-CM | POA: Diagnosis not present

## 2024-06-22 ENCOUNTER — Ambulatory Visit (INDEPENDENT_AMBULATORY_CARE_PROVIDER_SITE_OTHER): Payer: Medicare Other | Admitting: Internal Medicine

## 2024-06-22 ENCOUNTER — Encounter: Payer: Self-pay | Admitting: Internal Medicine

## 2024-06-22 ENCOUNTER — Ambulatory Visit: Payer: Self-pay | Admitting: Internal Medicine

## 2024-06-22 VITALS — BP 104/64 | HR 85 | Temp 97.7°F | Ht 70.25 in | Wt 182.0 lb

## 2024-06-22 DIAGNOSIS — I10 Essential (primary) hypertension: Secondary | ICD-10-CM

## 2024-06-22 DIAGNOSIS — Z1211 Encounter for screening for malignant neoplasm of colon: Secondary | ICD-10-CM

## 2024-06-22 DIAGNOSIS — I2510 Atherosclerotic heart disease of native coronary artery without angina pectoris: Secondary | ICD-10-CM | POA: Diagnosis not present

## 2024-06-22 DIAGNOSIS — R1319 Other dysphagia: Secondary | ICD-10-CM

## 2024-06-22 DIAGNOSIS — Z0000 Encounter for general adult medical examination without abnormal findings: Secondary | ICD-10-CM

## 2024-06-22 LAB — COMPREHENSIVE METABOLIC PANEL WITH GFR
ALT: 19 U/L (ref 0–53)
AST: 14 U/L (ref 0–37)
Albumin: 4.2 g/dL (ref 3.5–5.2)
Alkaline Phosphatase: 71 U/L (ref 39–117)
BUN: 13 mg/dL (ref 6–23)
CO2: 32 meq/L (ref 19–32)
Calcium: 9.2 mg/dL (ref 8.4–10.5)
Chloride: 101 meq/L (ref 96–112)
Creatinine, Ser: 0.88 mg/dL (ref 0.40–1.50)
GFR: 84.43 mL/min (ref >60.00)
Glucose, Bld: 101 mg/dL — ABNORMAL HIGH (ref 70–99)
Potassium: 4.5 meq/L (ref 3.5–5.1)
Sodium: 139 meq/L (ref 135–145)
Total Bilirubin: 0.8 mg/dL (ref 0.2–1.2)
Total Protein: 6.9 g/dL (ref 6.0–8.3)

## 2024-06-22 LAB — LIPID PANEL
Cholesterol: 126 mg/dL (ref 0–200)
HDL: 49.1 mg/dL (ref >39.00)
LDL Cholesterol: 50 mg/dL (ref 0–99)
NonHDL: 77.31
Total CHOL/HDL Ratio: 3
Triglycerides: 136 mg/dL (ref 0.0–149.0)
VLDL: 27.2 mg/dL (ref 0.0–40.0)

## 2024-06-22 LAB — CBC
HCT: 46.3 % (ref 39.0–52.0)
Hemoglobin: 14.8 g/dL (ref 13.0–17.0)
MCHC: 31.9 g/dL (ref 30.0–36.0)
MCV: 84.1 fl (ref 78.0–100.0)
Platelets: 286 K/uL (ref 150.0–400.0)
RBC: 5.51 Mil/uL (ref 4.22–5.81)
RDW: 15 % (ref 11.5–15.5)
WBC: 10.6 K/uL — ABNORMAL HIGH (ref 4.0–10.5)

## 2024-06-22 NOTE — Progress Notes (Signed)
 Subjective:    Patient ID: Juan [name], male    DOB: 04-14-1951, 73 y.o.   MRN: [id number]  HPI Here for Medicare wellness visit and follow up of chronic health conditions Reviewed advanced directives Reviewed other doctors---Dr Evans---urology, Dr Andrea, Dr Bevely, Dr Rodulfo--chiropractic, Dr Truett, [facility], Dr Hochrein--cardiology No hospitalizations or surgery in the past year Not really exercising Rare beer/bourbon No tobacco No falls No depression or anhedonia Independent with instrumental ADLs No sig memory issues  Has had an awareness of his left arm No pain---no particular time (not exertional or after eating, etc) No change in sensation Some past left shoulder issues---not obvious now  No chest pain No SOB No exercise--not golfing much (hip and heat, etc) Some lightheaded feeling---very slight (like after getting up from bending over). No syncope  Voids okay Continues to monitor PSA at urologist  No recent dysphagia No heartburn  Current Outpatient Medications on File Prior to Visit  Medication Sig Dispense Refill   acetaminophen  (TYLENOL ) 325 MG tablet Take 650 mg by mouth every 6 (six) hours as needed for headache, mild pain or fever.     aspirin  EC 81 MG tablet Take 81 mg by mouth daily.     atorvastatin  (LIPITOR) 20 MG tablet TAKE 1 TABLET BY MOUTH EVERY DAY 90 tablet 3   ramipril  (ALTACE ) 2.5 MG capsule TAKE 1 CAPSULE BY MOUTH EVERY DAY 90 capsule 3   No current facility-administered medications on file prior to visit.    No Known Allergies  Past Medical History:  Diagnosis Date   CAD (coronary artery disease)    a. RCA occlusion with 2 drug-eluting stents 2004;  b. 90% stenosis distal to the stents in the same artery treated with drug-eluting stenting February 2012;  .c 12/2013 Cath: nonobs dzs.   Complication of anesthesia    Diverticulosis of colon    GERD (gastroesophageal reflux disease)    Hyperlipidemia     Hypertension    Melanoma (HCC)    Myocardial infarction (HCC) 2004   PONV (postoperative nausea and vomiting)    Age 10- not since    Past Surgical History:  Procedure Laterality Date   COLONOSCOPY     ESOPHAGOSCOPY N/A 05/31/2015   Procedure: ESOPHAGOSCOPY;  Surgeon: Ida Loader, MD;  Location: [facility] OR;  Service: ENT;  Laterality: N/A;   INGUINAL HERNIA REPAIR Right 09/12/2021   Procedure: HERNIA REPAIR INGUINAL ADULT;  Surgeon: Paola Dreama SAILOR, MD;  Location: [facility] OR;  Service: General;  Laterality: Right;   LEFT HEART CATHETERIZATION WITH CORONARY ANGIOGRAM N/A 12/14/2013   Procedure: LEFT HEART CATHETERIZATION WITH CORONARY ANGIOGRAM;  Surgeon: Victory LELON Claudene DOUGLAS, MD;  Location: [facility] CATH LAB;  Service: Cardiovascular;  Laterality: N/A;   MELANOMA EXCISION     3 seperate ones   PTCA  12/03/2002   TOTAL HIP ARTHROPLASTY Right 12/31/2022   wisdom teeth removal      Family History  Problem Relation Age of Onset   Stroke Mother    Hypertension Mother    Heart attack Father 4       died   Hypertension Brother    Cancer Brother        prostate   Brain cancer Brother    Colon cancer Neg Hx    Pancreatic cancer Neg Hx    Stomach cancer Neg Hx    Esophageal cancer Neg Hx    Rectal cancer Neg Hx    Diabetes Neg Hx  Social History   Socioeconomic History   Marital status: Married    Spouse name: Not on file   Number of children: 1   Years of education: Not on file   Highest education level: Not on file  Occupational History   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  Tobacco Use   Smoking status: Never    Passive exposure: Never   Smokeless tobacco: Never  Vaping Use   Vaping status: Never Used  Substance and Sexual Activity   Alcohol use: Yes    Alcohol/week: 1.0 standard drink of alcohol    Types: 1 Cans of beer per week   Drug use: No   Sexual activity: Not on file  Other Topics Concern   Not on file  Social History  Narrative   No living will or health care POA   Would want wife to make decisions--then daughter   Would accept resuscitation attempts   Would accept at least short term trial of tube feeds   Social Drivers of Corporate investment banker Strain: Not on file  Food Insecurity: Not on file  Transportation Needs: Not on file  Physical Activity: Not on file  Stress: Not on file  Social Connections: Not on file  Intimate Partner Violence: Not on file   Review of Systems Appetite is good Weight fairly stable Sleeps fairly well Wears seat belt Teeth are okay--keeps up with dentist Has stopped the chiropractic--was not really finding it helpful. Just uses tylenol  for back/joint pains No suspicious skin lesions--does see derm Bowels move fine     Objective:   Physical Exam Constitutional:      Appearance: Normal appearance.  HENT:     Mouth/Throat:     Pharynx: No oropharyngeal exudate or posterior oropharyngeal erythema.  Eyes:     Conjunctiva/sclera: Conjunctivae normal.     Pupils: Pupils are equal, round, and reactive to light.  Cardiovascular:     Rate and Rhythm: Normal rate and regular rhythm.     Pulses: Normal pulses.     Heart sounds: No murmur heard.    No gallop.  Pulmonary:     Effort: Pulmonary effort is normal.     Breath sounds: Normal breath sounds. No wheezing or rales.  Abdominal:     Palpations: Abdomen is soft.     Tenderness: There is no abdominal tenderness.  Musculoskeletal:     Cervical back: Neck supple.     Right lower leg: No edema.     Left lower leg: No edema.  Lymphadenopathy:     Cervical: No cervical adenopathy.  Skin:    Findings: No lesion or rash.  Neurological:     General: No focal deficit present.     Mental Status: He is alert and oriented to person, place, and time.     Comments: Mini-cog---normal  Psychiatric:        Mood and Affect: Mood normal.        Behavior: Behavior normal.            Assessment & Plan:

## 2024-06-22 NOTE — Progress Notes (Signed)
 Hearing Screening - Comments:: Passed whisper test Vision Screening - Comments:: July 2025

## 2024-06-22 NOTE — Assessment & Plan Note (Signed)
 I have personally reviewed the Medicare Annual Wellness questionnaire and have noted 1. The patient's medical and social history 2. Their use of alcohol, tobacco or illicit drugs 3. Their current medications and supplements 4. The patient's functional ability including ADL's, fall risks, home safety risks and hearing or visual             impairment. 5. Diet and physical activities 6. Evidence for depression or mood disorders  The patients weight, height, BMI and visual acuity have been recorded in the chart I have made referrals, counseling and provided education to the patient based review of the above and I have provided the pt with a written personalized care plan for preventive services.  I have provided you with a copy of your personalized plan for preventive services. Please take the time to review along with your updated medication list.  Didn't do colon--will send cologuard Probably can stop PSAs---goes to urologist Discussed exercise Prefers no flu/COVID vaccines Due for Td at pharmacy

## 2024-06-22 NOTE — Assessment & Plan Note (Signed)
Better now No meds

## 2024-06-22 NOTE — Assessment & Plan Note (Signed)
 BP Readings from Last 3 Encounters:  06/22/24 104/64  12/30/23 112/74  07/04/23 108/64   Fine on low dose ramipril  2.5 mg daily

## 2024-06-22 NOTE — Assessment & Plan Note (Signed)
 Vague arm symptoms don't sound cardiac (shoulder okay --just mild decrease in external rotation---and normal strength in arm) On ASA, atorvastatin  and ramipril 

## 2024-07-22 DIAGNOSIS — Z85820 Personal history of malignant melanoma of skin: Secondary | ICD-10-CM | POA: Diagnosis not present

## 2024-07-22 DIAGNOSIS — L853 Xerosis cutis: Secondary | ICD-10-CM | POA: Diagnosis not present

## 2024-07-22 DIAGNOSIS — C44622 Squamous cell carcinoma of skin of right upper limb, including shoulder: Secondary | ICD-10-CM | POA: Diagnosis not present

## 2024-07-22 DIAGNOSIS — L821 Other seborrheic keratosis: Secondary | ICD-10-CM | POA: Diagnosis not present

## 2024-07-22 DIAGNOSIS — L570 Actinic keratosis: Secondary | ICD-10-CM | POA: Diagnosis not present

## 2024-07-22 DIAGNOSIS — D485 Neoplasm of uncertain behavior of skin: Secondary | ICD-10-CM | POA: Diagnosis not present

## 2024-07-22 DIAGNOSIS — L111 Transient acantholytic dermatosis [Grover]: Secondary | ICD-10-CM | POA: Diagnosis not present

## 2024-07-22 DIAGNOSIS — Z85828 Personal history of other malignant neoplasm of skin: Secondary | ICD-10-CM | POA: Diagnosis not present

## 2024-07-22 DIAGNOSIS — D045 Carcinoma in situ of skin of trunk: Secondary | ICD-10-CM | POA: Diagnosis not present

## 2024-07-26 ENCOUNTER — Other Ambulatory Visit: Payer: Self-pay | Admitting: Cardiology

## 2024-08-04 NOTE — Progress Notes (Unsigned)
  Cardiology Office Note:   Date:  08/06/2024  ID:  Juan [name], DOB 1951/01/19, MRN [id number] PCP: Jimmy Charlie FERNS, MD  [facility] Providers Cardiologist:  Lynwood Schilling, MD {  History of Present Illness:   Juan [name] is a 73 y.o. male who presents for follow up of CAD.   In 2004 he had 2 drug-eluting stents to an occluded RCA.  He had 90% stenosis distal to the stents treated with drug-eluting stent in February 2012.  Last catheter 2015 demonstrated nonobstructive disease. He had some vague symptoms when I saw him and he was to have a POET (Plain Old Exercise Treadmill).  However, he did not have this.  Since I last saw him he had THR.    Since I last saw him he has done well.  The patient denies any new symptoms such as chest discomfort, neck or arm discomfort. There has been no new shortness of breath, PND or orthopnea. There have been no reported palpitations, presyncope or syncope.  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     ROS: As stated in the HPI and negative for all other systems.   Studies Reviewed:    EKG:   EKG Interpretation Date/Time:  Thursday August 06 2024 10:51:45 EDT Ventricular Rate:  80 PR Interval:  196 QRS Duration:  74 QT Interval:  354 QTC Calculation: 408 R Axis:   -1  Text Interpretation: Normal sinus rhythm Normal ECG When compared with ECG of 04-Jul-2023 10:16, No significant change was found Confirmed by Schilling Lynwood ([id number]) on 08/06/2024 10:57:27 AM     Risk Assessment/Calculations:              Physical Exam:   VS:  BP 120/64   Pulse 80   Ht 5' 11 (1.803 m)   Wt 188 lb (85.3 kg)   SpO2 97%   BMI 26.22 kg/m    Wt Readings from Last 3 Encounters:  08/06/24 188 lb (85.3 kg)  06/22/24 182 lb (82.6 kg)  12/30/23 195 lb (88.5 kg)     GEN: Well nourished, well developed in no acute distress NECK: No JVD; No carotid bruits CARDIAC: RRR, no murmurs, rubs, gallops RESPIRATORY:  Clear to auscultation without rales, wheezing  or rhonchi  ABDOMEN: Soft, non-tender, non-distended EXTREMITIES:  No edema; No deformity   ASSESSMENT AND PLAN:   CORONARY ARTERY DISEASE -  The patient has no new sypmtoms.  No further cardiovascular testing is indicated.  We will continue with aggressive risk reduction and meds as listed.   HYPERTENSION - His blood pressure is at target.  No change in therapy.   DYSLIPIDEMIA -  His LDL was 50.  Continue current therapy.   PREOP:   The patient might be having hip surgery.  He has high functional level.  He has no high risk findings.  He is not going for high risk procedure.  Therefore, based on ACC/AHA guidelines no further cardiovascular testing is suggested prior to surgery if he has it in the next 3 to 6 months.  If not he would have to call us  for preop clearance if it is delayed beyond that.     Follow up with me in one year  Signed, Lynwood Schilling, MD

## 2024-08-06 ENCOUNTER — Ambulatory Visit: Attending: Cardiology | Admitting: Cardiology

## 2024-08-06 ENCOUNTER — Encounter: Payer: Self-pay | Admitting: Cardiology

## 2024-08-06 VITALS — BP 120/64 | HR 80 | Ht 71.0 in | Wt 188.0 lb

## 2024-08-06 DIAGNOSIS — I2510 Atherosclerotic heart disease of native coronary artery without angina pectoris: Secondary | ICD-10-CM | POA: Insufficient documentation

## 2024-08-06 DIAGNOSIS — E785 Hyperlipidemia, unspecified: Secondary | ICD-10-CM | POA: Insufficient documentation

## 2024-08-06 DIAGNOSIS — I10 Essential (primary) hypertension: Secondary | ICD-10-CM | POA: Diagnosis not present

## 2024-08-06 NOTE — Patient Instructions (Signed)

## 2024-08-24 DIAGNOSIS — Z1211 Encounter for screening for malignant neoplasm of colon: Secondary | ICD-10-CM | POA: Diagnosis not present

## 2024-08-26 ENCOUNTER — Other Ambulatory Visit: Payer: Self-pay | Admitting: Cardiology

## 2024-08-27 ENCOUNTER — Telehealth (HOSPITAL_BASED_OUTPATIENT_CLINIC_OR_DEPARTMENT_OTHER): Payer: Self-pay | Admitting: *Deleted

## 2024-08-27 NOTE — Telephone Encounter (Signed)
     Primary Cardiologist: Lynwood Schilling, MD  Chart reviewed as part of pre-operative protocol coverage. Given past medical history and time since last visit, based on ACC/AHA guidelines, Juan [name] would be at acceptable risk for the planned procedure without further cardiovascular testing.   He was recently seen in cardiology clinic and was felt to be acceptable risk for upcoming hip surgery.  He is able to complete greater than 4 METS of physical activity.  Regarding ASA therapy, we recommend continuation of ASA throughout the perioperative period. However, if the surgeon feels that cessation of ASA is required in the perioperative period, it may be stopped 5-7 days prior to surgery with a plan to resume it as soon as felt to be feasible from a surgical standpoint in the post-operative period.    I will route this recommendation to the requesting party via Epic fax function and remove from pre-op pool.  Please call with questions.  Josefa HERO. Chela Sutphen NP-C     08/27/2024, 4:47 PM [facility] [address] [place], [place] [address] Office [phone]

## 2024-08-27 NOTE — Telephone Encounter (Signed)
   Pre-operative Risk Assessment    Patient Name: Juan [name]  DOB: May 17, 1951 MRN: [id number]   Date of last office visit: 08/06/24 DR. HOCHRIEN Date of next office visit: NONE   Request for Surgical Clearance    Procedure:  LEFT TOTAL HIP ARTHROPLASTY  Date of Surgery:  Clearance 10/13/24                                Surgeon:  DR. DEMPSEY MOAN Surgeon's Group or Practice Name:  [facility] Phone number:  [phone] ATTN: KERRI MAZE Fax number:  [phone]   Type of Clearance Requested:   - Medical  - Pharmacy:  Hold Aspirin      Type of Anesthesia:  CHOICE   Additional requests/questions:    Bonney Niels Jest   08/27/2024, 4:21 PM

## 2024-08-28 ENCOUNTER — Telehealth: Payer: Self-pay

## 2024-08-28 LAB — COLOGUARD: COLOGUARD: NEGATIVE

## 2024-08-28 NOTE — Telephone Encounter (Signed)
 I am fine with seeing him for surgical clearance

## 2024-08-28 NOTE — Telephone Encounter (Signed)
 Received a Pre-Op Clearance form from [facility] for Left Total Hip Replacement tentatively scheduled for 10-13-24. Pt had AWV with Dr Jimmy 06-22-24 along with labs. The form is requesting Labs, CXR, and EKG. Cardiology clearance was done without them seeing pt.   Can you see him for a surgical clearance?  I have the form at my desk.

## 2024-09-28 DIAGNOSIS — M25552 Pain in left hip: Secondary | ICD-10-CM | POA: Diagnosis not present

## 2024-10-06 ENCOUNTER — Ambulatory Visit (INDEPENDENT_AMBULATORY_CARE_PROVIDER_SITE_OTHER): Admitting: Nurse Practitioner

## 2024-10-06 VITALS — BP 122/72 | HR 75 | Temp 97.8°F | Ht 71.0 in | Wt 194.8 lb

## 2024-10-06 DIAGNOSIS — I2510 Atherosclerotic heart disease of native coronary artery without angina pectoris: Secondary | ICD-10-CM | POA: Diagnosis not present

## 2024-10-06 DIAGNOSIS — I10 Essential (primary) hypertension: Secondary | ICD-10-CM | POA: Diagnosis not present

## 2024-10-06 DIAGNOSIS — Z01818 Encounter for other preprocedural examination: Secondary | ICD-10-CM | POA: Diagnosis not present

## 2024-10-06 DIAGNOSIS — I252 Old myocardial infarction: Secondary | ICD-10-CM | POA: Diagnosis not present

## 2024-10-06 NOTE — Assessment & Plan Note (Signed)
 Followed by cardiology status post 2 stents maintained on statin and baby aspirin 

## 2024-10-06 NOTE — Progress Notes (Signed)
 Established Patient Office Visit  Subjective   Patient ID: Juan [name], male    DOB: 1951-08-11  Age: 73 y.o. MRN: [id number]  Chief Complaint  Patient presents with   surgical clearance    Left hip replacement scheduled for next Tuesday.     HPI   With a history of HTN, CAD, OA, BPH, melanoma, HLD  He is followed by Dr. Lynwood Schilling, cardiology. Last EKG was performed on 08/06/2024 Patient has a history of MI along with CAD with 2 stents placed.  Patient is scheduled to undergo a left hip arthroplasty under the direction of Dr. Dempsey Moan with [facility] on 10/13/2024  States that he has done well in the recent  past with anesthesia.  Patient mentions that when he was 15 he had some trouble. Patient denies family or personal history of malignant hyperthermia.       Review of Systems  Constitutional:  Negative for chills and fever.  Respiratory:  Negative for shortness of breath.   Cardiovascular:  Negative for chest pain and leg swelling.  Gastrointestinal:  Negative for abdominal pain, blood in stool, constipation, diarrhea, nausea and vomiting.  Genitourinary:  Negative for dysuria and hematuria.       Nocturia x2  Neurological:  Negative for tingling and headaches.  Psychiatric/Behavioral:  Negative for hallucinations and suicidal ideas.       Objective:     BP 122/72   Pulse 75   Temp 97.8 F (36.6 C) (Oral)   Ht 5' 11 (1.803 m)   Wt 194 lb 12.8 oz (88.4 kg)   SpO2 99%   BMI 27.17 kg/m    Physical Exam Vitals and nursing note reviewed.  Constitutional:      Appearance: Normal appearance.  HENT:     Right Ear: Ear canal and external ear normal. There is impacted cerumen.     Left Ear: Tympanic membrane, ear canal and external ear normal.     Mouth/Throat:     Mouth: Mucous membranes are moist.     Pharynx: Oropharynx is clear.  Eyes:     Extraocular Movements: Extraocular movements intact.     Pupils: Pupils are equal, round, and  reactive to light.  Cardiovascular:     Rate and Rhythm: Normal rate and regular rhythm.     Pulses: Normal pulses.     Heart sounds: Normal heart sounds.  Pulmonary:     Effort: Pulmonary effort is normal.     Breath sounds: Normal breath sounds.  Abdominal:     General: Bowel sounds are normal. There is no distension.     Palpations: There is no mass.     Tenderness: There is no abdominal tenderness.     Hernia: No hernia is present.  Musculoskeletal:     Right lower leg: No edema.     Left lower leg: No edema.  Lymphadenopathy:     Cervical: No cervical adenopathy.  Skin:    General: Skin is warm.  Neurological:     General: No focal deficit present.     Mental Status: He is alert.     Comments: Bilateral upper and lower extremity strength 5/5  Psychiatric:        Mood and Affect: Mood normal.        Behavior: Behavior normal.        Thought Content: Thought content normal.        Judgment: Judgment normal.      No results found  for any visits on 10/06/24.    The ASCVD Risk score (Arnett DK, et al., 2019) failed to calculate for the following reasons:   Risk score cannot be calculated because patient has a medical history suggesting prior/existing ASCVD    Assessment & Plan:   Problem List Items Addressed This Visit       Cardiovascular and Mediastinum   Essential hypertension, benign   Currently maintained on ramipril  2.5 mg daily.  Blood pressure controlled.  Patient is followed by cardiology continue      Coronary atherosclerosis of native coronary artery   History of same followed by cardiology most recent lipid panel good with LDL of 50.  Patient last evaluated by cardiology in September of this year.  Did review most recent EKG        Other   Pre-operative clearance - Primary   Low risk.  Patient is cleared medically did review most recent labs done in July of this year.  Patient states he had some recently done at [facility] at the request of anesthesia  unable to review those.      History of MI (myocardial infarction)   Followed by cardiology status post 2 stents maintained on statin and baby aspirin        Return if symptoms worsen or fail to improve, for As scheduled with Dr. Bennett.    Adina Crandall, NP

## 2024-10-06 NOTE — Assessment & Plan Note (Signed)
 Currently maintained on ramipril  2.5 mg daily.  Blood pressure controlled.  Patient is followed by cardiology continue

## 2024-10-06 NOTE — Assessment & Plan Note (Signed)
 History of same followed by cardiology most recent lipid panel good with LDL of 50.  Patient last evaluated by cardiology in September of this year.  Did review most recent EKG

## 2024-10-06 NOTE — Patient Instructions (Signed)
 Nice to see you today I will get the form completed and sent over to fax today  Follow up with Dr. Bennett as scheduled or sooner if you need us 

## 2024-10-06 NOTE — Assessment & Plan Note (Signed)
 Low risk.  Patient is cleared medically did review most recent labs done in July of this year.  Patient states he had some recently done at [facility] at the request of anesthesia unable to review those.

## 2024-10-13 DIAGNOSIS — M1612 Unilateral primary osteoarthritis, left hip: Secondary | ICD-10-CM | POA: Diagnosis not present

## 2024-11-25 ENCOUNTER — Other Ambulatory Visit: Payer: Self-pay | Admitting: Cardiology

## 2025-06-23 ENCOUNTER — Encounter
